# Patient Record
Sex: Female | Born: 1955 | Race: Black or African American | Hispanic: Yes | Marital: Married | Smoking: Former smoker
Health system: Southern US, Community
[De-identification: ages and names within clinical notes are randomized; demographics above are authoritative.]

## PROBLEM LIST (undated history)

## (undated) DIAGNOSIS — I639 Cerebral infarction, unspecified: Secondary | ICD-10-CM

## (undated) DIAGNOSIS — R51 Headache: Secondary | ICD-10-CM

## (undated) DIAGNOSIS — Z9889 Other specified postprocedural states: Secondary | ICD-10-CM

## (undated) DIAGNOSIS — I6529 Occlusion and stenosis of unspecified carotid artery: Secondary | ICD-10-CM

## (undated) DIAGNOSIS — R238 Other skin changes: Secondary | ICD-10-CM

## (undated) DIAGNOSIS — R42 Dizziness and giddiness: Secondary | ICD-10-CM

## (undated) DIAGNOSIS — E785 Hyperlipidemia, unspecified: Secondary | ICD-10-CM

## (undated) DIAGNOSIS — G459 Transient cerebral ischemic attack, unspecified: Secondary | ICD-10-CM

## (undated) DIAGNOSIS — R233 Spontaneous ecchymoses: Secondary | ICD-10-CM

## (undated) DIAGNOSIS — R112 Nausea with vomiting, unspecified: Secondary | ICD-10-CM

## (undated) HISTORY — PX: OTHER SURGICAL HISTORY: SHX169

## (undated) HISTORY — DX: Cerebral infarction, unspecified: I63.9

## (undated) HISTORY — DX: Occlusion and stenosis of unspecified carotid artery: I65.29

## (undated) HISTORY — PX: COLONOSCOPY: SHX174

---

## 1978-11-14 HISTORY — PX: DILATION AND CURETTAGE OF UTERUS: SHX78

## 2000-11-14 HISTORY — PX: ABDOMINAL HYSTERECTOMY: SHX81

## 2007-01-03 ENCOUNTER — Ambulatory Visit (HOSPITAL_BASED_OUTPATIENT_CLINIC_OR_DEPARTMENT_OTHER): Admission: RE | Admit: 2007-01-03 | Discharge: 2007-01-03 | Payer: Self-pay | Admitting: *Deleted

## 2007-03-19 ENCOUNTER — Ambulatory Visit: Payer: Self-pay | Admitting: Pain Medicine

## 2007-03-29 ENCOUNTER — Ambulatory Visit: Payer: Self-pay | Admitting: Pain Medicine

## 2007-04-05 ENCOUNTER — Ambulatory Visit: Payer: Self-pay | Admitting: Physician Assistant

## 2007-05-07 ENCOUNTER — Ambulatory Visit: Payer: Self-pay | Admitting: Physician Assistant

## 2007-05-22 ENCOUNTER — Ambulatory Visit: Payer: Self-pay | Admitting: Pain Medicine

## 2007-06-05 ENCOUNTER — Ambulatory Visit: Payer: Self-pay | Admitting: Pain Medicine

## 2007-06-20 ENCOUNTER — Ambulatory Visit: Payer: Self-pay | Admitting: Pain Medicine

## 2008-01-15 ENCOUNTER — Ambulatory Visit: Payer: Self-pay | Admitting: Vascular Surgery

## 2008-07-29 ENCOUNTER — Ambulatory Visit: Payer: Self-pay | Admitting: Vascular Surgery

## 2009-04-07 ENCOUNTER — Ambulatory Visit: Payer: Self-pay | Admitting: Vascular Surgery

## 2009-11-26 ENCOUNTER — Ambulatory Visit: Payer: Self-pay | Admitting: Vascular Surgery

## 2010-07-02 ENCOUNTER — Ambulatory Visit: Payer: Self-pay | Admitting: Vascular Surgery

## 2010-12-29 ENCOUNTER — Other Ambulatory Visit: Payer: Self-pay

## 2011-03-29 NOTE — Procedures (Signed)
CAROTID DUPLEX EXAM   INDICATION:  Followup possible FMD.   HISTORY:  Diabetes:  no  Cardiac:  no  Hypertension:  no  Smoking:  Previous  Previous Surgery:  no  CV History:  Asymptomatic; the patient hears blood flow in ears.  Amaurosis Fugax No, Paresthesias No, Hemiparesis No                                       RIGHT             LEFT  Brachial systolic pressure:         112               110  Brachial Doppler waveforms:         wnl               wnl  Vertebral direction of flow:        Antegrade         Antegrade  DUPLEX VELOCITIES (cm/sec)  CCA peak systolic                   96                112  ECA peak systolic                   108               101  ICA peak systolic                   P = 102, M/D = 278                  P = 58, M/D = 286  ICA end diastolic                   P = 54, M/D = 103 P = 22, M/D = 108  PLAQUE MORPHOLOGY:  PLAQUE AMOUNT:                      none              none  PLAQUE LOCATION:   IMPRESSION:  1. Bilateral internal carotid artery velocities are suggestive of high      end 60% to 79% stenosis however no plaque visualized.  2. Bilateral internal carotid artery velocities obtained today were      increased compared to previous.  3. Findings suggestive of fibromuscular dysplasia as previously      documented.   ___________________________________________  Di Kindle. Edilia Bo, M.D.   AS/MEDQ  D:  11/26/2009  T:  11/26/2009  Job:  [id number]

## 2011-03-29 NOTE — Assessment & Plan Note (Signed)
OFFICE VISIT   [name] [name], [name] [name]  DOB:  07-Mar-1956                                       07/29/2008  ZOXWR#:[id number]   I saw the patient in the office today for continued followup of her  carotid disease.  I had initially seen her in consultation on 01/15/2008  with a moderate right carotid stenosis in the 40-59% range.  I had  recommended a followup duplex scan in 6 months.  She was asymptomatic.   She returns for her 6 month followup visit.  She has had no history of  stroke, TIAs, expressive or receptive aphasia or amaurosis fugax.   REVIEW OF SYSTEMS:  On review of systems she has had no recent chest  pain, chest pressure, palpitations or arrhythmias.  She has had no  bronchitis, asthma or wheezing.   PHYSICAL EXAMINATION:  General:  On physical examination this is a  pleasant 55 year old woman who appears her stated age.  Vital signs:  Her blood pressure is 127/84, heart rate is 80.  Neck:  The neck is  supple.  There is no cervical lymphadenopathy.  I do not detect any  carotid bruits.  Lungs:  Are clear bilaterally to auscultation.  Cardiac:  She has a regular rate and rhythm.  Abdomen:  Soft, nontender.  Neurological:  Exam is nonfocal.   Carotid duplex scan shows that she has bilateral 40-59% carotid  stenoses.  The velocities on the left have increased compared to the  study 6 months ago.   I have explained we generally would not consider carotid endarterectomy  unless the stenoses progressed to greater than 80%.  I have recommended  followup duplex scan in 6 months and I will see her back at that time.  She knows to call sooner if she has problems.  In the meantime she knows  to continue taking her aspirin.   Di Kindle. Edilia Bo, M.D.  Electronically Signed   CSD/MEDQ  D:  07/29/2008  T:  07/31/2008  Job:  [id number]
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
## 2011-03-29 NOTE — Procedures (Signed)
CAROTID DUPLEX EXAM   INDICATION:  Follow up known carotid artery disease (FMD).   HISTORY:  Diabetes:  No.  Cardiac:  No.  Hypertension:  No.  Smoking:  Quit.  Previous Surgery:  CV History:  Amaurosis Fugax No, Paresthesias No, Hemiparesis No.                                       RIGHT             LEFT  Brachial systolic pressure:         116               114  Brachial Doppler waveforms:         Biphasic          Biphasic  Vertebral direction of flow:        Antegrade         Antegrade  DUPLEX VELOCITIES (cm/sec)  CCA peak systolic                   133               140  ECA peak systolic                   133               117  ICA peak systolic                   229 (mid)         176  ICA end diastolic                   88                71  PLAQUE MORPHOLOGY:                  None              None  PLAQUE AMOUNT:                      None              None  PLAQUE LOCATION:                    None              None   IMPRESSION:  1. 60-79% stenosis noted in the right internal carotid artery.  2. 40-59% stenosis noted in the left internal carotid artery.  3. Findings consistent with FMD since no plaque was seen.  4. Antegrade bilateral vertebral arteries.   ___________________________________________  Di Kindle. Edilia Bo, M.D.   MG/MEDQ  D:  04/07/2009  T:  04/07/2009  Job:  [id number]

## 2011-03-29 NOTE — Procedures (Signed)
CAROTID DUPLEX EXAM   INDICATION:  Follow up carotid disease consistent with fibromuscular  dysplasia.   HISTORY:  Diabetes:  No.  Cardiac:  No.  Hypertension:  No.  Smoking:  Previous.  Previous Surgery:  No.  CV History:  Complaint of occasional dizziness while at work.  Amaurosis Fugax No, Paresthesias No, Hemiparesis No                                       RIGHT             LEFT  Brachial systolic pressure:         110               110  Brachial Doppler waveforms:         Normal            Normal  Vertebral direction of flow:        Antegrade         Antegrade  DUPLEX VELOCITIES (cm/sec)  CCA peak systolic                   104               103  ECA peak systolic                   80                73  ICA peak systolic                   289 (mid)         275 (mid)  ICA end diastolic                   124               124  PLAQUE MORPHOLOGY:  PLAQUE AMOUNT:                      None              None  PLAQUE LOCATION:   IMPRESSION:  1. Doppler velocities suggest an 80-99% stenosis of the bilateral mid      internal carotid arteries.  This increase in velocity is most      likely due to possible fibromuscular dysplasia since no significant      plaque formation could be adequately visualized.  2. Increase in the percentage of stenosis category noted in the      bilateral internal carotid arteries when compared to the previous      exam on 11/26/2009.   ___________________________________________  Di Kindle. Edilia Bo, M.D.   CH/MEDQ  D:  07/05/2010  T:  07/05/2010  Job:  [id number]

## 2011-03-29 NOTE — Procedures (Signed)
CAROTID DUPLEX EXAM   INDICATION:  Carotid disease.   HISTORY:  Diabetes:  No.  Cardiac:  No.  Hypertension:  No.  Smoking:  Previous.  Previous Surgery:  No.  CV History:  No.  Amaurosis Fugax No, Paresthesias No, Hemiparesis No.                                       RIGHT             LEFT  Brachial systolic pressure:         128               120  Brachial Doppler waveforms:         Normal            Normal  Vertebral direction of flow:        Antegrade         Antegrade  DUPLEX VELOCITIES (cm/sec)  CCA peak systolic                   116               99  ECA peak systolic                   68                71  ICA peak systolic                   177               182  ICA end diastolic                   75                78  PLAQUE MORPHOLOGY:                  None              None  PLAQUE AMOUNT:                      None              None  PLAQUE LOCATION:                    None              None   IMPRESSION:  Doppler velocities suggest a 40-59% stenosis of the  bilateral mid internal carotid arteries; however, no focal plaque  formations were adequately visualized.  Color Doppler flow  characteristics suggest possible fibromuscular dysplasia.      ___________________________________________  Di Kindle. Edilia Bo, M.D.   CH/MEDQ  D:  07/29/2008  T:  07/29/2008  Job:  [id number]

## 2011-04-01 NOTE — Op Note (Signed)
NAMEZALMA, CHANNING             ACCOUNT NO.:  [id number]   MEDICAL RECORD NO.:  [id number]          PATIENT TYPE:  AMB   LOCATION:  DSC                          FACILITY:  [facility]   PHYSICIAN:  Tennis Must Meyerdierks, M.D.DATE OF BIRTH:  August 24, 1956   DATE OF PROCEDURE:  01/03/2007  DATE OF DISCHARGE:                               OPERATIVE REPORT   PREOPERATIVE DIAGNOSIS:  Extension contracture, left small finger.   POSTOPERATIVE DIAGNOSIS:  Extension contracture, left small finger.   PROCEDURE:  Extensor tenolysis with MP capsulotomy, left small finger.   SURGEON:  Lowell Bouton, M.D.   ANESTHESIA:  Axillary block.   OPERATIVE FINDINGS:  The patient had significant adherence of her  extensor mechanism to the plate on her fifth metacarpal.  The MP joint  was significantly scarred however, the surface appeared smooth.   PROCEDURE:  Under axillary block anesthesia, with a tourniquet on the  left arm, the left hand was prepped and draped in usual fashion and  after exsanguinating the limb, the tourniquet was inflated to 250 mmHg.  The previous incision was made longitudinally over the ulnar dorsal  aspect of the fifth metacarpal and then extended in a radial direction  at the MP joint across the MP joint dorsally.  Bleeding points were  coagulated and dissection was carried through the subcutaneous tissues.  There was significant scarring overlying the plate and this was released  sharply with a knife.  The extensor mechanism was freed up along the  entire length of the plate and then out to the proximal phalanx.  A  Freer elevator was used to completely free the extensor mechanism up and  performed the tenolysis.  This allowed improvement in the PIP motion;  however, the MP was totally stiff in extension.  A capsulotomy was then  performed sharply with a knife at the MP joint.  With manual flexion,  the adhesions were released at the MP joint and a Freer elevator  was  placed in the MP joint, making sure to release the volar recess.  This  allowed for full flexion of the MP joint and the PIP joint.  The wound  was then irrigated copiously with saline.  The subcutaneous tissue was  closed with 4-0 Vicryl and the skin with a 3-0 subcuticular Prolene.  Steri-Strips were applied followed by sterile dressings and a dorsal  splint with the MPs flexed.  The tourniquet was released with good  circulation of the hand.  The patient went to recovery room awake and  stable in good condition.      Lowell Bouton, M.D.  Electronically Signed     EMM/MEDQ  D:  01/03/2007  T:  01/03/2007  Job:  [id number]   cc:   Dr. Evelena Asa

## 2011-12-26 DIAGNOSIS — R072 Precordial pain: Secondary | ICD-10-CM

## 2012-08-06 ENCOUNTER — Other Ambulatory Visit: Payer: Self-pay | Admitting: *Deleted

## 2012-08-06 DIAGNOSIS — I6529 Occlusion and stenosis of unspecified carotid artery: Secondary | ICD-10-CM

## 2012-08-09 ENCOUNTER — Encounter: Payer: Self-pay | Admitting: Neurosurgery

## 2012-08-10 ENCOUNTER — Encounter: Payer: Self-pay | Admitting: Neurosurgery

## 2012-08-10 ENCOUNTER — Ambulatory Visit (INDEPENDENT_AMBULATORY_CARE_PROVIDER_SITE_OTHER): Payer: Managed Care, Other (non HMO) | Admitting: Neurosurgery

## 2012-08-10 ENCOUNTER — Other Ambulatory Visit (INDEPENDENT_AMBULATORY_CARE_PROVIDER_SITE_OTHER): Payer: Managed Care, Other (non HMO) | Admitting: *Deleted

## 2012-08-10 VITALS — BP 103/72 | HR 86 | Resp 16 | Ht 62.0 in | Wt 148.2 lb

## 2012-08-10 DIAGNOSIS — I7789 Other specified disorders of arteries and arterioles: Secondary | ICD-10-CM

## 2012-08-10 DIAGNOSIS — I773 Arterial fibromuscular dysplasia: Secondary | ICD-10-CM

## 2012-08-10 DIAGNOSIS — I6529 Occlusion and stenosis of unspecified carotid artery: Secondary | ICD-10-CM | POA: Insufficient documentation

## 2012-08-10 NOTE — Progress Notes (Signed)
[facility] Carotid Office Note  CC: Six-month carotid surveillance Referring Physician: Edilia [name]  History of Present Illness: 56 year old female patient of Dr. Adele [name] with known fibromuscular dysplasia bilaterally. The patient denies any signs or symptoms of CVA, TIA, amaurosis fugax or any neural deficit.  Past Medical History  Diagnosis Date  . Carotid artery occlusion     ROS: [x]  Positive   [ ]  Denies    General: [ ]  Weight loss, [ ]  Fever, [ ]  chills Neurologic: [ ]  Dizziness, [ ]  Blackouts, [ ]  Seizure [ ]  Stroke, [ ]  "Mini stroke", [ ]  Slurred speech, [ ]  Temporary blindness; [ ]  weakness in arms or legs, [ ]  Hoarseness Cardiac: [ ]  Chest pain/pressure, [ ]  Shortness of breath at rest [ ]  Shortness of breath with exertion, [ ]  Atrial fibrillation or irregular heartbeat Vascular: [ ]  Pain in legs with walking, [ ]  Pain in legs at rest, [ ]  Pain in legs at night,  [ ]  Non-healing ulcer, [ ]  Blood clot in vein/DVT,   Pulmonary: [ ]  Home oxygen, [ ]  Productive cough, [ ]  Coughing up blood, [ ]  Asthma,  [ ]  Wheezing Musculoskeletal:  [ ]  Arthritis, [ ]  Low back pain, [ ]  Joint pain Hematologic: [ ]  Easy Bruising, [ ]  Anemia; [ ]  Hepatitis Gastrointestinal: [ ]  Blood in stool, [ ]  Gastroesophageal Reflux/heartburn, [ ]  Trouble swallowing Urinary: [ ]  chronic Kidney disease, [ ]  on HD - [ ]  MWF or [ ]  TTHS, [ ]  Burning with urination, [ ]  Difficulty urinating Skin: [ ]  Rashes, [ ]  Wounds Psychological: [ ]  Anxiety, [ ]  Depression   Social History History  Substance Use Topics  . Smoking status: Never Smoker   . Smokeless tobacco: Not on file  . Alcohol Use: No    Family History Family History  Problem Relation Age of Onset  . Cancer Father     Prostate    Not on File  Current Outpatient Prescriptions  Medication Sig Dispense Refill  . aspirin 81 MG tablet Take 81 mg by mouth daily.      Marland Kitchen atorvastatin (LIPITOR) 40 MG tablet 40 mg  daily.       . Calcium Carbonate-Vitamin D (CALTRATE 600+D PO) Take by mouth daily.        Physical Examination  Filed Vitals:   08/10/12 1152  BP: 103/72  Pulse: 86  Resp:     Body mass index is 27.11 kg/(m^2).  General:  WDWN in NAD Gait: Normal HEENT: WNL Eyes: Pupils equal Pulmonary: normal non-labored breathing , without Rales, rhonchi,  wheezing Cardiac: RRR, without  Murmurs, rubs or gallops; Abdomen: soft, NT, no masses Skin: no rashes, ulcers noted  Vascular Exam Pulses: 3+ radial pulses bilaterally Carotid bruits: Carotid pulses to auscultation no bruits are heard Extremities without ischemic changes, no Gangrene , no cellulitis; no open wounds;  Musculoskeletal: no muscle wasting or atrophy   Neurologic: A&O X 3; Appropriate Affect ; SENSATION: normal; MOTOR FUNCTION:  moving all extremities equally. Speech is fluent/normal  Non-Invasive Vascular Imaging CAROTID DUPLEX 08/10/2012  Right ICA 60 - 79 % stenosis Left ICA 60 - 79 % stenosis Noted to be fibromuscular dysplasia, slight decrease bilaterally from previous exam  ASSESSMENT/PLAN: Asymptomatic patient with bilateral fibromuscular dysplasia. The patient will followup in 6 months with repeat carotid duplex. The patient's questions were encouraged and answered, she is in agreement with this plan.  Lauree Chandler ANP   Clinic MD:  Imogene Burn

## 2012-08-10 NOTE — Addendum Note (Signed)
Addended by: Sharee Pimple on: 08/10/2012 12:39 PM   Modules accepted: Orders

## 2013-02-06 ENCOUNTER — Ambulatory Visit: Payer: Managed Care, Other (non HMO) | Admitting: Neurosurgery

## 2013-02-06 ENCOUNTER — Other Ambulatory Visit: Payer: Managed Care, Other (non HMO)

## 2013-02-08 ENCOUNTER — Ambulatory Visit: Payer: Managed Care, Other (non HMO) | Admitting: Neurosurgery

## 2013-02-08 ENCOUNTER — Other Ambulatory Visit (INDEPENDENT_AMBULATORY_CARE_PROVIDER_SITE_OTHER): Payer: Managed Care, Other (non HMO) | Admitting: *Deleted

## 2013-02-08 DIAGNOSIS — I773 Arterial fibromuscular dysplasia: Secondary | ICD-10-CM

## 2013-02-08 DIAGNOSIS — I7789 Other specified disorders of arteries and arterioles: Secondary | ICD-10-CM

## 2013-02-11 ENCOUNTER — Other Ambulatory Visit: Payer: Self-pay | Admitting: *Deleted

## 2013-02-11 DIAGNOSIS — I7789 Other specified disorders of arteries and arterioles: Secondary | ICD-10-CM

## 2013-02-12 ENCOUNTER — Encounter: Payer: Self-pay | Admitting: Vascular Surgery

## 2013-08-14 ENCOUNTER — Other Ambulatory Visit (HOSPITAL_COMMUNITY): Payer: Self-pay | Admitting: Neurosurgery

## 2013-08-14 ENCOUNTER — Ambulatory Visit: Payer: Managed Care, Other (non HMO) | Admitting: Vascular Surgery

## 2013-08-14 ENCOUNTER — Other Ambulatory Visit: Payer: Managed Care, Other (non HMO)

## 2013-08-14 ENCOUNTER — Other Ambulatory Visit: Payer: Self-pay | Admitting: Neurosurgery

## 2013-08-14 DIAGNOSIS — D496 Neoplasm of unspecified behavior of brain: Secondary | ICD-10-CM

## 2013-08-27 ENCOUNTER — Encounter (HOSPITAL_COMMUNITY): Payer: Self-pay

## 2013-08-28 NOTE — Pre-Procedure Instructions (Signed)
Homer Pfeifer  08/28/2013   Your procedure is scheduled on: Tues, Oct 28 @ 7:30 AM  Report to [facility] Short Stay Entrance A at 5:30 AM.  Call this number if you have problems the morning of surgery: [phone]   Remember:   Do not eat food or drink liquids after midnight.   Take these medicines the morning of surgery with A SIP OF WATER: Keppra(Leviracetam)               Stop taking your Aspirin.No Goody's,BC's,Aleve,Ibuprofen,Fish Oil,or any Herbal Medications   Do not wear jewelry, make-up or nail polish.  Do not wear lotions, powders, or perfumes. You may wear deodorant.  Do not shave 48 hours prior to surgery.   Do not bring valuables to the hospital.  [facility] is not responsible                  for any belongings or valuables.               Contacts, dentures or bridgework may not be worn into surgery.  Leave suitcase in the car. After surgery it may be brought to your room.  For patients admitted to the hospital, discharge time is determined by your                treatment team.                   Special Instructions: Shower using CHG 2 nights before surgery and the night before surgery.  If you shower the day of surgery use CHG.  Use special wash - you have one bottle of CHG for all showers.  You should use approximately 1/3 of the bottle for each shower.   Please read over the following fact sheets that you were given: Pain Booklet, Coughing and Deep Breathing, Blood Transfusion Information, MRSA Information and Surgical Site Infection Prevention

## 2013-08-29 ENCOUNTER — Encounter (HOSPITAL_COMMUNITY): Payer: Self-pay

## 2013-08-29 ENCOUNTER — Encounter (HOSPITAL_COMMUNITY)
Admission: RE | Admit: 2013-08-29 | Discharge: 2013-08-29 | Disposition: A | Discharge status: Home / Self Care | Payer: Managed Care, Other (non HMO) | Source: Ambulatory Visit | Attending: Neurosurgery | Admitting: Neurosurgery

## 2013-08-29 ENCOUNTER — Encounter (HOSPITAL_COMMUNITY)
Admission: RE | Admit: 2013-08-29 | Discharge: 2013-08-29 | Disposition: A | Discharge status: Home / Self Care | Payer: Managed Care, Other (non HMO) | Source: Ambulatory Visit | Attending: Anesthesiology | Admitting: Anesthesiology

## 2013-08-29 DIAGNOSIS — Z01812 Encounter for preprocedural laboratory examination: Secondary | ICD-10-CM | POA: Insufficient documentation

## 2013-08-29 DIAGNOSIS — Z01818 Encounter for other preprocedural examination: Secondary | ICD-10-CM | POA: Insufficient documentation

## 2013-08-29 HISTORY — DX: Other specified postprocedural states: Z98.890

## 2013-08-29 HISTORY — DX: Headache: R51

## 2013-08-29 HISTORY — DX: Nausea with vomiting, unspecified: R11.2

## 2013-08-29 HISTORY — DX: Dizziness and giddiness: R42

## 2013-08-29 HISTORY — DX: Spontaneous ecchymoses: R23.3

## 2013-08-29 HISTORY — DX: Transient cerebral ischemic attack, unspecified: G45.9

## 2013-08-29 HISTORY — DX: Other skin changes: R23.8

## 2013-08-29 HISTORY — DX: Hyperlipidemia, unspecified: E78.5

## 2013-08-29 LAB — BASIC METABOLIC PANEL
CO2: 28 mEq/L (ref 19–32)
Calcium: 9.6 mg/dL (ref 8.4–10.5)
Chloride: 104 mEq/L (ref 96–112)
Creatinine, Ser: 0.75 mg/dL (ref 0.50–1.10)
GFR calc Af Amer: >90 mL/min (ref >90)
GFR calc non Af Amer: >90 mL/min (ref >90)
Sodium: 141 mEq/L (ref 135–145)

## 2013-08-29 LAB — CBC
HCT: 39.9 % (ref 36.0–46.0)
Hemoglobin: 13 g/dL (ref 12.0–15.0)
Platelets: 239 10*3/uL (ref 150–400)
RBC: 4.39 MIL/uL (ref 3.87–5.11)
RDW: 12.6 % (ref 11.5–15.5)
WBC: 4.7 10*3/uL (ref 4.0–10.5)

## 2013-08-29 LAB — PREPARE RBC (CROSSMATCH)

## 2013-08-29 LAB — ABO/RH: ABO/RH(D): A POS

## 2013-08-29 NOTE — Progress Notes (Signed)
Pt doesn't have a cardiologist  Stress test done 70yrs ago-to request from [facility]  Echo report in chart from [facility]  Denies ever having a heart cath  Medical Md is Dr.Burdine with [facility]  Denies EKG or CXR in past yr

## 2013-09-05 ENCOUNTER — Other Ambulatory Visit: Payer: Self-pay | Admitting: *Deleted

## 2013-09-06 ENCOUNTER — Ambulatory Visit
Admission: RE | Admit: 2013-09-06 | Discharge: 2013-09-06 | Disposition: A | Discharge status: Home / Self Care | Payer: Managed Care, Other (non HMO) | Source: Ambulatory Visit | Attending: Neurosurgery | Admitting: Neurosurgery

## 2013-09-06 DIAGNOSIS — D496 Neoplasm of unspecified behavior of brain: Secondary | ICD-10-CM

## 2013-09-06 MED ORDER — IOHEXOL 300 MG/ML  SOLN
75.0000 mL | Freq: Once | INTRAMUSCULAR | Status: AC | PRN
  Administered 2013-09-06: 75 mL via INTRAVENOUS

## 2013-09-09 MED ORDER — CEFAZOLIN SODIUM-DEXTROSE 2-3 GM-% IV SOLR
2.0000 g | INTRAVENOUS | Status: AC
  Administered 2013-09-10 (×2): 2 g via INTRAVENOUS
  Filled 2013-09-09: qty 50

## 2013-09-09 NOTE — Anesthesia Preprocedure Evaluation (Addendum)
Anesthesia Evaluation  Patient identified by MRN, date of birth, ID band Patient awake    History of Anesthesia Complications (+) PONV  Airway Mallampati: I TM Distance: >3 FB Neck ROM: Full    Dental   Pulmonary  breath sounds clear to auscultation        Cardiovascular Rhythm:Regular Rate:Normal  Carotid art disease   Neuro/Psych  Headaches, TIA   GI/Hepatic   Endo/Other    Renal/GU      Musculoskeletal   Abdominal   Peds  Hematology   Anesthesia Other Findings   Reproductive/Obstetrics                           Anesthesia Physical Anesthesia Plan  ASA: III  Anesthesia Plan: General   Post-op Pain Management:    Induction: Intravenous  Airway Management Planned: Oral ETT  Additional Equipment: Arterial line  Intra-op Plan:   Post-operative Plan: Extubation in OR  Informed Consent: I have reviewed the patients History and Physical, chart, labs and discussed the procedure including the risks, benefits and alternatives for the proposed anesthesia with the patient or authorized representative who has indicated his/her understanding and acceptance.     Plan Discussed with: CRNA and Surgeon  Anesthesia Plan Comments:         Anesthesia Quick Evaluation

## 2013-09-10 ENCOUNTER — Inpatient Hospital Stay (HOSPITAL_COMMUNITY)
Admission: RE | Admit: 2013-09-10 | Discharge: 2013-09-13 | DRG: 027 | Disposition: A | Discharge status: Home / Self Care | Payer: Managed Care, Other (non HMO) | Source: Ambulatory Visit | Attending: Neurosurgery | Admitting: Neurosurgery

## 2013-09-10 ENCOUNTER — Encounter (HOSPITAL_COMMUNITY)
Admission: RE | Disposition: A | Discharge status: Home / Self Care | Payer: Self-pay | Source: Ambulatory Visit | Attending: Neurosurgery

## 2013-09-10 ENCOUNTER — Inpatient Hospital Stay (HOSPITAL_COMMUNITY): Payer: Managed Care, Other (non HMO) | Admitting: Anesthesiology

## 2013-09-10 ENCOUNTER — Encounter (HOSPITAL_COMMUNITY): Payer: Managed Care, Other (non HMO) | Admitting: Anesthesiology

## 2013-09-10 ENCOUNTER — Encounter: Payer: Self-pay | Admitting: Vascular Surgery

## 2013-09-10 ENCOUNTER — Encounter (HOSPITAL_COMMUNITY): Payer: Self-pay | Admitting: *Deleted

## 2013-09-10 DIAGNOSIS — E785 Hyperlipidemia, unspecified: Secondary | ICD-10-CM | POA: Diagnosis present

## 2013-09-10 DIAGNOSIS — D329 Benign neoplasm of meninges, unspecified: Secondary | ICD-10-CM

## 2013-09-10 DIAGNOSIS — Z7982 Long term (current) use of aspirin: Secondary | ICD-10-CM

## 2013-09-10 DIAGNOSIS — Z79899 Other long term (current) drug therapy: Secondary | ICD-10-CM

## 2013-09-10 DIAGNOSIS — D320 Benign neoplasm of cerebral meninges: Principal | ICD-10-CM | POA: Diagnosis present

## 2013-09-10 HISTORY — PX: CRANIOTOMY: SHX93

## 2013-09-10 SURGERY — CRANIOTOMY TUMOR EXCISION
Anesthesia: General | Site: Head | Laterality: Right | Wound class: Clean

## 2013-09-10 MED ORDER — CEFAZOLIN SODIUM-DEXTROSE 2-3 GM-% IV SOLR
INTRAVENOUS | Status: AC
  Filled 2013-09-10: qty 50

## 2013-09-10 MED ORDER — THROMBIN 5000 UNITS EX SOLR
CUTANEOUS | Status: DC | PRN
  Administered 2013-09-10 (×2): 5000 [IU] via TOPICAL

## 2013-09-10 MED ORDER — FENTANYL CITRATE 0.05 MG/ML IJ SOLN: 50.0000 ug | Freq: Once | INTRAMUSCULAR | Status: DC

## 2013-09-10 MED ORDER — DEXAMETHASONE SODIUM PHOSPHATE 10 MG/ML IJ SOLN
INTRAMUSCULAR | Status: AC
  Administered 2013-09-10: 10 mg via INTRAVENOUS
  Filled 2013-09-10: qty 1

## 2013-09-10 MED ORDER — ARTIFICIAL TEARS OP OINT
TOPICAL_OINTMENT | OPHTHALMIC | Status: DC | PRN
  Administered 2013-09-10: 1 via OPHTHALMIC

## 2013-09-10 MED ORDER — ALBUMIN HUMAN 5 % IV SOLN
INTRAVENOUS | Status: DC | PRN
  Administered 2013-09-10: 11:00:00 via INTRAVENOUS

## 2013-09-10 MED ORDER — HYDROMORPHONE HCL PF 1 MG/ML IJ SOLN
INTRAMUSCULAR | Status: AC
  Administered 2013-09-10: 0.25 mg via INTRAVENOUS
  Filled 2013-09-10: qty 1

## 2013-09-10 MED ORDER — ONDANSETRON HCL 4 MG/2ML IJ SOLN
4.0000 mg | INTRAMUSCULAR | Status: DC | PRN
  Administered 2013-09-10: 4 mg via INTRAVENOUS
  Filled 2013-09-10: qty 2

## 2013-09-10 MED ORDER — SODIUM CHLORIDE 0.9 % IV SOLN
INTRAVENOUS | Status: DC
  Administered 2013-09-11: 1000 mL via INTRAVENOUS
  Administered 2013-09-11: 21:00:00 via INTRAVENOUS

## 2013-09-10 MED ORDER — MORPHINE SULFATE 2 MG/ML IJ SOLN
1.0000 mg | INTRAMUSCULAR | Status: DC | PRN
  Administered 2013-09-10 – 2013-09-11 (×3): 1 mg via INTRAVENOUS
  Filled 2013-09-10 (×2): qty 1

## 2013-09-10 MED ORDER — PROPOFOL 10 MG/ML IV BOLUS
INTRAVENOUS | Status: DC | PRN
  Administered 2013-09-10: 180 mg via INTRAVENOUS
  Administered 2013-09-10: 20 mg via INTRAVENOUS
  Administered 2013-09-10: 50 mg via INTRAVENOUS

## 2013-09-10 MED ORDER — VITAMIN D3 25 MCG (1000 UNIT) PO TABS
1000.0000 [IU] | ORAL_TABLET | Freq: Every day | ORAL | Status: DC
  Administered 2013-09-10 – 2013-09-13 (×4): 1000 [IU] via ORAL
  Filled 2013-09-10 (×4): qty 1

## 2013-09-10 MED ORDER — LABETALOL HCL 5 MG/ML IV SOLN
INTRAVENOUS | Status: DC | PRN
  Administered 2013-09-10 (×2): 2.5 mg via INTRAVENOUS

## 2013-09-10 MED ORDER — PANTOPRAZOLE SODIUM 40 MG IV SOLR
40.0000 mg | Freq: Every day | INTRAVENOUS | Status: DC
  Administered 2013-09-10: 40 mg via INTRAVENOUS
  Filled 2013-09-10 (×2): qty 40

## 2013-09-10 MED ORDER — ONDANSETRON HCL 4 MG/2ML IJ SOLN
INTRAMUSCULAR | Status: DC | PRN
  Administered 2013-09-10: 4 mg via INTRAVENOUS

## 2013-09-10 MED ORDER — HEPARIN SODIUM (PORCINE) 5000 UNIT/ML IJ SOLN
5000.0000 [IU] | Freq: Three times a day (TID) | INTRAMUSCULAR | Status: DC
  Administered 2013-09-11 – 2013-09-13 (×7): 5000 [IU] via SUBCUTANEOUS
  Filled 2013-09-10 (×10): qty 1

## 2013-09-10 MED ORDER — LIDOCAINE-EPINEPHRINE 1 %-1:100000 IJ SOLN
INTRAMUSCULAR | Status: DC | PRN
  Administered 2013-09-10: 7 mL

## 2013-09-10 MED ORDER — MIDAZOLAM HCL 2 MG/2ML IJ SOLN: 1.0000 mg | INTRAMUSCULAR | Status: DC | PRN

## 2013-09-10 MED ORDER — PHENYLEPHRINE HCL 10 MG/ML IJ SOLN
10.0000 mg | INTRAVENOUS | Status: DC | PRN
  Administered 2013-09-10: 20 ug/min via INTRAVENOUS

## 2013-09-10 MED ORDER — THROMBIN 5000 UNITS EX SOLR
OROMUCOSAL | Status: DC | PRN
  Administered 2013-09-10: 09:00:00 via TOPICAL

## 2013-09-10 MED ORDER — HYDROMORPHONE HCL PF 1 MG/ML IJ SOLN
0.2500 mg | INTRAMUSCULAR | Status: DC | PRN
  Administered 2013-09-10: 0.25 mg via INTRAVENOUS
  Administered 2013-09-10: 0.5 mg via INTRAVENOUS

## 2013-09-10 MED ORDER — ACETAMINOPHEN 325 MG PO TABS
650.0000 mg | ORAL_TABLET | ORAL | Status: DC | PRN
Start: 2013-09-10 — End: 2013-09-10

## 2013-09-10 MED ORDER — LACTATED RINGERS IV SOLN
INTRAVENOUS | Status: DC | PRN
  Administered 2013-09-10 (×2): via INTRAVENOUS

## 2013-09-10 MED ORDER — PHENYTOIN SODIUM 50 MG/ML IJ SOLN
100.0000 mg | Freq: Three times a day (TID) | INTRAMUSCULAR | Status: DC
  Administered 2013-09-10 – 2013-09-11 (×3): 100 mg via INTRAVENOUS
  Filled 2013-09-10 (×6): qty 2

## 2013-09-10 MED ORDER — ACETAMINOPHEN 500 MG PO TABS
1000.0000 mg | ORAL_TABLET | Freq: Four times a day (QID) | ORAL | Status: DC | PRN
  Administered 2013-09-10 – 2013-09-13 (×9): 1000 mg via ORAL
  Filled 2013-09-10 (×9): qty 2

## 2013-09-10 MED ORDER — LABETALOL HCL 5 MG/ML IV SOLN: 10.0000 mg | INTRAVENOUS | Status: DC | PRN

## 2013-09-10 MED ORDER — SODIUM CHLORIDE 0.9 % IV SOLN
1000.0000 mg | Freq: Once | INTRAVENOUS | Status: AC
  Administered 2013-09-10: 1 g via INTRAVENOUS
  Filled 2013-09-10: qty 20

## 2013-09-10 MED ORDER — LIDOCAINE HCL (CARDIAC) 20 MG/ML IV SOLN
INTRAVENOUS | Status: DC | PRN
  Administered 2013-09-10: 100 mg via INTRAVENOUS

## 2013-09-10 MED ORDER — OXYCODONE HCL 5 MG PO TABS: 5.0000 mg | ORAL_TABLET | Freq: Once | ORAL | Status: DC | PRN

## 2013-09-10 MED ORDER — GLYCOPYRROLATE 0.2 MG/ML IJ SOLN
INTRAMUSCULAR | Status: DC | PRN
  Administered 2013-09-10: .5 mg via INTRAVENOUS

## 2013-09-10 MED ORDER — PHENYLEPHRINE HCL 10 MG/ML IJ SOLN: 10.0000 mg | INTRAVENOUS | Status: DC | PRN

## 2013-09-10 MED ORDER — DEXAMETHASONE SODIUM PHOSPHATE 10 MG/ML IJ SOLN
6.0000 mg | Freq: Four times a day (QID) | INTRAMUSCULAR | Status: AC
  Administered 2013-09-10 – 2013-09-11 (×4): 6 mg via INTRAVENOUS
  Filled 2013-09-10 (×4): qty 1

## 2013-09-10 MED ORDER — SODIUM CHLORIDE 0.9 % IV SOLN
INTRAVENOUS | Status: DC | PRN
  Administered 2013-09-10: 07:00:00 via INTRAVENOUS

## 2013-09-10 MED ORDER — HEMOSTATIC AGENTS (NO CHARGE) OPTIME
TOPICAL | Status: DC | PRN
  Administered 2013-09-10: 1 via TOPICAL

## 2013-09-10 MED ORDER — FENTANYL CITRATE 0.05 MG/ML IJ SOLN
INTRAMUSCULAR | Status: DC | PRN
  Administered 2013-09-10: 100 ug via INTRAVENOUS
  Administered 2013-09-10: 50 ug via INTRAVENOUS
  Administered 2013-09-10: 100 ug via INTRAVENOUS
  Administered 2013-09-10 (×2): 50 ug via INTRAVENOUS

## 2013-09-10 MED ORDER — PHENYLEPHRINE HCL 10 MG/ML IJ SOLN
INTRAMUSCULAR | Status: DC | PRN
  Administered 2013-09-10 (×2): 40 ug via INTRAVENOUS
  Administered 2013-09-10 (×3): 80 ug via INTRAVENOUS

## 2013-09-10 MED ORDER — DEXAMETHASONE SODIUM PHOSPHATE 4 MG/ML IJ SOLN
4.0000 mg | Freq: Four times a day (QID) | INTRAMUSCULAR | Status: AC
  Administered 2013-09-11 – 2013-09-12 (×4): 4 mg via INTRAVENOUS
  Filled 2013-09-10 (×4): qty 1

## 2013-09-10 MED ORDER — INFLUENZA VAC SPLIT QUAD 0.5 ML IM SUSP
0.5000 mL | INTRAMUSCULAR | Status: DC
  Filled 2013-09-10: qty 0.5

## 2013-09-10 MED ORDER — DOCUSATE SODIUM 100 MG PO CAPS
100.0000 mg | ORAL_CAPSULE | Freq: Two times a day (BID) | ORAL | Status: DC
  Administered 2013-09-10 – 2013-09-13 (×5): 100 mg via ORAL
  Filled 2013-09-10 (×7): qty 1

## 2013-09-10 MED ORDER — SENNA 8.6 MG PO TABS
1.0000 | ORAL_TABLET | Freq: Two times a day (BID) | ORAL | Status: DC
  Administered 2013-09-10 – 2013-09-13 (×5): 8.6 mg via ORAL
  Filled 2013-09-10 (×8): qty 1

## 2013-09-10 MED ORDER — MORPHINE SULFATE 2 MG/ML IJ SOLN
INTRAMUSCULAR | Status: AC
  Filled 2013-09-10: qty 1

## 2013-09-10 MED ORDER — OXYCODONE HCL 5 MG/5ML PO SOLN: 5.0000 mg | Freq: Once | ORAL | Status: DC | PRN

## 2013-09-10 MED ORDER — THROMBIN 20000 UNITS EX SOLR
CUTANEOUS | Status: DC | PRN
  Administered 2013-09-10: 09:00:00 via TOPICAL

## 2013-09-10 MED ORDER — ATORVASTATIN CALCIUM 40 MG PO TABS
40.0000 mg | ORAL_TABLET | Freq: Every day | ORAL | Status: DC
  Administered 2013-09-10 – 2013-09-12 (×3): 40 mg via ORAL
  Filled 2013-09-10 (×4): qty 1

## 2013-09-10 MED ORDER — ACETAMINOPHEN 650 MG RE SUPP: 650.0000 mg | RECTAL | Status: DC | PRN

## 2013-09-10 MED ORDER — NEOSTIGMINE METHYLSULFATE 1 MG/ML IJ SOLN
INTRAMUSCULAR | Status: DC | PRN
  Administered 2013-09-10: 3.5 mg via INTRAVENOUS

## 2013-09-10 MED ORDER — PROMETHAZINE HCL 25 MG/ML IJ SOLN: 6.2500 mg | INTRAMUSCULAR | Status: DC | PRN

## 2013-09-10 MED ORDER — MANNITOL 20 % IV SOLN
INTRAVENOUS | Status: DC | PRN
  Administered 2013-09-10: 08:00:00 via INTRAVENOUS

## 2013-09-10 MED ORDER — ROCURONIUM BROMIDE 100 MG/10ML IV SOLN
INTRAVENOUS | Status: DC | PRN
  Administered 2013-09-10: 10 mg via INTRAVENOUS
  Administered 2013-09-10: 50 mg via INTRAVENOUS
  Administered 2013-09-10 (×2): 10 mg via INTRAVENOUS
  Administered 2013-09-10: 20 mg via INTRAVENOUS

## 2013-09-10 MED ORDER — 0.9 % SODIUM CHLORIDE (POUR BTL) OPTIME
TOPICAL | Status: DC | PRN
  Administered 2013-09-10 (×3): 1000 mL

## 2013-09-10 MED ORDER — DEXAMETHASONE SODIUM PHOSPHATE 4 MG/ML IJ SOLN
4.0000 mg | Freq: Three times a day (TID) | INTRAMUSCULAR | Status: DC
  Administered 2013-09-12 – 2013-09-13 (×3): 4 mg via INTRAVENOUS
  Filled 2013-09-10 (×6): qty 1

## 2013-09-10 MED ORDER — BACITRACIN ZINC 500 UNIT/GM EX OINT
TOPICAL_OINTMENT | CUTANEOUS | Status: DC | PRN
  Administered 2013-09-10 (×2): 1 via TOPICAL

## 2013-09-10 MED ORDER — CEFAZOLIN SODIUM 1-5 GM-% IV SOLN
1.0000 g | Freq: Three times a day (TID) | INTRAVENOUS | Status: AC
  Administered 2013-09-10 (×2): 1 g via INTRAVENOUS
  Filled 2013-09-10 (×2): qty 50

## 2013-09-10 MED ORDER — ONDANSETRON HCL 4 MG PO TABS: 4.0000 mg | ORAL_TABLET | ORAL | Status: DC | PRN

## 2013-09-10 SURGICAL SUPPLY — 105 items
0.5% SENSORCAINE (MPF) IMPLANT
APL SKNCLS STERI-STRIP NONHPOA (GAUZE/BANDAGES/DRESSINGS) ×1
BANDAGE GAUZE ELAST BULKY 4 IN (GAUZE/BANDAGES/DRESSINGS) IMPLANT
BENZOIN TINCTURE PRP APPL 2/3 (GAUZE/BANDAGES/DRESSINGS) ×1 IMPLANT
BLADE SAW GIGLI 16 STRL (MISCELLANEOUS) IMPLANT
BLADE SURG 15 STRL LF DISP TIS (BLADE) IMPLANT
BLADE SURG 15 STRL SS (BLADE)
BLADE SURG ROTATE 9660 (MISCELLANEOUS) ×2 IMPLANT
BLADE ULTRA TIP 2M (BLADE) ×2 IMPLANT
BRUSH SCRUB EZ 1% IODOPHOR (MISCELLANEOUS) ×1 IMPLANT
BUR ACORN 6.0 PRECISION (BURR) ×2 IMPLANT
BUR ADDG 1.1 (BURR) IMPLANT
BUR MATCHSTICK NEURO 3.0 LAGG (BURR) IMPLANT
BUR ROUTER D-58 CRANI (BURR) ×1 IMPLANT
CANISTER SUCT 3000ML (MISCELLANEOUS) ×2 IMPLANT
CATH VENTRIC 35X38 W/TROCAR LG (CATHETERS) IMPLANT
CLIP TI MEDIUM 6 (CLIP) ×1 IMPLANT
CONT SPEC 4OZ CLIKSEAL STRL BL (MISCELLANEOUS) ×5 IMPLANT
CORDS BIPOLAR (ELECTRODE) IMPLANT
COVER MAYO STAND STRL (DRAPES) ×1 IMPLANT
DECANTER SPIKE VIAL GLASS SM (MISCELLANEOUS) ×2 IMPLANT
DRAIN SNY WOU 7FLT (WOUND CARE) IMPLANT
DRAIN SUBARACHNOID (WOUND CARE) IMPLANT
DRAPE MICROSCOPE LEICA (MISCELLANEOUS) IMPLANT
DRAPE NEUROLOGICAL W/INCISE (DRAPES) ×2 IMPLANT
DRAPE ORTHO SPLIT 77X108 STRL (DRAPES)
DRAPE PROXIMA HALF (DRAPES) ×3 IMPLANT
DRAPE STERI IOBAN 125X83 (DRAPES) ×1 IMPLANT
DRAPE SURG 17X23 STRL (DRAPES) IMPLANT
DRAPE SURG IRRIG POUCH 19X23 (DRAPES) ×1 IMPLANT
DRAPE SURG ORHT 6 SPLT 77X108 (DRAPES) IMPLANT
DRAPE WARM FLUID 44X44 (DRAPE) ×2 IMPLANT
DRESSING TELFA 8X3 (GAUZE/BANDAGES/DRESSINGS) ×2 IMPLANT
DURAFORM SPONGE 2X2 SINGLE (Neuro Prosthesis/Implant) ×2 IMPLANT
DURAPREP 6ML APPLICATOR 50/CS (WOUND CARE) ×2 IMPLANT
ELECT CAUTERY BLADE 6.4 (BLADE) ×2 IMPLANT
ELECT REM PT RETURN 9FT ADLT (ELECTROSURGICAL) ×2
ELECTRODE REM PT RTRN 9FT ADLT (ELECTROSURGICAL) ×1 IMPLANT
EVACUATOR 1/8 PVC DRAIN (DRAIN) IMPLANT
EVACUATOR SILICONE 100CC (DRAIN) IMPLANT
FORCEPS BIPOLAR SPETZLER 8 1.0 (NEUROSURGERY SUPPLIES) ×1 IMPLANT
GAUZE SPONGE 4X4 16PLY XRAY LF (GAUZE/BANDAGES/DRESSINGS) IMPLANT
GLOVE BIO SURGEON STRL SZ8 (GLOVE) ×2 IMPLANT
GLOVE BIOGEL PI IND STRL 7.5 (GLOVE) IMPLANT
GLOVE BIOGEL PI IND STRL 8.5 (GLOVE) IMPLANT
GLOVE BIOGEL PI INDICATOR 7.5 (GLOVE) ×1
GLOVE BIOGEL PI INDICATOR 8.5 (GLOVE) ×1
GLOVE ECLIPSE 6.5 STRL STRAW (GLOVE) ×2 IMPLANT
GLOVE ECLIPSE 7.0 STRL STRAW (GLOVE) ×1 IMPLANT
GLOVE ECLIPSE 7.5 STRL STRAW (GLOVE) ×1 IMPLANT
GLOVE ECLIPSE 8.0 STRL XLNG CF (GLOVE) ×3 IMPLANT
GLOVE EXAM NITRILE LRG STRL (GLOVE) IMPLANT
GLOVE EXAM NITRILE MD LF STRL (GLOVE) IMPLANT
GLOVE EXAM NITRILE XL STR (GLOVE) IMPLANT
GLOVE EXAM NITRILE XS STR PU (GLOVE) IMPLANT
GLOVE INDICATOR 8.5 STRL (GLOVE) ×1 IMPLANT
GOWN BRE IMP SLV AUR LG STRL (GOWN DISPOSABLE) ×4 IMPLANT
GOWN BRE IMP SLV AUR XL STRL (GOWN DISPOSABLE) IMPLANT
GOWN STRL REIN 2XL LVL4 (GOWN DISPOSABLE) ×1 IMPLANT
HEMOSTAT POWDER SURGIFOAM 1G (HEMOSTASIS) ×1 IMPLANT
HEMOSTAT SURGICEL 2X14 (HEMOSTASIS) IMPLANT
KIT BASIN OR (CUSTOM PROCEDURE TRAY) ×2 IMPLANT
KIT DRAIN CSF ACCUDRAIN (MISCELLANEOUS) IMPLANT
KIT ROOM TURNOVER OR (KITS) ×2 IMPLANT
MARKER SKIN DUAL TIP RULER LAB (MISCELLANEOUS) ×1 IMPLANT
MARKER SPHERE PSV REFLC NDI (MISCELLANEOUS) ×2 IMPLANT
NDL SPNL 18GX3.5 QUINCKE PK (NEEDLE) IMPLANT
NEEDLE HYPO 25X1 1.5 SAFETY (NEEDLE) ×2 IMPLANT
NEEDLE SPNL 18GX3.5 QUINCKE PK (NEEDLE) IMPLANT
NS IRRIG 1000ML POUR BTL (IV SOLUTION) ×2 IMPLANT
PACK CRANIOTOMY (CUSTOM PROCEDURE TRAY) ×2 IMPLANT
PAD EYE OVAL STERILE LF (GAUZE/BANDAGES/DRESSINGS) IMPLANT
PATTIES SURGICAL .25X.25 (GAUZE/BANDAGES/DRESSINGS) IMPLANT
PATTIES SURGICAL .5 X.5 (GAUZE/BANDAGES/DRESSINGS) ×2 IMPLANT
PATTIES SURGICAL .5 X3 (DISPOSABLE) ×2 IMPLANT
PATTIES SURGICAL 1/4 X 3 (GAUZE/BANDAGES/DRESSINGS) IMPLANT
PATTIES SURGICAL 1X1 (DISPOSABLE) IMPLANT
PERFORATOR LRG  14-11MM (BIT) ×1
PERFORATOR LRG 14-11MM (BIT) IMPLANT
PLATE 1.5/0.5 18.5MM BURR HOLE (Plate) ×6 IMPLANT
RUBBERBAND STERILE (MISCELLANEOUS) IMPLANT
SCREW SELF DRILL HT 1.5/4MM (Screw) ×10 IMPLANT
SLEEVE SURGEON STRL (DRAPES) ×2 IMPLANT
SPECIMEN JAR SMALL (MISCELLANEOUS) IMPLANT
SPONGE GAUZE 4X4 12PLY (GAUZE/BANDAGES/DRESSINGS) ×2 IMPLANT
SPONGE NEURO XRAY DETECT 1X3 (DISPOSABLE) ×2 IMPLANT
SPONGE SURGIFOAM ABS GEL 100 (HEMOSTASIS) ×2 IMPLANT
SPONGE SURGIFOAM ABS GEL SZ50 (HEMOSTASIS) ×1 IMPLANT
STAPLER VISISTAT 35W (STAPLE) ×2 IMPLANT
SUT ETHILON 3 0 FSL (SUTURE) IMPLANT
SUT ETHILON 3 0 PS 1 (SUTURE) IMPLANT
SUT NURALON 4 0 TR CR/8 (SUTURE) ×5 IMPLANT
SUT SILK 0 TIES 10X30 (SUTURE) IMPLANT
SUT VIC AB 2-0 CT2 18 VCP726D (SUTURE) ×4 IMPLANT
SUT VIC AB 3-0 SH 8-18 (SUTURE) ×2 IMPLANT
SYR 20ML ECCENTRIC (SYRINGE) ×2 IMPLANT
SYR CONTROL 10ML LL (SYRINGE) ×1 IMPLANT
TAPE CLOTH SURG 4X10 WHT LF (GAUZE/BANDAGES/DRESSINGS) ×1 IMPLANT
TIP SONASTAR STD MISONIX 1.9 (TRAY / TRAY PROCEDURE) IMPLANT
TOWEL OR 17X24 6PK STRL BLUE (TOWEL DISPOSABLE) ×2 IMPLANT
TOWEL OR 17X26 10 PK STRL BLUE (TOWEL DISPOSABLE) ×2 IMPLANT
TRAY FOLEY CATH 14FRSI W/METER (CATHETERS) ×2 IMPLANT
TUBE CONNECTING 12X1/4 (SUCTIONS) ×2 IMPLANT
UNDERPAD 30X30 INCONTINENT (UNDERPADS AND DIAPERS) ×1 IMPLANT
WATER STERILE IRR 1000ML POUR (IV SOLUTION) ×2 IMPLANT

## 2013-09-10 NOTE — Anesthesia Postprocedure Evaluation (Signed)
  Anesthesia Post-op Note  Patient: [name] [name]  Procedure(s) Performed: Procedure(s) with comments: Craniotomy for tumor excision (Right) - Craniotomy for meningioma with stealth  Patient Location: PACU  Anesthesia Type:General  Level of Consciousness: awake and alert   Airway and Oxygen Therapy: Patient Spontanous Breathing  Post-op Pain: mild  Post-op Assessment: Post-op Vital signs reviewed, Patient's Cardiovascular Status Stable, Respiratory Function Stable, Patent Airway, No signs of Nausea or vomiting and Pain level controlled  Post-op Vital Signs: Reviewed and stable  Complications: No apparent anesthesia complications

## 2013-09-10 NOTE — Anesthesia Procedure Notes (Signed)
Procedure Name: Intubation Date/Time: 09/10/2013 7:50 AM Performed by: Lisbeth Renshaw Pre-anesthesia Checklist: Patient identified, Timeout performed, Emergency Drugs available, Suction available and Patient being monitored Patient Re-evaluated:Patient Re-evaluated prior to inductionOxygen Delivery Method: Circle system utilized and Simple face mask Preoxygenation: Pre-oxygenation with 100% oxygen Intubation Type: IV induction Ventilation: Mask ventilation without difficulty Laryngoscope Size: Miller and 2 Grade View: Grade I Tube type: Oral Tube size: 7.0 mm Number of attempts: 1 Airway Equipment and Method: Stylet,  LTA kit utilized and Patient positioned with wedge pillow Placement Confirmation: ETT inserted through vocal cords under direct vision,  positive ETCO2 and breath sounds checked- equal and bilateral Secured at: 22 cm Tube secured with: Tape Dental Injury: Teeth and Oropharynx as per pre-operative assessment

## 2013-09-10 NOTE — Transfer of Care (Signed)
Immediate Anesthesia Transfer of Care Note  Patient: [name] [name]  Procedure(s) Performed: Procedure(s) with comments: Craniotomy for tumor excision (Right) - Craniotomy for meningioma with stealth  Patient Location: PACU  Anesthesia Type:General  Level of Consciousness: awake and alert   Airway & Oxygen Therapy: Patient Spontanous Breathing and Patient connected to nasal cannula oxygen  Post-op Assessment: Report given to PACU RN and Post -op Vital signs reviewed and stable  Post vital signs: Reviewed and stable  Complications: No apparent anesthesia complications

## 2013-09-10 NOTE — H&P (Signed)
HISTORY OF PRESENT ILLNESS: 1.  Brain tumor   [name] [name] is a 57 year old woman who presented to the office after an episode of left-sided numbness a few weeks ago.  She states she was in her kitchen cooking some food, when she noticed sudden onset of a tingling sensation which began in her left foot and traveled up her left leg into the left side of her body and arm.  The patient works as a Designer, jewellery, and was concern for stroke.  She therefore called EMS and was taken to the hospital.  By the time she reached the hospital, the tingling sensation had resolved, with some residual numbness in her left thumb.  She states overall the tingling sensation lasted for approximately 5 minutes.  Bilateral hospital, she was worked up for possible transient ischemic attack including carotid duplex, echocardiogram, and CT of the head.  CT demonstrated a right cerebral convexity lesion and MRI was done with contrast has demonstrated the likely meningioma.  Upon questioning, the patient does not describe any previous seizure-like episodes, headaches, visual changes, or new weakness, numbness, or tingling     PAST MEDICAL/SURGICAL HISTORY  (Detailed)  Disease/disorder Onset Date Management Date Comments    Hand surgery    Hyperlipidemia          PAST MEDICAL HISTORY, SURGICAL HISTORY, FAMILY HISTORY, SOCIAL HISTORY AND REVIEW OF SYSTEMS I have reviewed the patient's past medical, surgical, family and social history as well as the comprehensive review of systems as included on the [facility] history form dated 08/14/2013, which I have signed.  Family History  (Detailed)  Relationship Family Member Name Deceased Age at Death Condition Onset Age Cause of Death      Family history of COPD  N   SOCIAL HISTORY  (Detailed) Tobacco use reviewed. Preferred language is Unknown.   Smoking status: Never smoker.  SMOKING STATUS Use Status Type Smoking Status Usage Per Day Years  Used Total Pack Years  no/never  Never smoker             Medications (added, continued or stopped this visit):   Medication Dose Prescribed Else Ind Started Stopped  aspirin 325 mg tablet 325 mg Y    atorvastatin 40 mg tablet 40 mg Y    Flonase 50 mcg/actuation nasal spray,suspension 50 mcg Y    Keppra 500 mg tablet 500 mg N 08/14/2013      Allergies:  Ingredient Reaction Medication Name Comment  NO KNOWN ALLERGIES     No known allergies.  REVIEW OF SYSTEMS: System Neg/Pos Details  Constitutional Negative Chills, fatigue, fever, malaise, night sweats, weight gain and weight loss.  ENMT Negative Ear drainage, hearing loss, nasal drainage, otalgia, sinus pressure and sore throat.  ENMT Positive Nose bleeds.  Eyes Negative Eye discharge and eye pain.  Eyes Positive Vision changes.  Respiratory Negative Chronic cough, cough, dyspnea, known TB exposure and wheezing.  Cardio Positive Hyperlipidemia.  Cardio Negative Chest pain, claudication, edema and irregular heartbeat/palpitations.  GI Negative Abdominal pain, blood in stool, change in stool pattern, constipation, decreased appetite, diarrhea, heartburn, nausea and vomiting.  Endocrine Negative Cold intolerance, heat intolerance, polydipsia and polyphagia.  Neuro Positive Numbness in extremities.  Neuro Negative Dizziness, extremity weakness, gait disturbance, headache, memory impairment, numbness in extremities, seizures and tremors.  Psych Negative Anxiety, depression and insomnia.  Integumentary Negative Brittle hair, brittle nails, change in shape/size of mole(s), hair loss, hirsutism, hives, pruritus, rash and skin lesion.  MS  Negative Back pain, joint pain, joint swelling, muscle weakness and neck pain.  Hema/Lymph Negative Easy bleeding, easy bruising and lymphadenopathy.  Allergic/Immuno Negative Contact allergy, environmental allergies, food allergies and seasonal allergies.  Reproductive Negative Breast  discharge, breast lump(s), dysmenorrhea, dyspareunia, history of abnormal PAP smear and vaginal discharge.  GU Negative Dysuria, hematuria, hot flashes, irregular menses, polyuria, urinary frequency, urinary incontinence and urinary retention.    Vitals BP 143/83  Pulse 95  Temp(Src) 98.1 F (36.7 C) (Oral)  Resp 18  SpO2 100%    PHYSICAL EXAM General General Appearance: normal Mood/Affect: normal Orientation: normal Pulses/Edema: 2+ bilateral radial / DP pulses Gait/Station: non-antalgic, normal heel and toe walking, normal tandem gait Coordination: normal    Skin Right Upper Extremity: normal Left Upper Extremity: normal Right Lower Extremity: normal Left Lower Extremity: normal  Inspection/Palpation   Right Left  Lumbar Spine: normal normal Upper Extremity: normal normal Lower Extremity: normal normal  Stability Cervical Spine: normal Right Upper Extremity: normal Left Upper Extremity: normal Right Lower Extremity: normal Left Lower Extremity: normal  Range of Motion Cervical Spine: normal Right Upper Extremity: normal Left Upper Extremity: normal Right Lower Extremity: normal Left Lower Extremity: normal  Motor Strength Upper and lower extremity motor strength was tested in the clinically pertinent muscles .Any abnormal findings will be noted below..   Right Left Deltoid: normal normal Biceps: normal normal Triceps: normal normal Infraspinatus: normal normal Wrist Extensor: normal normal Grip: normal normal Hip Flexor: normal normal Knee Extensor: normal normal Tib Anterior: normal normal EHL: normal normal Medial Gastroc: normal normal  Sensory Sensation was tested at C5 to T1 and L2 to S1 .Any abnormal findings will be noted below..  Right Left C5: normal normal  C6: normal normal C7: normal normal C8: normal normal T1: normal normal Median Hand: normal normal   Ulnar  Hand: normal normal   L2: normal normal  L3: normal normal  L4: normal normal  L5: normal normal  S1: normal normal  Motor and other Tests    Right Left Hoffman's: absent absent Babinski: downgoing downgoing  Muscle Stretch Reflexes Upper and lower extremity reflexes were tested in the clinically pertinent muscles .Any abnormal findings will be noted below..  Right Left Bicep: normal normal Brachioradialis: normal normal Patellar: normal normal Achilles: normal normal       DIAGNOSTIC RESULTS MRI of the brain with and without contrast was reviewed.  This demonstrates an approximately 4 cm right posterior frontal parasagittal convexity meningioma.  This does not appear to invade the superior sagittal sinus, and the sinus appears patent.  The meningioma appears to be overlying the central sulcus, and the pre -and post-central gyri.    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  Patient is on an anti-coagulant, anti-inflammatory or supplement that may increase bleeding time. Patient advised to stop medicine prior to surgery.    Assessment/Plan # Detail Type Description   1. Assessment Meningioma (225.2).        - Right frontal craniotomy for resection of tumor  Treatment options for this meningioma were discussed, including expected observation, and surgical resection.  I talked to the patient and her children about observation, with the risk that seizures could continue or worsen, and the meningioma could grow and make future surgical resection more difficult.  I also talked to them about craniotomy for resection of the tumor.  I told him the risks of this procedure including bleeding and infection possibly  requiring blood transfusion, as well as the risk of left-sided hemiparesis or hemiplegia.  I also talked to him about the risk of postoperative seizure.  In addition, I explained to them that there  is the possibility that the tumor cannot be completely removed safely and the possibility for tumor recurrence exists.  The patient and her son and daughter understood our discussion and all their questions were answered.  Knowing the risks of the procedure, they are willing to proceed with surgical resection.

## 2013-09-10 NOTE — Preoperative (Signed)
Beta Blockers   Reason not to administer Beta Blockers:Not Applicable 

## 2013-09-10 NOTE — Op Note (Addendum)
PREOP DIAGNOSIS: Right frontal meningioma  POSTOP DIAGNOSIS: Same  PROCEDURE: 1. Stereotactic Right frontal craniotomy for resection of meningioma 2. Use of operating microscope for microdissection  SURGEON: Dr. Lisbeth Renshaw, MD  ASSISTANT: Dr. Maeola Harman, MD  ANESTHESIA: General Endotracheal  EBL: 250cc  SPECIMENS: Right frontal meningioma for frozen and permanent section   DRAINS: None  COMPLICATIONS: None immediate  CONDITION: Hemodynamically stable to PACU  HISTORY: [name] [name] is a 57 y.o. female who presented with a sensory seizure. Workup included CT and MRI of the brain which demonstrated a homogeneously enhancing parasagittal convexity tumor likely meningioma. Risks and benefits of treatment options were discussed and the patient elected to proceed with surgical resection.  PROCEDURE IN DETAIL: After informed consent was obtained and witnessed, the patient was brought to the operating room. After induction of general anesthesia, the 3-point Mayfield head holder was affixed to the patient she was transferred to the operative table and positioned in the supine position. All pressure points were meticulously padded. Using the preoperative stereotactic CT scan, skin markers were registered with the skin until satisfactory accuracy was achieved. Using the stereotactic system, the margins of the tumor were identified as was the superior sagittal sinus. Skin incision was then marked out to access the entirety of the tumor. The region was then clipped prepped and draped in the usual sterile fashion.  After time-out was conducted, skin incision was made sharply and Bovie electrocautery was used to dissect the subcutaneous tissue and galea. Raney clips were then used to secure hemostasis on the skin edges. The sagittal suture was identified, and the stereotactic system was used to mark out the lateral, anterior, and posterior edges of the tumor. Using the 14 mm perforator bit,  2 bur holes were created on the midline over the superior sagittal sinus. A third bur hole was created lateral to the lateral tumor margin. Bur holes were then connected with the craniotome and craniotomy flap elevated. Bleeding from the dura was controlled with a combination of bipolar electrocautery and FloSeal.  The dura was then opened lateral to the tumor margin and we immediately encountered tumor underneath. Dural opening was then continued in a horseshoe fashion towards the midline both anteriorly and posteriorly. Dural stay stitches were then placed and the tumor was retracted medially. At this point the microscope was draped sterilely and brought into the field, and the remainder of the case was done under the microscope using microdissection.  Using a combination of bipolar electrocautery, microdissectors, and gentle retraction, the borders of the tumor were identified in relation to the surrounding brain parenchyma. A portion of the tumor was sent for frozen pathology which returned as likely meningioma without atypical or high grade features. The tumor was then slowly retracted away from the surrounding brain beginning at the lateral margin working towards the anterior margin and then towards the posterior margin. There was not a good arachnoid plan between tumor and the brain, and dissection of the tumor away from the surrounding parenchyma did cause some violation of the pia and brain itself. As the inferior portion of the tumor was identified I began to work medially and the falx was identified. At this point the tumor was then rolled in an anterior to posterior direction and the majority of the tumor was removed and sent for permanent pathology. A large cortical draining vein identified at the posterior margin of the tumor was preserved and seen to be draining into the superior sagittal sinus. A small portion of  tumor adherent to this vein was slowly dissected free and removed. The dural  attachments of the tumor were then identified and coagulated using the aquamantys system.  At this point the wound is irrigated with copious amounts of normal saline irrigation. Good hemostasis was confirmed on the brain surface. The dura was then laid over the defect, and hemostasis was achieved over the superior sagittal sinus using a combination of FloSeal and large strips of Gelfoam. Dural onlay graft was then used to cover the dural defect. The galea was closed using interrupted 3-0 Vicryl sutures. The skin was closed using standard surgical skin staples. Sterile dressing was then applied and the Mayfield head holder was removed. The patient was then transferred to the stretcher and taken to the PACU in stable hemodynamic condition.  At the end of the case all sponge, needle, and instrument counts were correct.

## 2013-09-11 ENCOUNTER — Encounter (HOSPITAL_COMMUNITY): Payer: Self-pay | Admitting: Neurosurgery

## 2013-09-11 LAB — CBC
HCT: 30.6 % — ABNORMAL LOW (ref 36.0–46.0)
Hemoglobin: 10.6 g/dL — ABNORMAL LOW (ref 12.0–15.0)
MCH: 30.9 pg (ref 26.0–34.0)
Platelets: 188 10*3/uL (ref 150–400)
RBC: 3.43 MIL/uL — ABNORMAL LOW (ref 3.87–5.11)
RDW: 12.9 % (ref 11.5–15.5)
WBC: 12.6 10*3/uL — ABNORMAL HIGH (ref 4.0–10.5)

## 2013-09-11 LAB — BASIC METABOLIC PANEL
CO2: 24 mEq/L (ref 19–32)
Calcium: 8.9 mg/dL (ref 8.4–10.5)
GFR calc non Af Amer: >90 mL/min (ref >90)
Glucose, Bld: 176 mg/dL — ABNORMAL HIGH (ref 70–99)
Potassium: 3.3 mEq/L — ABNORMAL LOW (ref 3.5–5.1)
Sodium: 139 mEq/L (ref 135–145)

## 2013-09-11 LAB — PHENYTOIN LEVEL, TOTAL: Phenytoin Lvl: 14 ug/mL (ref 10.0–20.0)

## 2013-09-11 MED ORDER — PANTOPRAZOLE SODIUM 40 MG PO TBEC
40.0000 mg | DELAYED_RELEASE_TABLET | Freq: Every day | ORAL | Status: DC
  Administered 2013-09-11 – 2013-09-13 (×3): 40 mg via ORAL
  Filled 2013-09-11 (×3): qty 1

## 2013-09-11 MED ORDER — PHENYTOIN SODIUM EXTENDED 100 MG PO CAPS
100.0000 mg | ORAL_CAPSULE | Freq: Three times a day (TID) | ORAL | Status: DC
  Administered 2013-09-11 – 2013-09-13 (×7): 100 mg via ORAL
  Filled 2013-09-11 (×9): qty 1

## 2013-09-11 NOTE — Progress Notes (Signed)
Pt seen and examined. No issues overnight. C/o mild HA relieved by current po meds. No visual changes, reports improved RLE strength.  EXAM: Temp:  [97.7 F (36.5 C)-99.5 F (37.5 C)] 99.4 F (37.4 C) (10/29 0800) Pulse Rate:  [70-99] 93 (10/29 0800) Resp:  [9-21] 17 (10/29 0800) BP: (101-125)/(49-74) 101/58 mmHg (10/29 0800) SpO2:  [93 %-100 %] 96 % (10/29 0800) Arterial Line BP: (65-159)/(55-71) 72/66 mmHg (10/29 0800) Weight:  [71.1 kg (156 lb 12 oz)] 71.1 kg (156 lb 12 oz) (10/28 1415) Intake/Output     10/28 0701 - 10/29 0700 10/29 0701 - 10/30 0700   I.V. (mL/kg) 2350 (33.1) 100 (1.4)   IV Piggyback 550    Total Intake(mL/kg) 2900 (40.8) 100 (1.4)   Urine (mL/kg/hr) 3125 (1.8) 225 (0.9)   Emesis/NG output 300 (0.2)    Blood 250 (0.1)    Total Output 3675 225   Net -775 -125         Awake, alert, oriented Speech fluent CN intact 5/5 BUE, BLE x 4/5 L DF Wound c/d/i  LABS: Lab Results  Component Value Date   CREATININE 0.65 09/11/2013   BUN 9 09/11/2013   NA 139 09/11/2013   K 3.3* 09/11/2013   CL 106 09/11/2013   CO2 24 09/11/2013   Lab Results  Component Value Date   WBC 12.6* 09/11/2013   HGB 10.6* 09/11/2013   HCT 30.6* 09/11/2013   MCV 89.2 09/11/2013   PLT 188 09/11/2013    IMPRESSION: - 57 y.o. female POD#1 s/p right frontal crani for meningioma, neurologically will with minimal L DF weakness  PLAN: - Transfer to floor - OOB with PT/OT - PO meds - Postop MRI w Gd today - Cont dilantin  - Decadron taper

## 2013-09-12 ENCOUNTER — Inpatient Hospital Stay (HOSPITAL_COMMUNITY): Payer: Managed Care, Other (non HMO)

## 2013-09-12 MED ORDER — GADOBENATE DIMEGLUMINE 529 MG/ML IV SOLN
15.0000 mL | Freq: Once | INTRAVENOUS | Status: AC
  Administered 2013-09-12: 14 mL via INTRAVENOUS

## 2013-09-12 NOTE — Evaluation (Signed)
Physical Therapy Evaluation Patient Details Name: [name] [name] MRN: [id number] DOB: 10/28/1956 Today's Date: 09/12/2013 Time: 8295-6213 PT Time Calculation (min): 25 min  PT Assessment / Plan / Recommendation History of Present Illness  pt presents with 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    Clinical Impression  Pt very motivated to increase mobility.  Pt would benefit from OT eval for UE and ADLs.  Will continue to follow.      PT Assessment  Patient needs continued PT services    Follow Up Recommendations  Outpatient PT;Supervision for mobility/OOB    Does the patient have the potential to tolerate intense rehabilitation      Barriers to Discharge        Equipment Recommendations  None recommended by PT    Recommendations for Other Services OT consult   Frequency Min 3X/week    Precautions / Restrictions Precautions Precautions: Fall Restrictions Weight Bearing Restrictions: No   Pertinent Vitals/Pain Indicates feeling pulse in head when first coming to sit.        Mobility  Bed Mobility Bed Mobility: Supine to Sit;Sitting - Scoot to Edge of Bed Supine to Sit: 5: Supervision;With rails Sitting - Scoot to Edge of Bed: 5: Supervision Details for Bed Mobility Assistance: pt moves slowly and utilizes rail to A with coming to sit.   Transfers Transfers: Sit to Stand;Stand to Sit Sit to Stand: 4: Min guard;With upper extremity assist;From bed Stand to Sit: 4: Min guard;With upper extremity assist;To chair/3-in-1 Details for Transfer Assistance: cues for UE use and to attempt use of L UE.   Ambulation/Gait Ambulation/Gait Assistance: 4: Min guard Ambulation Distance (Feet): 150 Feet Assistive device: Rolling walker Ambulation/Gait Assistance Details: cues for increased heel strike L LE and knee extension in stance.   Gait Pattern: Step-through pattern;Decreased step length - right;Decreased stance time - left;Decreased dorsiflexion - left Stairs: No Wheelchair  Mobility Wheelchair Mobility: No    Exercises     PT Diagnosis: Difficulty walking  PT Problem List: Decreased strength;Decreased activity tolerance;Decreased balance;Decreased mobility;Decreased coordination;Decreased knowledge of use of DME PT Treatment Interventions: DME instruction;Gait training;Stair training;Functional mobility training;Therapeutic activities;Therapeutic exercise;Balance training;Neuromuscular re-education;Patient/family education     PT Goals(Current goals can be found in the care plan section) Acute Rehab PT Goals Patient Stated Goal: Back to normal.   PT Goal Formulation: With patient Time For Goal Achievement: 09/26/13 Potential to Achieve Goals: Good  Visit Information  Last PT Received On: 09/12/13 Assistance Needed: +1 History of Present Illness: pt presents with 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         Prior Functioning  Home Living Family/patient expects to be discharged to:: Private residence Living Arrangements: Spouse/significant other Available Help at Discharge: Family;Available 24 hours/day Type of Home: House Home Access: Stairs to enter [employer] of Steps: couple Home Layout: One level Home Equipment: None Prior Function Level of Independence: Independent Communication Communication: No difficulties    Cognition  Cognition Arousal/Alertness: Awake/alert Behavior During Therapy: WFL for tasks assessed/performed Overall Cognitive Status: Within Functional Limits for tasks assessed    Extremity/Trunk Assessment Upper Extremity Assessment Upper Extremity Assessment: Defer to OT evaluation Lower Extremity Assessment Lower Extremity Assessment: LLE deficits/detail LLE Deficits / Details: Strength grossly 4- - 4/5.   LLE Coordination: decreased fine motor   Balance Balance Balance Assessed: Yes Static Standing Balance Static Standing - Balance Support: No upper extremity supported Static Standing - Level of  Assistance: 5: Stand by assistance  End of Session PT - End of Session  Equipment Utilized During Treatment: Gait belt Activity Tolerance: Patient tolerated treatment well Patient left: in chair;with call bell/phone within reach Nurse Communication: Mobility status  GP     Sunny Schlein, [place] [phone] 09/12/2013, 2:49 PM

## 2013-09-12 NOTE — Progress Notes (Signed)
Patient returned from MRI, removed stapled telfa dressing from head per verbal order from [place]. Staples under dressing to head left OTA with no active drainage noted. Dried drainage was noted to skin, staple line and hair surrounding surgical site.  [name] [name]

## 2013-09-13 LAB — TYPE AND SCREEN
ABO/RH(D): A POS
Unit division: 0

## 2013-09-13 MED ORDER — PHENYTOIN SODIUM EXTENDED 100 MG PO CAPS: 100.0000 mg | ORAL_CAPSULE | Freq: Three times a day (TID) | ORAL | Status: DC

## 2013-09-13 NOTE — Care Management Note (Signed)
    Page 1 of 1   09/13/2013     12:10:02 PM   CARE MANAGEMENT NOTE 09/13/2013  Patient:  [name],[name]   Account Number:  [id number]  Date Initiated:  09/13/2013  Documentation initiated by:  Elmer Bales  Subjective/Objective Assessment:   Patient was admitted right frontal craniotomy with tumor resection.     Action/Plan:   Will follow for discharge needs.   Anticipated DC Date:  09/13/2013   Anticipated DC Plan:  HOME/SELF CARE      DC Planning Services  CM consult      Choice offered to / List presented to:  C-1 Patient           Status of service:  Completed, signed off Medicare Important Message given?   (If response is "NO", the following Medicare IM given date fields will be blank) Date Medicare IM given:   Date Additional Medicare IM given:    Discharge Disposition:  HOME/SELF CARE  Per UR Regulation:  Reviewed for med. necessity/level of care/duration of stay  If discussed at Long Length of Stay Meetings, dates discussed:    Comments:  09/13/13 1045 Elmer Bales RN, MSN, CM- Met with patient and husband to discuss outpatient physical and occupational therapy. Patient is requesting a rehab center that is close to her home.  Patient prefers [facility], ph# [phone].  Referral was accepted and orders were faxed to [phone]. Information was entered into the AVS for discharge.
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
## 2013-09-13 NOTE — Progress Notes (Signed)
Seen and agreed 09/13/2013 Robinette, Julia Elizabeth PTA [phone] pager [phone] office    

## 2013-09-13 NOTE — Discharge Summary (Signed)
   Physician Discharge Summary  Patient ID: [name] [name] MRN: [id number] DOB/AGE: 01-22-1956 57 y.o.  Admit date: 09/10/2013 Discharge date: 09/13/2013  Admission Diagnoses: Right frontal Meningioma  Discharge Diagnoses: Same Active Problems:   * No active hospital problems. *   Discharged Condition: Stable  Hospital Course:  Mrs. [name] [name] is a 57 y.o. female electively admitted for resection of her right frontal meningioma. Postoperatively, she was taken to the NeuroICU where she was found to be at her baseline with the exception of mild RLE and RUE weakness. She was transferred to the neuroscience floor and continued to recover. On POD #2, MRI was done demonstrating no residual meningioma. Her RLE weakness significantly improved and she was ambulating well and seen by PT and recommended to have outpatient PT. She was then deemed stable for D/C.  Treatments: Surgery - Right frontal craniotomy for resection of tumor.  Discharge Exam: Blood pressure 112/65, pulse 95, temperature 98.5 F (36.9 C), temperature source Oral, resp. rate 16, height 5\' 3"  (1.6 m), weight 71.1 kg (156 lb 12 oz), SpO2 98.00%. Awake, alert, oriented Speech fluent, appropriate CN grossly intact 5/5 BUE/BLE x 4+/5 L delt Wound c/d/i  Follow-up: Follow-up in my office [facility] [phone]) in 2-3 weeks  Disposition: Home  Discharge Orders   Future Appointments Provider Department Dept Phone   09/03/2014 3:00 PM [facility] [address] [phone]   09/03/2014 4:00 PM Chuck Hint, MD [facility] [phone]   Future Orders Complete By Expires   Ambulatory referral to Occupational Therapy  As directed    Ambulatory referral to Physical Therapy  As directed    Questions:     Iontophoresis - 4 mg/ml of dexamethasone:  No   T.E.N.S. Unit Evaluation and Dispense as Indicated:  No       Medication List     STOP taking these medications       aspirin 325 MG tablet     levETIRAcetam 500 MG tablet  Commonly known as:  KEPPRA      TAKE these medications       atorvastatin 40 MG tablet  Commonly known as:  LIPITOR  Take 40 mg by mouth daily.     phenytoin 100 MG ER capsule  Commonly known as:  DILANTIN  Take 1 capsule (100 mg total) by mouth 3 (three) times daily.     Vitamin D3 2000 UNITS Tabs  Take 1 tablet by mouth daily.         SignedLisbeth Renshaw, C 09/13/2013, 7:46 AM

## 2013-09-13 NOTE — Progress Notes (Signed)
Patient discharged home with spouse. Script and discharge instructions given to patient and spouse for home precaution and safety. RN notified patient the importance of follow up appointment with MD. Patient and family stated understanding of instruction given.

## 2013-09-13 NOTE — Progress Notes (Signed)
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     PT Comments   Recommend RW for home.  Pt progressing well with ambulation.  Pt able to complete stair training this session and pt and husband educated on safe technique.    Follow Up Recommendations  Outpatient PT;Supervision for mobility/OOB     Does the patient have the potential to tolerate intense rehabilitation     Barriers to Discharge        Equipment Recommendations  None recommended by PT    Recommendations for Other Services OT consult  Frequency Min 3X/week   Progress towards PT Goals Progress towards PT goals: Progressing toward goals  Plan Current plan remains appropriate    Precautions / Restrictions Precautions Precautions: Fall Restrictions Weight Bearing Restrictions: No   Pertinent Vitals/Pain No complaints of pain today    Mobility  Bed Mobility Bed Mobility: Not assessed Details for Bed Mobility Assistance: Pt received in chair Transfers Transfers: Sit to Stand;Stand to Sit Sit to Stand: 4: Min guard Stand to Sit: 5: Supervision Details for Transfer Assistance: cues for hand placement for safety Ambulation/Gait Ambulation/Gait Assistance: 5: Supervision Ambulation Distance (Feet): 300 Feet Assistive device: Rolling walker Ambulation/Gait Assistance Details: Cues to stay inside of walker and erect posture and to increase right step length Gait Pattern: Step-through pattern;Decreased step length - right;Decreased stance time - left Gait velocity: decreased Stairs: Yes Stairs Assistance: 4: Min assist Stairs Assistance Details (indicate cue type and reason): Hand held assistance for balance and safety Stair Management Technique: No rails Number of Stairs:  5 Wheelchair Mobility Wheelchair Mobility: No    Exercises     PT Diagnosis:    PT Problem List:   PT Treatment Interventions:     PT Goals (current goals can now be found in the care plan section)    Visit Information  Last PT Received On: 09/13/13 Assistance Needed: +1 History of Present Illness: pt presents with 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      Subjective Data      Cognition  Cognition Arousal/Alertness: Awake/alert Behavior During Therapy: WFL for tasks assessed/performed Overall Cognitive Status: Within Functional Limits for tasks assessed    Balance     End of Session PT - End of Session Equipment Utilized During Treatment: Gait belt Activity Tolerance: Patient tolerated treatment well Patient left: in chair;with family/visitor present;with call bell/phone within reach Nurse Communication: Mobility status   GP     [name] [name], [name] [name] 09/13/2013, 9:15 AM

## 2013-09-14 LAB — PHENYTOIN LEVEL, FREE AND TOTAL
Phenytoin Bound: 13.5 mg/L
Phenytoin, Free: 1.3 mg/L (ref 1.0–2.0)
Phenytoin, Total: 14.8 mg/L (ref 10.0–20.0)

## 2014-02-12 DIAGNOSIS — M79643 Pain in unspecified hand: Secondary | ICD-10-CM | POA: Insufficient documentation

## 2014-03-20 DIAGNOSIS — M546 Pain in thoracic spine: Secondary | ICD-10-CM | POA: Insufficient documentation

## 2014-06-10 DIAGNOSIS — R000 Tachycardia, unspecified: Secondary | ICD-10-CM | POA: Insufficient documentation

## 2014-09-03 ENCOUNTER — Ambulatory Visit: Payer: Managed Care, Other (non HMO) | Admitting: Vascular Surgery

## 2014-09-03 ENCOUNTER — Other Ambulatory Visit (HOSPITAL_COMMUNITY): Payer: Managed Care, Other (non HMO)

## 2014-09-17 ENCOUNTER — Ambulatory Visit: Payer: Managed Care, Other (non HMO) | Admitting: Vascular Surgery

## 2014-09-17 ENCOUNTER — Other Ambulatory Visit (HOSPITAL_COMMUNITY): Payer: Managed Care, Other (non HMO)

## 2014-10-14 ENCOUNTER — Encounter: Payer: Self-pay | Admitting: Vascular Surgery

## 2014-10-15 ENCOUNTER — Ambulatory Visit: Payer: Managed Care, Other (non HMO) | Admitting: Vascular Surgery

## 2014-10-15 ENCOUNTER — Other Ambulatory Visit (HOSPITAL_COMMUNITY): Payer: Managed Care, Other (non HMO)

## 2015-04-06 ENCOUNTER — Encounter: Payer: Self-pay | Admitting: Vascular Surgery

## 2015-04-07 ENCOUNTER — Other Ambulatory Visit: Payer: Self-pay | Admitting: *Deleted

## 2015-04-07 ENCOUNTER — Encounter: Payer: Self-pay | Admitting: Vascular Surgery

## 2015-04-07 DIAGNOSIS — I773 Arterial fibromuscular dysplasia: Secondary | ICD-10-CM

## 2015-04-08 ENCOUNTER — Ambulatory Visit (HOSPITAL_COMMUNITY)
Admission: RE | Admit: 2015-04-08 | Discharge: 2015-04-08 | Disposition: A | Discharge status: Home / Self Care | Payer: Managed Care, Other (non HMO) | Source: Ambulatory Visit | Attending: Vascular Surgery | Admitting: Vascular Surgery

## 2015-04-08 ENCOUNTER — Ambulatory Visit (INDEPENDENT_AMBULATORY_CARE_PROVIDER_SITE_OTHER): Payer: Managed Care, Other (non HMO) | Admitting: Vascular Surgery

## 2015-04-08 ENCOUNTER — Encounter: Payer: Self-pay | Admitting: Vascular Surgery

## 2015-04-08 VITALS — BP 136/71 | HR 84 | Ht 63.0 in | Wt 162.7 lb

## 2015-04-08 DIAGNOSIS — I773 Arterial fibromuscular dysplasia: Secondary | ICD-10-CM | POA: Diagnosis not present

## 2015-04-08 DIAGNOSIS — I6523 Occlusion and stenosis of bilateral carotid arteries: Secondary | ICD-10-CM
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
## 2015-04-08 NOTE — Addendum Note (Signed)
Addended by: Dorthula Rue L on: 04/08/2015 02:54 PM   Modules accepted: Orders

## 2015-09-23 ENCOUNTER — Emergency Department (HOSPITAL_COMMUNITY)
Admission: EM | Admit: 2015-09-23 | Discharge: 2015-09-23 | Disposition: A | Discharge status: Home / Self Care | Payer: Managed Care, Other (non HMO) | Attending: Emergency Medicine | Admitting: Emergency Medicine

## 2015-09-23 ENCOUNTER — Encounter (HOSPITAL_COMMUNITY): Payer: Self-pay

## 2015-09-23 ENCOUNTER — Emergency Department (HOSPITAL_COMMUNITY): Payer: Managed Care, Other (non HMO)

## 2015-09-23 DIAGNOSIS — R000 Tachycardia, unspecified: Secondary | ICD-10-CM | POA: Insufficient documentation

## 2015-09-23 DIAGNOSIS — Z79899 Other long term (current) drug therapy: Secondary | ICD-10-CM | POA: Insufficient documentation

## 2015-09-23 DIAGNOSIS — Z7982 Long term (current) use of aspirin: Secondary | ICD-10-CM | POA: Insufficient documentation

## 2015-09-23 DIAGNOSIS — Z8679 Personal history of other diseases of the circulatory system: Secondary | ICD-10-CM | POA: Diagnosis not present

## 2015-09-23 DIAGNOSIS — Z87891 Personal history of nicotine dependence: Secondary | ICD-10-CM | POA: Diagnosis not present

## 2015-09-23 DIAGNOSIS — R1013 Epigastric pain: Secondary | ICD-10-CM | POA: Insufficient documentation

## 2015-09-23 DIAGNOSIS — Z8673 Personal history of transient ischemic attack (TIA), and cerebral infarction without residual deficits: Secondary | ICD-10-CM | POA: Insufficient documentation

## 2015-09-23 DIAGNOSIS — E785 Hyperlipidemia, unspecified: Secondary | ICD-10-CM | POA: Diagnosis not present

## 2015-09-23 LAB — BASIC METABOLIC PANEL
Anion gap: 9 (ref 5–15)
BUN: 13 mg/dL (ref 6–20)
CO2: 24 mmol/L (ref 22–32)
Calcium: 9.7 mg/dL (ref 8.9–10.3)
Chloride: 107 mmol/L (ref 101–111)
Creatinine, Ser: 0.79 mg/dL (ref 0.44–1.00)
GFR calc Af Amer: >60 mL/min (ref >60)
Glucose, Bld: 117 mg/dL — ABNORMAL HIGH (ref 65–99)
Potassium: 3.4 mmol/L — ABNORMAL LOW (ref 3.5–5.1)
Sodium: 140 mmol/L (ref 135–145)

## 2015-09-23 LAB — CBC WITH DIFFERENTIAL/PLATELET
BASOS ABS: 0 10*3/uL (ref 0.0–0.1)
Basophils Relative: 1 %
Eosinophils Absolute: 0.1 10*3/uL (ref 0.0–0.7)
Eosinophils Relative: 1 %
HEMATOCRIT: 41.8 % (ref 36.0–46.0)
Hemoglobin: 13.5 g/dL (ref 12.0–15.0)
Lymphocytes Relative: 48 %
Lymphs Abs: 3.1 10*3/uL (ref 0.7–4.0)
MCH: 29.7 pg (ref 26.0–34.0)
MCHC: 32.3 g/dL (ref 30.0–36.0)
MCV: 91.9 fL (ref 78.0–100.0)
MONO ABS: 0.5 10*3/uL (ref 0.1–1.0)
Monocytes Relative: 7 %
Neutro Abs: 2.7 10*3/uL (ref 1.7–7.7)
Neutrophils Relative %: 43 %
Platelets: 254 10*3/uL (ref 150–400)
RBC: 4.55 MIL/uL (ref 3.87–5.11)
RDW: 12.4 % (ref 11.5–15.5)
WBC: 6.3 10*3/uL (ref 4.0–10.5)

## 2015-09-23 LAB — TSH: TSH: 2.123 u[IU]/mL (ref 0.350–4.500)

## 2015-09-23 LAB — TROPONIN I

## 2015-09-23 NOTE — ED Provider Notes (Signed)
CSN: [id number]     Arrival date & time 09/23/15  4627 History  By signing my name below, I, Tula Nakayama, attest that this documentation has been prepared under the direction and in the presence of Nat Christen, MD.  Electronically Signed: Tula Nakayama, ED Scribe. 09/23/2015. 9:34 AM.   Chief Complaint  Patient presents with  . Tachycardia   The history is provided by the patient. No language interpreter was used.    HPI Comments: Everleigh Colclasure is a 59 y.o. female with a history of tachycardia who presents to the Emergency Department complaining of intermittent elevated heart rate, measuring 143 at its highest, that started last night. She reports fullness of her epigastrium as an associated symptom. Pt has a history of tachycardia and was last hospitalized for the same 3 months ago. She takes Potassium-20 meqs every other day and metoprolol 12.5 mg daily. She also takes Protonix, atorvastatin-40 mg, Vitamin D and Aspirin 80-mg daily. Pt has been previously evaluated for tachycardia with Holter monitor, but no cause for symptoms was identified. 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 She denies SOB and CP.   PCP [place]  Past Medical History  Diagnosis Date  . Carotid artery occlusion   . Hyperlipidemia     takes Atorvastatin daily  . PONV (postoperative nausea and vomiting)   . Headache(784.0)   . Dizziness   . TIA (transient ischemic attack)   . Bruises easily   . Stroke [facility])    Past Surgical History  Procedure Laterality Date  . Dilation and curettage of uterus  1980  . Left hand surgery  2007 and 2008    plates and screws  . Abdominal hysterectomy  2002  . Colonoscopy    . Craniotomy Right 09/10/2013    Procedure: Craniotomy for tumor excision;  Surgeon: Consuella Lose, MD;  Location: [facility];  Service: Neurosurgery;  Laterality: Right;  Craniotomy for meningioma with stealth   Family History  Problem Relation Age of Onset  . Cancer Father      Prostate  . Deep vein thrombosis Father    Social History  Substance Use Topics  . Smoking status: Former Research scientist (life sciences)  . Smokeless tobacco: None     Comment: quit 19yrs ago  . Alcohol Use: No   OB History    No data available     Review of Systems  Respiratory: Negative for shortness of breath.   Cardiovascular: Negative for chest pain.  Gastrointestinal: Positive for abdominal pain.  All other systems reviewed and are negative.  Allergies  Review of patient's allergies indicates no known allergies.  Home Medications   Prior to Admission medications   Medication Sig Start Date End Date Taking? Authorizing Provider  acetaminophen (TYLENOL) 325 MG tablet Take 650 mg by mouth every 6 (six) hours as needed for moderate pain or headache.   Yes Historical Provider, MD  aspirin 81 MG tablet Take 81 mg by mouth daily.   Yes Historical Provider, MD  atorvastatin (LIPITOR) 40 MG tablet Take 40 mg by mouth daily.  06/27/12  Yes Historical Provider, MD  Cholecalciferol (VITAMIN D3) 2000 UNITS TABS Take 1 tablet by mouth daily.   Yes Historical Provider, MD  ibuprofen (ADVIL,MOTRIN) 200 MG tablet Take 200-400 mg by mouth every 6 (six) hours as needed for headache or moderate pain.   Yes Historical Provider, MD  metoprolol succinate (TOPROL-XL) 25 MG 24 hr tablet Take 12.5 mg by mouth daily.   Yes  Historical Provider, MD  pantoprazole (PROTONIX) 40 MG tablet Take 40 mg by mouth daily.   Yes Historical Provider, MD  potassium chloride SA (K-DUR,KLOR-CON) 20 MEQ tablet Take 20 mEq by mouth every other day.   Yes Historical Provider, MD   BP 126/70 mmHg  Pulse 85  Temp(Src) 97.9 F (36.6 C) (Oral)  Resp 14  Ht 5\' 3"  (1.6 m)  Wt 167 lb (75.751 kg)  BMI 29.59 kg/m2  SpO2 100% Physical Exam  Constitutional: She appears well-developed and well-nourished. No distress.  HENT:  Head: Normocephalic and atraumatic.  Eyes: Conjunctivae and EOM are normal.  Neck: Neck supple. No tracheal deviation  present.  Cardiovascular: Regular rhythm and normal heart sounds.   Mild tachycardia  Pulmonary/Chest: Effort normal and breath sounds normal. No respiratory distress. She has no wheezes.  Abdominal: Soft. There is no tenderness.  Skin: Skin is warm and dry.  Psychiatric: She has a normal mood and affect. Her behavior is normal.  Nursing note and vitals reviewed.   ED Course  Procedures  DIAGNOSTIC STUDIES: Oxygen Saturation is 100% on RA, normal by my interpretation.    COORDINATION OF CARE: 9:35 AM Discussed treatment plan with pt which includes an EKG, lab work and a chest x-ray. Pt agreed to plan.  Labs Review Labs Reviewed  BASIC METABOLIC PANEL - Abnormal; Notable for the following:    Potassium 3.4 (*)    Glucose, Bld 117 (*)    All other components within normal limits  CBC WITH DIFFERENTIAL/PLATELET  TROPONIN I  TSH    Imaging Review Dg Chest Portable 1 View  09/23/2015  CLINICAL DATA:  Tachycardia. EXAM: PORTABLE CHEST 1 VIEW COMPARISON:  07/19/2015 FINDINGS: The heart size and mediastinal contours are within normal limits. Both lungs are clear. The visualized skeletal structures are unremarkable. IMPRESSION: Normal exam. Electronically Signed   By: Lorriane Shire M.D.   On: 09/23/2015 09:28   I have personally reviewed and evaluated these images and lab results as part of my medical decision-making.   EKG Interpretation   Date/Time:  Wednesday September 23 2015 08:48:34 EST Ventricular Rate:  109 PR Interval:  167 QRS Duration: 92 QT Interval:  326 QTC Calculation: 439 R Axis:   56 Text Interpretation:  Sinus tachycardia Ventricular premature complex  Posterior infarct, old Baseline wander in lead(s) V5 Confirmed by Mykayla Brinton   MD, Brant Peets ([id number]) on 09/23/2015 9:11:58 AM      MDM   Final diagnoses:  Tachycardia    Patient is in no acute distress. Hemoglobin, glucose, TSH all normal. EKG shows rate of 109. Patient has been tachycardic in the past.    I  personally performed the services described in this documentation, which was scribed in my presence. The recorded information has been reviewed and is accurate.        Nat Christen, MD 09/23/15 1136

## 2015-09-23 NOTE — ED Notes (Signed)
Pt reports has tachycardia.  Reports HR has been elevated since last night but worse this morning.  C/O lightheadedness and nausea.

## 2015-09-23 NOTE — Discharge Instructions (Signed)
Tests were good.   Follow-up your primary care doctor. °

## 2016-04-13 ENCOUNTER — Ambulatory Visit: Payer: Managed Care, Other (non HMO) | Admitting: Family

## 2016-04-13 ENCOUNTER — Encounter (HOSPITAL_COMMUNITY): Payer: Self-pay

## 2016-05-27 ENCOUNTER — Encounter: Payer: Self-pay | Admitting: Family

## 2016-06-02 ENCOUNTER — Encounter (HOSPITAL_COMMUNITY): Payer: Self-pay

## 2016-06-02 ENCOUNTER — Encounter: Payer: Self-pay | Admitting: Family

## 2016-06-03 ENCOUNTER — Ambulatory Visit: Payer: Managed Care, Other (non HMO) | Admitting: Family

## 2016-06-03 ENCOUNTER — Inpatient Hospital Stay (HOSPITAL_COMMUNITY)
Admission: RE | Admit: 2016-06-03 | Payer: Managed Care, Other (non HMO) | Source: Ambulatory Visit | Attending: Vascular Surgery | Admitting: Vascular Surgery

## 2016-06-08 ENCOUNTER — Ambulatory Visit: Payer: Managed Care, Other (non HMO) | Admitting: Family

## 2016-06-10 ENCOUNTER — Ambulatory Visit: Payer: Managed Care, Other (non HMO) | Admitting: Family

## 2016-06-28 ENCOUNTER — Encounter: Payer: Self-pay | Admitting: Family

## 2016-07-01 ENCOUNTER — Encounter: Payer: Self-pay | Admitting: Family

## 2016-07-01 ENCOUNTER — Ambulatory Visit (INDEPENDENT_AMBULATORY_CARE_PROVIDER_SITE_OTHER): Payer: Managed Care, Other (non HMO) | Admitting: Family

## 2016-07-01 ENCOUNTER — Other Ambulatory Visit: Payer: Self-pay | Admitting: *Deleted

## 2016-07-01 ENCOUNTER — Ambulatory Visit (HOSPITAL_COMMUNITY)
Admission: RE | Admit: 2016-07-01 | Discharge: 2016-07-01 | Disposition: A | Discharge status: Home / Self Care | Payer: Managed Care, Other (non HMO) | Source: Ambulatory Visit | Attending: Family | Admitting: Family

## 2016-07-01 VITALS — BP 131/86 | HR 66 | Temp 98.2°F | Resp 16 | Ht 63.0 in | Wt 161.0 lb

## 2016-07-01 DIAGNOSIS — I773 Arterial fibromuscular dysplasia: Secondary | ICD-10-CM

## 2016-07-01 DIAGNOSIS — I6523 Occlusion and stenosis of bilateral carotid arteries: Secondary | ICD-10-CM | POA: Diagnosis present

## 2016-07-01 DIAGNOSIS — E785 Hyperlipidemia, unspecified: Secondary | ICD-10-CM | POA: Insufficient documentation
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
## 2016-07-01 NOTE — Patient Instructions (Signed)
Stroke Prevention Some medical conditions and behaviors are associated with an increased chance of having a stroke. You may prevent a stroke by making healthy choices and managing medical conditions. HOW CAN I REDUCE MY RISK OF HAVING A STROKE?   Stay physically active. Get at least 30 minutes of activity on most or all days.  Do not smoke. It may also be helpful to avoid exposure to secondhand smoke.  Limit alcohol use. Moderate alcohol use is considered to be:  No more than 2 drinks per day for men.  No more than 1 drink per day for nonpregnant women.  Eat healthy foods. This involves:  Eating 5 or more servings of fruits and vegetables a day.  Making dietary changes that address high blood pressure (hypertension), high cholesterol, diabetes, or obesity.  Manage your cholesterol levels.  Making food choices that are high in fiber and low in saturated fat, trans fat, and cholesterol may control cholesterol levels.  Take any prescribed medicines to control cholesterol as directed by your health care provider.  Manage your diabetes.  Controlling your carbohydrate and sugar intake is recommended to manage diabetes.  Take any prescribed medicines to control diabetes as directed by your health care provider.  Control your hypertension.  Making food choices that are low in salt (sodium), saturated fat, trans fat, and cholesterol is recommended to manage hypertension.  Ask your health care provider if you need treatment to lower your blood pressure. Take any prescribed medicines to control hypertension as directed by your health care provider.  If you are 18-39 years of age, have your blood pressure checked every 3-5 years. If you are 40 years of age or older, have your blood pressure checked every year.  Maintain a healthy weight.  Reducing calorie intake and making food choices that are low in sodium, saturated fat, trans fat, and cholesterol are recommended to manage  weight.  Stop drug abuse.  Avoid taking birth control pills.  Talk to your health care provider about the risks of taking birth control pills if you are over 35 years old, smoke, get migraines, or have ever had a blood clot.  Get evaluated for sleep disorders (sleep apnea).  Talk to your health care provider about getting a sleep evaluation if you snore a lot or have excessive sleepiness.  Take medicines only as directed by your health care provider.  For some people, aspirin or blood thinners (anticoagulants) are helpful in reducing the risk of forming abnormal blood clots that can lead to stroke. If you have the irregular heart rhythm of atrial fibrillation, you should be on a blood thinner unless there is a good reason you cannot take them.  Understand all your medicine instructions.  Make sure that other conditions (such as anemia or atherosclerosis) are addressed. SEEK IMMEDIATE MEDICAL CARE IF:   You have sudden weakness or numbness of the face, arm, or leg, especially on one side of the body.  Your face or eyelid droops to one side.  You have sudden confusion.  You have trouble speaking (aphasia) or understanding.  You have sudden trouble seeing in one or both eyes.  You have sudden trouble walking.  You have dizziness.  You have a loss of balance or coordination.  You have a sudden, severe headache with no known cause.  You have new chest pain or an irregular heartbeat. Any of these symptoms may represent a serious problem that is an emergency. Do not wait to see if the symptoms will   go away. Get medical help at once. Call your local emergency services (911 in [place] Do not drive yourself to the hospital.   This information is not intended to replace advice given to you by your health care provider. Make sure you discuss any questions you have with your health care provider.   Document Released: 12/08/2004 Document Revised: 11/21/2014 Document Reviewed:  05/03/2013 Elsevier Interactive Patient Education 2016 Elsevier Inc.  

## 2016-12-27 ENCOUNTER — Encounter: Payer: Self-pay | Admitting: Family

## 2017-01-04 ENCOUNTER — Ambulatory Visit: Payer: Managed Care, Other (non HMO) | Admitting: Family

## 2017-01-04 ENCOUNTER — Encounter (HOSPITAL_COMMUNITY): Payer: Managed Care, Other (non HMO)

## 2017-04-17 ENCOUNTER — Encounter: Payer: Self-pay | Admitting: Family

## 2017-04-19 ENCOUNTER — Ambulatory Visit: Payer: Managed Care, Other (non HMO) | Admitting: Family

## 2017-04-19 ENCOUNTER — Encounter (HOSPITAL_COMMUNITY): Payer: Managed Care, Other (non HMO)

## 2017-05-15 ENCOUNTER — Encounter: Payer: Self-pay | Admitting: Family

## 2017-05-26 ENCOUNTER — Encounter (HOSPITAL_COMMUNITY): Payer: Managed Care, Other (non HMO)

## 2017-05-26 ENCOUNTER — Ambulatory Visit: Payer: Self-pay | Admitting: Family

## 2018-01-25 ENCOUNTER — Other Ambulatory Visit: Payer: Self-pay

## 2018-01-25 DIAGNOSIS — I6529 Occlusion and stenosis of unspecified carotid artery: Secondary | ICD-10-CM

## 2018-02-20 ENCOUNTER — Ambulatory Visit (HOSPITAL_COMMUNITY)
Admission: RE | Admit: 2018-02-20 | Discharge: 2018-02-20 | Disposition: A | Discharge status: Home / Self Care | Payer: PRIVATE HEALTH INSURANCE | Source: Ambulatory Visit | Attending: Vascular Surgery | Admitting: Vascular Surgery

## 2018-02-20 DIAGNOSIS — Z87891 Personal history of nicotine dependence: Secondary | ICD-10-CM | POA: Diagnosis not present

## 2018-02-20 DIAGNOSIS — I6529 Occlusion and stenosis of unspecified carotid artery: Secondary | ICD-10-CM | POA: Diagnosis present

## 2018-02-20 DIAGNOSIS — E785 Hyperlipidemia, unspecified: Secondary | ICD-10-CM | POA: Insufficient documentation

## 2018-02-22 ENCOUNTER — Other Ambulatory Visit: Payer: Self-pay

## 2018-02-22 ENCOUNTER — Encounter: Payer: Self-pay | Admitting: Family

## 2018-02-22 ENCOUNTER — Ambulatory Visit (INDEPENDENT_AMBULATORY_CARE_PROVIDER_SITE_OTHER): Payer: No Typology Code available for payment source | Admitting: Family

## 2018-02-22 VITALS — BP 125/79 | HR 85 | Temp 98.2°F | Resp 16 | Ht 63.0 in | Wt 170.0 lb

## 2018-02-22 DIAGNOSIS — I773 Arterial fibromuscular dysplasia: Secondary | ICD-10-CM | POA: Diagnosis not present
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
## 2018-02-22 NOTE — Patient Instructions (Signed)

## 2018-07-30 DIAGNOSIS — F411 Generalized anxiety disorder: Secondary | ICD-10-CM | POA: Diagnosis not present

## 2018-07-30 DIAGNOSIS — E559 Vitamin D deficiency, unspecified: Secondary | ICD-10-CM | POA: Diagnosis not present

## 2018-07-30 DIAGNOSIS — R7301 Impaired fasting glucose: Secondary | ICD-10-CM | POA: Diagnosis not present

## 2018-07-30 DIAGNOSIS — E876 Hypokalemia: Secondary | ICD-10-CM | POA: Diagnosis not present

## 2018-07-30 DIAGNOSIS — E782 Mixed hyperlipidemia: Secondary | ICD-10-CM | POA: Diagnosis not present

## 2018-08-09 DIAGNOSIS — I471 Supraventricular tachycardia: Secondary | ICD-10-CM | POA: Diagnosis not present

## 2018-08-09 DIAGNOSIS — R7301 Impaired fasting glucose: Secondary | ICD-10-CM | POA: Diagnosis not present

## 2018-08-09 DIAGNOSIS — Z23 Encounter for immunization: Secondary | ICD-10-CM | POA: Diagnosis not present

## 2018-08-09 DIAGNOSIS — E782 Mixed hyperlipidemia: Secondary | ICD-10-CM | POA: Diagnosis not present

## 2018-08-09 DIAGNOSIS — I6523 Occlusion and stenosis of bilateral carotid arteries: Secondary | ICD-10-CM | POA: Diagnosis not present

## 2018-09-08 DIAGNOSIS — J069 Acute upper respiratory infection, unspecified: Secondary | ICD-10-CM | POA: Diagnosis not present

## 2018-09-08 DIAGNOSIS — J0100 Acute maxillary sinusitis, unspecified: Secondary | ICD-10-CM | POA: Diagnosis not present

## 2018-09-08 DIAGNOSIS — R05 Cough: Secondary | ICD-10-CM | POA: Diagnosis not present

## 2018-09-08 DIAGNOSIS — Z79899 Other long term (current) drug therapy: Secondary | ICD-10-CM | POA: Diagnosis not present

## 2018-09-08 DIAGNOSIS — E7800 Pure hypercholesterolemia, unspecified: Secondary | ICD-10-CM | POA: Diagnosis not present

## 2018-09-08 DIAGNOSIS — Z7982 Long term (current) use of aspirin: Secondary | ICD-10-CM | POA: Diagnosis not present

## 2018-09-19 DIAGNOSIS — Z6829 Body mass index (BMI) 29.0-29.9, adult: Secondary | ICD-10-CM | POA: Diagnosis not present

## 2018-09-19 DIAGNOSIS — J45909 Unspecified asthma, uncomplicated: Secondary | ICD-10-CM | POA: Diagnosis not present

## 2019-02-07 DIAGNOSIS — Z0001 Encounter for general adult medical examination with abnormal findings: Secondary | ICD-10-CM | POA: Diagnosis not present

## 2019-02-13 DIAGNOSIS — Z6830 Body mass index (BMI) 30.0-30.9, adult: Secondary | ICD-10-CM | POA: Diagnosis not present

## 2019-02-13 DIAGNOSIS — Z0001 Encounter for general adult medical examination with abnormal findings: Secondary | ICD-10-CM | POA: Diagnosis not present

## 2019-05-02 DIAGNOSIS — Z20828 Contact with and (suspected) exposure to other viral communicable diseases: Secondary | ICD-10-CM | POA: Diagnosis not present

## 2019-08-03 DIAGNOSIS — Z23 Encounter for immunization: Secondary | ICD-10-CM | POA: Diagnosis not present

## 2019-12-06 DIAGNOSIS — F411 Generalized anxiety disorder: Secondary | ICD-10-CM | POA: Diagnosis not present

## 2019-12-06 DIAGNOSIS — E782 Mixed hyperlipidemia: Secondary | ICD-10-CM | POA: Diagnosis not present

## 2019-12-06 DIAGNOSIS — Z1331 Encounter for screening for depression: Secondary | ICD-10-CM | POA: Diagnosis not present

## 2019-12-06 DIAGNOSIS — R7301 Impaired fasting glucose: Secondary | ICD-10-CM | POA: Diagnosis not present

## 2019-12-06 DIAGNOSIS — Z1389 Encounter for screening for other disorder: Secondary | ICD-10-CM | POA: Diagnosis not present

## 2019-12-06 DIAGNOSIS — E559 Vitamin D deficiency, unspecified: Secondary | ICD-10-CM | POA: Diagnosis not present

## 2019-12-06 DIAGNOSIS — Z6830 Body mass index (BMI) 30.0-30.9, adult: Secondary | ICD-10-CM | POA: Diagnosis not present

## 2019-12-06 DIAGNOSIS — I471 Supraventricular tachycardia: Secondary | ICD-10-CM | POA: Diagnosis not present

## 2020-04-12 ENCOUNTER — Inpatient Hospital Stay (HOSPITAL_COMMUNITY)
Admission: AD | Admit: 2020-04-12 | Discharge: 2020-04-21 | DRG: 025 | Disposition: A | Discharge status: Rehabilitation Facility | Payer: BC Managed Care – PPO | Source: Other Acute Inpatient Hospital | Attending: Internal Medicine | Admitting: Internal Medicine

## 2020-04-12 ENCOUNTER — Encounter: Payer: Self-pay | Admitting: Physician Assistant

## 2020-04-12 DIAGNOSIS — R569 Unspecified convulsions: Secondary | ICD-10-CM

## 2020-04-12 DIAGNOSIS — Z86018 Personal history of other benign neoplasm: Secondary | ICD-10-CM

## 2020-04-12 DIAGNOSIS — R29810 Facial weakness: Secondary | ICD-10-CM | POA: Diagnosis present

## 2020-04-12 DIAGNOSIS — G935 Compression of brain: Secondary | ICD-10-CM | POA: Diagnosis not present

## 2020-04-12 DIAGNOSIS — Z8673 Personal history of transient ischemic attack (TIA), and cerebral infarction without residual deficits: Secondary | ICD-10-CM | POA: Diagnosis not present

## 2020-04-12 DIAGNOSIS — D72829 Elevated white blood cell count, unspecified: Secondary | ICD-10-CM

## 2020-04-12 DIAGNOSIS — E785 Hyperlipidemia, unspecified: Secondary | ICD-10-CM

## 2020-04-12 DIAGNOSIS — Z20822 Contact with and (suspected) exposure to covid-19: Secondary | ICD-10-CM | POA: Diagnosis present

## 2020-04-12 DIAGNOSIS — I10 Essential (primary) hypertension: Secondary | ICD-10-CM

## 2020-04-12 DIAGNOSIS — D496 Neoplasm of unspecified behavior of brain: Secondary | ICD-10-CM | POA: Diagnosis present

## 2020-04-12 DIAGNOSIS — G8194 Hemiplegia, unspecified affecting left nondominant side: Secondary | ICD-10-CM | POA: Diagnosis not present

## 2020-04-12 DIAGNOSIS — Z87891 Personal history of nicotine dependence: Secondary | ICD-10-CM | POA: Diagnosis not present

## 2020-04-12 DIAGNOSIS — Z86011 Personal history of benign neoplasm of the brain: Secondary | ICD-10-CM | POA: Diagnosis not present

## 2020-04-12 DIAGNOSIS — Z7982 Long term (current) use of aspirin: Secondary | ICD-10-CM | POA: Diagnosis not present

## 2020-04-12 DIAGNOSIS — D320 Benign neoplasm of cerebral meninges: Principal | ICD-10-CM | POA: Diagnosis present

## 2020-04-12 DIAGNOSIS — Z79899 Other long term (current) drug therapy: Secondary | ICD-10-CM

## 2020-04-12 DIAGNOSIS — Z452 Encounter for adjustment and management of vascular access device: Secondary | ICD-10-CM

## 2020-04-12 DIAGNOSIS — D62 Acute posthemorrhagic anemia: Secondary | ICD-10-CM

## 2020-04-12 DIAGNOSIS — T380X5A Adverse effect of glucocorticoids and synthetic analogues, initial encounter: Secondary | ICD-10-CM | POA: Diagnosis not present

## 2020-04-12 DIAGNOSIS — G936 Cerebral edema: Secondary | ICD-10-CM | POA: Diagnosis present

## 2020-04-12 LAB — CBC WITH DIFFERENTIAL/PLATELET
Abs Immature Granulocytes: 0.04 10*3/uL (ref 0.00–0.07)
Basophils Absolute: 0 10*3/uL (ref 0.0–0.1)
Basophils Relative: 0 %
Eosinophils Absolute: 0 10*3/uL (ref 0.0–0.5)
Eosinophils Relative: 0 %
HCT: 42.8 % (ref 36.0–46.0)
Hemoglobin: 13.7 g/dL (ref 12.0–15.0)
Immature Granulocytes: 0 %
Lymphocytes Relative: 12 %
Lymphs Abs: 1.2 10*3/uL (ref 0.7–4.0)
MCH: 30 pg (ref 26.0–34.0)
MCHC: 32 g/dL (ref 30.0–36.0)
MCV: 93.7 fL (ref 80.0–100.0)
Monocytes Absolute: 0.1 10*3/uL (ref 0.1–1.0)
Monocytes Relative: 1 %
Neutro Abs: 8.6 10*3/uL — ABNORMAL HIGH (ref 1.7–7.7)
Neutrophils Relative %: 87 %
Platelets: 259 10*3/uL (ref 150–400)
RBC: 4.57 MIL/uL (ref 3.87–5.11)
RDW: 12.5 % (ref 11.5–15.5)
WBC: 9.9 10*3/uL (ref 4.0–10.5)
nRBC: 0 % (ref 0.0–0.2)

## 2020-04-12 LAB — COMPREHENSIVE METABOLIC PANEL
ALT: 19 U/L (ref 0–44)
AST: 18 U/L (ref 15–41)
Albumin: 4 g/dL (ref 3.5–5.0)
Alkaline Phosphatase: 82 U/L (ref 38–126)
Anion gap: 12 (ref 5–15)
BUN: 10 mg/dL (ref 8–23)
CO2: 22 mmol/L (ref 22–32)
Calcium: 9.6 mg/dL (ref 8.9–10.3)
Chloride: 107 mmol/L (ref 98–111)
Creatinine, Ser: 0.72 mg/dL (ref 0.44–1.00)
GFR calc Af Amer: >60 mL/min (ref >60)
GFR calc non Af Amer: >60 mL/min (ref >60)
Glucose, Bld: 139 mg/dL — ABNORMAL HIGH (ref 70–99)
Potassium: 4 mmol/L (ref 3.5–5.1)
Sodium: 141 mmol/L (ref 135–145)
Total Bilirubin: 0.7 mg/dL (ref 0.3–1.2)
Total Protein: 7.3 g/dL (ref 6.5–8.1)

## 2020-04-12 LAB — PROTIME-INR
INR: 1.1 (ref 0.8–1.2)
Prothrombin Time: 13.5 seconds (ref 11.4–15.2)

## 2020-04-12 LAB — APTT: aPTT: 25 seconds (ref 24–36)

## 2020-04-12 LAB — HIV ANTIBODY (ROUTINE TESTING W REFLEX): HIV Screen 4th Generation wRfx: NONREACTIVE

## 2020-04-12 MED ORDER — ACETAMINOPHEN 325 MG PO TABS
650.0000 mg | ORAL_TABLET | Freq: Four times a day (QID) | ORAL | Status: DC | PRN
  Administered 2020-04-14: 650 mg via ORAL
  Filled 2020-04-12 (×4): qty 2

## 2020-04-12 MED ORDER — ATORVASTATIN CALCIUM 40 MG PO TABS: 40.0000 mg | ORAL_TABLET | Freq: Every day | ORAL | Status: DC

## 2020-04-12 MED ORDER — ATORVASTATIN CALCIUM 40 MG PO TABS
40.0000 mg | ORAL_TABLET | Freq: Every day | ORAL | Status: DC
  Administered 2020-04-12 – 2020-04-21 (×9): 40 mg via ORAL
  Filled 2020-04-12 (×10): qty 1

## 2020-04-12 MED ORDER — POTASSIUM CHLORIDE CRYS ER 20 MEQ PO TBCR
20.0000 meq | EXTENDED_RELEASE_TABLET | ORAL | Status: DC
  Filled 2020-04-12: qty 1

## 2020-04-12 MED ORDER — ASPIRIN EC 81 MG PO TBEC: 81.0000 mg | DELAYED_RELEASE_TABLET | Freq: Every day | ORAL | Status: DC

## 2020-04-12 MED ORDER — LEVETIRACETAM IN NACL 500 MG/100ML IV SOLN
500.0000 mg | Freq: Two times a day (BID) | INTRAVENOUS | Status: DC
  Administered 2020-04-12 – 2020-04-13 (×2): 500 mg via INTRAVENOUS
  Filled 2020-04-12 (×2): qty 100

## 2020-04-12 MED ORDER — DEXAMETHASONE SOD PHOSPHATE PF 10 MG/ML IJ SOLN
10.00 | INTRAMUSCULAR | Status: DC
Start: 2020-04-12 — End: 2020-04-12

## 2020-04-12 MED ORDER — VITAMIN D 25 MCG (1000 UNIT) PO TABS
1000.0000 [IU] | ORAL_TABLET | Freq: Every day | ORAL | Status: DC
  Administered 2020-04-13 – 2020-04-21 (×8): 1000 [IU] via ORAL
  Filled 2020-04-12 (×9): qty 1

## 2020-04-12 MED ORDER — METOPROLOL SUCCINATE ER 25 MG PO TB24
12.5000 mg | ORAL_TABLET | Freq: Every day | ORAL | Status: DC
  Administered 2020-04-13 – 2020-04-21 (×8): 12.5 mg via ORAL
  Filled 2020-04-12 (×11): qty 1

## 2020-04-12 MED ORDER — METOPROLOL SUCCINATE ER 25 MG PO TB24: 12.5000 mg | ORAL_TABLET | Freq: Every day | ORAL | Status: DC

## 2020-04-12 MED ORDER — DEXAMETHASONE SODIUM PHOSPHATE 4 MG/ML IJ SOLN
4.0000 mg | Freq: Four times a day (QID) | INTRAMUSCULAR | Status: DC
  Administered 2020-04-12 – 2020-04-18 (×22): 4 mg via INTRAVENOUS
  Filled 2020-04-12 (×22): qty 1

## 2020-04-12 NOTE — H&P (Signed)
Reason for Consult:seizure and meningioma Referring Physician:Gherghe  [name] [name] is an 64 y.o. female who had a seizure that was witnessed by her husband on the night of 04/11/2020. She reported that she had spent the day at [name] and walked 9 miles prior to her seizure. She has a history of right frontal meningioma for which Dr. Kathyrn [name] did a craniotomy in 2014 for tumor resection. She reports taking dilantin for 3 months postop and no antiepileptics since then. She further reports no seizure activity prior to the event on 04/11/20. She does admit to slight left-sided weakness that has been unchanged since her surgery in 2014. She was lost to follow up after 2016.  Her or her husband were unable to tell the duration of the seizure. She had no LOC and was alert throughout the event. She describes the event as mostly affecting her eyes and the inability to blink with some facial numbness. Her symptoms fully resolved after the event and reports she is back to her baseline.  Past Medical History:  Diagnosis Date  . Bruises easily   . Carotid artery occlusion   . Dizziness   . Headache(784.0)   . Hyperlipidemia    takes Atorvastatin daily  . PONV (postoperative nausea and vomiting)   . Stroke ([place])   . TIA (transient ischemic attack)     Past Surgical History:  Procedure Laterality Date  . ABDOMINAL HYSTERECTOMY  2002  . COLONOSCOPY    . CRANIOTOMY Right 09/10/2013   Procedure: Craniotomy for tumor excision;  Surgeon: [name] Lose, MD;  Location: [facility];  Service: Neurosurgery;  Laterality: Right;  Craniotomy for meningioma with stealth  . DILATION AND CURETTAGE OF UTERUS  1980  . left hand surgery  2007 and 2008   plates and screws    Family History  Problem Relation Age of Onset  . Cancer Father        Prostate  . Deep vein thrombosis Father     Social History:  reports that she has quit smoking. She has never used smokeless tobacco. She reports that she does  not drink alcohol or use drugs.  Allergies: No Known Allergies  Medications: I have reviewed the patient's current medications.  Results for orders placed or performed during the hospital encounter of 04/12/20 (from the past 48 hour(s))  Comprehensive metabolic panel     Status: Abnormal   Collection Time: 04/12/20  5:16 PM  Result Value Ref Range   Sodium 141 135 - 145 mmol/L   Potassium 4.0 3.5 - 5.1 mmol/L   Chloride 107 98 - 111 mmol/L   CO2 22 22 - 32 mmol/L   Glucose, Bld 139 (H) 70 - 99 mg/dL    Comment: Glucose reference range applies only to samples taken after fasting for at least 8 hours.   BUN 10 8 - 23 mg/dL   Creatinine, Ser 0.72 0.44 - 1.00 mg/dL   Calcium 9.6 8.9 - 10.3 mg/dL   Total Protein 7.3 6.5 - 8.1 g/dL   Albumin 4.0 3.5 - 5.0 g/dL   AST 18 15 - 41 U/L   ALT 19 0 - 44 U/L   Alkaline Phosphatase 82 38 - 126 U/L   Total Bilirubin 0.7 0.3 - 1.2 mg/dL   GFR calc non Af Amer >60 >60 mL/min   GFR calc Af Amer >60 >60 mL/min   Anion gap 12 5 - 15    Comment: Performed at [facility], [place] [address]  [place], [place] [address]  CBC WITH DIFFERENTIAL     Status: Abnormal   Collection Time: 04/12/20  5:16 PM  Result Value Ref Range   WBC 9.9 4.0 - 10.5 K/uL   RBC 4.57 3.87 - 5.11 MIL/uL   Hemoglobin 13.7 12.0 - 15.0 g/dL   HCT 42.8 36.0 - 46.0 %   MCV 93.7 80.0 - 100.0 fL   MCH 30.0 26.0 - 34.0 pg   MCHC 32.0 30.0 - 36.0 g/dL   RDW 12.5 11.5 - 15.5 %   Platelets 259 150 - 400 K/uL   nRBC 0.0 0.0 - 0.2 %   Neutrophils Relative % 87 %   Neutro Abs 8.6 (H) 1.7 - 7.7 K/uL   Lymphocytes Relative 12 %   Lymphs Abs 1.2 0.7 - 4.0 K/uL   Monocytes Relative 1 %   Monocytes Absolute 0.1 0.1 - 1.0 K/uL   Eosinophils Relative 0 %   Eosinophils Absolute 0.0 0.0 - 0.5 K/uL   Basophils Relative 0 %   Basophils Absolute 0.0 0.0 - 0.1 K/uL   Immature Granulocytes 0 %   Abs Immature Granulocytes 0.04 0.00 - 0.07 K/uL    Comment: Performed at [facility] [address], [place], [place] [address]  Protime-INR     Status: None   Collection Time: 04/12/20  5:16 PM  Result Value Ref Range   Prothrombin Time 13.5 11.4 - 15.2 seconds   INR 1.1 0.8 - 1.2    Comment: (NOTE) INR goal varies based on device and disease states. Performed at [facility], [place] [address] [place] [address], [place], [place] [address]   APTT     Status: None   Collection Time: 04/12/20  5:16 PM  Result Value Ref Range   aPTT 25 24 - 36 seconds    Comment: Performed at [facility] [address], [place], [place] [address]    No results found.  Review of Systems - Negative except as per HPI    Blood pressure 136/71, pulse 89, temperature 98.2 F (36.8 C), temperature source Oral, resp. rate 18, SpO2 98 %. Physical Exam  Constitutional: She is oriented to person, place, and time. She appears well-developed and well-nourished.  Eyes: Pupils are equal, round, and reactive to light. Conjunctivae and EOM are normal.  Healed right frontal craniotomy incision  Musculoskeletal:        General: Normal range of motion.     Cervical back: Normal range of motion and neck supple.  Neurological: She is alert and oriented to person, place, and time. She has normal reflexes. No cranial nerve deficit or sensory deficit. GCS eye subscore is 4. GCS verbal subscore is 5. GCS motor subscore is 6.  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  Admit, start keppra, decadron, MRI, observe in hospital, plan OR for tumor resection by Dr. Kathyrn [name] in near future.  This discussed with patient and her husband.  [name] Shoals, MD 04/12/2020, 6:27 PM

## 2020-04-12 NOTE — Progress Notes (Signed)
Patient arrived to unit, Alert/Oriented x4. Appears stable, not in distress. Bed left in lowest position, call bell within reach. Spouse at bedside. Awaiting new orders. Will continue to monitor. Gwendolyn Grant, RN

## 2020-04-12 NOTE — H&P (Signed)
History and Physical    [name] [name] [id number] DOB: 10/13/1956 DOA: 04/12/2020  I have briefly reviewed the patient's prior medical records in [place]  PCP: Pleas Koch Virgina Evener, MD  Patient coming from: home via [facility] ER  Chief Complaint: Seizure activity  HPI: [name] [name] is a 64 y.o. female with medical history significant of hyperlipidemia, hypertension, who comes to the hospital with chief complaint of a seizure episode while at home.  She has a history of meningioma for which she was operated in 2014 by Dr. Kathyrn Sheriff.  She was in her normal state of health at home up until last night, when all of a sudden husband tells me that she started having seizure-like activity, confusion, and he decided to take her to the emergency room.  Patient has some intermittent recollection during the episode as felt like she could not move the left side of her body and could not get the words out.  Currently she is feeling well, denies any chest pain, denies any shortness of breath.  She denies any weakness, numbness or tingling in her face or arms.  She has had no recent fever, chills, abdominal pain, nausea vomiting or diarrhea.  She denies any sick contacts, she works as a Marine scientist and has received a Covid vaccination.  ED Course: At [facility] ED, patient received Ativan and underwent a CT head which showed a hyperdense, multilobular extra-axial mass at the anterior aspect of the falx celebratory in the right frontal convexity, measuring up to 5.2 cm, most consistent with recurrent meningioma.  It also showed vasogenic edema in the right frontal white matter without midline shift or acute hemorrhage.  Patient received 10 mg of Decadron, Keppra 1000 mg and neurosurgery was consulted here and she was transferred for their evaluation.  Review of Systems: All systems reviewed, and apart from HPI, all negative  Past Medical History:  Diagnosis Date  . Bruises easily   . Carotid artery occlusion    . Dizziness   . Headache(784.0)   . Hyperlipidemia    takes Atorvastatin daily  . PONV (postoperative nausea and vomiting)   . Stroke ([place])   . TIA (transient ischemic attack)     Past Surgical History:  Procedure Laterality Date  . ABDOMINAL HYSTERECTOMY  2002  . COLONOSCOPY    . CRANIOTOMY Right 09/10/2013   Procedure: Craniotomy for tumor excision;  Surgeon: Consuella Lose, MD;  Location: [place] [facility];  Service: Neurosurgery;  Laterality: Right;  Craniotomy for meningioma with stealth  . DILATION AND CURETTAGE OF UTERUS  1980  . left hand surgery  2007 and 2008   plates and screws     reports that she has quit smoking. She has never used smokeless tobacco. She reports that she does not drink alcohol or use drugs.  No Known Allergies  Family History  Problem Relation Age of Onset  . Cancer Father        Prostate  . Deep vein thrombosis Father     Prior to Admission medications   Medication Sig Start Date End Date Taking? Authorizing Provider  acetaminophen (TYLENOL) 325 MG tablet Take 650 mg by mouth every 6 (six) hours as needed for moderate pain or headache.    [provider]  aspirin 81 MG tablet Take 81 mg by mouth daily.    [provider]  atorvastatin (LIPITOR) 40 MG tablet Take 40 mg by mouth daily.  06/27/12   [provider]  Cholecalciferol (VITAMIN D3) 2000  UNITS TABS Take 1 tablet by mouth daily.    [provider]  ibuprofen (ADVIL,MOTRIN) 200 MG tablet Take 200-400 mg by mouth every 6 (six) hours as needed for headache or moderate pain.    [provider]  metoprolol succinate (TOPROL-XL) 25 MG 24 hr tablet Take 12.5 mg by mouth daily.    [provider]  potassium chloride SA (K-DUR,KLOR-CON) 20 MEQ tablet Take 20 mEq by mouth every other day.    [provider]    Physical Exam: Vitals:   04/12/20 1613  BP: 136/71  Pulse: 89  Resp: 18  Temp: 98.2 F (36.8 C)  TempSrc: Oral  SpO2:  98%    Constitutional: NAD, calm, comfortable Eyes: PERRL, lids and conjunctivae normal ENMT: Mucous membranes are moist. Posterior pharynx clear of any exudate or lesions.Normal dentition.  Neck: normal, supple Respiratory: clear to auscultation bilaterally, no wheezing, no crackles. Normal respiratory effort. No accessory muscle use.  Cardiovascular: Regular rate and rhythm, no murmurs / rubs / gallops. No extremity edema.  Abdomen: no tenderness, no masses palpated. Bowel sounds positive.  Musculoskeletal: no clubbing / cyanosis. Normal muscle tone.  Skin: no rashes, lesions, ulcers. No induration Neurologic: CN 2-12 grossly intact. Strength 5/5 in all 4.  Psychiatric: Normal judgment and insight. Alert and oriented x 3. Normal mood.   Labs on Admission: I have personally reviewed following labs and imaging studies  CBC: No results for input(s): WBC, NEUTROABS, HGB, HCT, MCV, PLT in the last 168 hours. Basic Metabolic Panel: No results for input(s): NA, K, CL, CO2, GLUCOSE, BUN, CREATININE, CALCIUM, MG, PHOS in the last 168 hours. Liver Function Tests: No results for input(s): AST, ALT, ALKPHOS, BILITOT, PROT, ALBUMIN in the last 168 hours. Coagulation Profile: No results for input(s): INR, PROTIME in the last 168 hours. BNP (last 3 results) No results for input(s): PROBNP in the last 8760 hours. CBG: No results for input(s): GLUCAP in the last 168 hours. Thyroid Function Tests: No results for input(s): TSH, T4TOTAL, FREET4, T3FREE, THYROIDAB in the last 72 hours. Urine analysis: No results found for: COLORURINE, APPEARANCEUR, LABSPEC, PHURINE, GLUCOSEU, HGBUR, BILIRUBINUR, KETONESUR, PROTEINUR, UROBILINOGEN, NITRITE, LEUKOCYTESUR   Radiological Exams on Admission: No results found.   Assessment/Plan  Principal Problem Seizure activity-likely due to recurrent meningioma, patient was started on Keppra in the ED, I will continue  Active Problems Recurrent  meningioma-neurosurgery consulted, I have discussed with Dr. Vertell Limber and he will come evaluate the patient.  Continue Decadron for vasogenic edema  Hyperlipidemia-continue statin  Hypertension-continue metoprolol.  DVT prophylaxis: SCDs  Code Status: Full code  Family Communication: 3 family members at bedside  Disposition Plan: home when ready Bed Type: Telemetry with seizure precautions Consults called: neurosurgery  Obs/Inp: Observation   Marzetta Board, MD, PhD [facility]  Contact via [email or web address]  04/12/2020, 5:18 PM

## 2020-04-13 ENCOUNTER — Other Ambulatory Visit: Payer: Self-pay

## 2020-04-13 ENCOUNTER — Encounter (HOSPITAL_COMMUNITY): Payer: Self-pay | Admitting: Internal Medicine

## 2020-04-13 ENCOUNTER — Observation Stay (HOSPITAL_COMMUNITY): Payer: BC Managed Care – PPO

## 2020-04-13 DIAGNOSIS — D320 Benign neoplasm of cerebral meninges: Secondary | ICD-10-CM | POA: Diagnosis present

## 2020-04-13 DIAGNOSIS — D72828 Other elevated white blood cell count: Secondary | ICD-10-CM | POA: Diagnosis not present

## 2020-04-13 DIAGNOSIS — Z8673 Personal history of transient ischemic attack (TIA), and cerebral infarction without residual deficits: Secondary | ICD-10-CM | POA: Diagnosis not present

## 2020-04-13 DIAGNOSIS — D329 Benign neoplasm of meninges, unspecified: Secondary | ICD-10-CM | POA: Diagnosis not present

## 2020-04-13 DIAGNOSIS — Z20822 Contact with and (suspected) exposure to covid-19: Secondary | ICD-10-CM | POA: Diagnosis present

## 2020-04-13 DIAGNOSIS — D72829 Elevated white blood cell count, unspecified: Secondary | ICD-10-CM | POA: Diagnosis not present

## 2020-04-13 DIAGNOSIS — D496 Neoplasm of unspecified behavior of brain: Secondary | ICD-10-CM | POA: Diagnosis present

## 2020-04-13 DIAGNOSIS — Z86011 Personal history of benign neoplasm of the brain: Secondary | ICD-10-CM | POA: Diagnosis not present

## 2020-04-13 DIAGNOSIS — I10 Essential (primary) hypertension: Secondary | ICD-10-CM | POA: Diagnosis not present

## 2020-04-13 DIAGNOSIS — R569 Unspecified convulsions: Secondary | ICD-10-CM | POA: Diagnosis not present

## 2020-04-13 DIAGNOSIS — G936 Cerebral edema: Secondary | ICD-10-CM | POA: Diagnosis present

## 2020-04-13 DIAGNOSIS — G8194 Hemiplegia, unspecified affecting left nondominant side: Secondary | ICD-10-CM | POA: Diagnosis present

## 2020-04-13 DIAGNOSIS — Z87891 Personal history of nicotine dependence: Secondary | ICD-10-CM | POA: Diagnosis not present

## 2020-04-13 DIAGNOSIS — Z7982 Long term (current) use of aspirin: Secondary | ICD-10-CM | POA: Diagnosis not present

## 2020-04-13 DIAGNOSIS — D62 Acute posthemorrhagic anemia: Secondary | ICD-10-CM | POA: Diagnosis not present

## 2020-04-13 DIAGNOSIS — G935 Compression of brain: Secondary | ICD-10-CM | POA: Diagnosis present

## 2020-04-13 DIAGNOSIS — R29810 Facial weakness: Secondary | ICD-10-CM | POA: Diagnosis present

## 2020-04-13 DIAGNOSIS — Z79899 Other long term (current) drug therapy: Secondary | ICD-10-CM | POA: Diagnosis not present

## 2020-04-13 DIAGNOSIS — D72825 Bandemia: Secondary | ICD-10-CM | POA: Diagnosis not present

## 2020-04-13 DIAGNOSIS — Z86018 Personal history of other benign neoplasm: Secondary | ICD-10-CM | POA: Diagnosis not present

## 2020-04-13 DIAGNOSIS — E785 Hyperlipidemia, unspecified: Secondary | ICD-10-CM | POA: Diagnosis not present

## 2020-04-13 DIAGNOSIS — T380X5A Adverse effect of glucocorticoids and synthetic analogues, initial encounter: Secondary | ICD-10-CM | POA: Diagnosis not present

## 2020-04-13 LAB — CBC
HCT: 39.6 % (ref 36.0–46.0)
Hemoglobin: 12.6 g/dL (ref 12.0–15.0)
MCH: 29.9 pg (ref 26.0–34.0)
MCHC: 31.8 g/dL (ref 30.0–36.0)
MCV: 93.8 fL (ref 80.0–100.0)
Platelets: 245 10*3/uL (ref 150–400)
RBC: 4.22 MIL/uL (ref 3.87–5.11)
RDW: 12.9 % (ref 11.5–15.5)
WBC: 15.4 10*3/uL — ABNORMAL HIGH (ref 4.0–10.5)
nRBC: 0 % (ref 0.0–0.2)

## 2020-04-13 LAB — COMPREHENSIVE METABOLIC PANEL
ALT: 15 U/L (ref 0–44)
AST: 14 U/L — ABNORMAL LOW (ref 15–41)
Albumin: 3.4 g/dL — ABNORMAL LOW (ref 3.5–5.0)
Alkaline Phosphatase: 70 U/L (ref 38–126)
Anion gap: 9 (ref 5–15)
BUN: 12 mg/dL (ref 8–23)
CO2: 24 mmol/L (ref 22–32)
Calcium: 9.2 mg/dL (ref 8.9–10.3)
Chloride: 109 mmol/L (ref 98–111)
Creatinine, Ser: 0.73 mg/dL (ref 0.44–1.00)
GFR calc Af Amer: >60 mL/min (ref >60)
GFR calc non Af Amer: >60 mL/min (ref >60)
Glucose, Bld: 144 mg/dL — ABNORMAL HIGH (ref 70–99)
Potassium: 3.9 mmol/L (ref 3.5–5.1)
Sodium: 142 mmol/L (ref 135–145)
Total Bilirubin: 0.5 mg/dL (ref 0.3–1.2)
Total Protein: 6.4 g/dL — ABNORMAL LOW (ref 6.5–8.1)

## 2020-04-13 MED ORDER — GENERIC EXTERNAL MEDICATION
500.00 | Status: DC
Start: 2020-04-12 — End: 2020-04-13

## 2020-04-13 MED ORDER — METOPROLOL SUCCINATE ER 25 MG PO TB24
12.50 | ORAL_TABLET | ORAL | Status: DC
Start: 2020-04-13 — End: 2020-04-13

## 2020-04-13 MED ORDER — POTASSIUM CHLORIDE CRYS ER 20 MEQ PO TBCR
20.0000 meq | EXTENDED_RELEASE_TABLET | ORAL | Status: DC
  Administered 2020-04-14 – 2020-04-20 (×3): 20 meq via ORAL
  Filled 2020-04-13 (×4): qty 1

## 2020-04-13 MED ORDER — ASPIRIN 81 MG PO CHEW
81.00 | CHEWABLE_TABLET | ORAL | Status: DC
Start: 2020-04-13 — End: 2020-04-13

## 2020-04-13 MED ORDER — LEVETIRACETAM 500 MG PO TABS
500.0000 mg | ORAL_TABLET | Freq: Two times a day (BID) | ORAL | Status: DC
  Administered 2020-04-13 – 2020-04-16 (×7): 500 mg via ORAL
  Filled 2020-04-13 (×7): qty 1

## 2020-04-13 MED ORDER — POTASSIUM CHLORIDE CRYS ER 20 MEQ PO TBCR
20.00 | EXTENDED_RELEASE_TABLET | ORAL | Status: DC
Start: 2020-04-13 — End: 2020-04-13

## 2020-04-13 MED ORDER — GADOBUTROL 1 MMOL/ML IV SOLN
7.0000 mL | Freq: Once | INTRAVENOUS | Status: AC | PRN
  Administered 2020-04-13: 7 mL via INTRAVENOUS

## 2020-04-13 NOTE — Progress Notes (Signed)
PROGRESS NOTE    [name] [name]  [id number] DOB: [birth year] DOA: 04/12/2020 PCP: Curlene Labrum, MD    Brief Narrative:  64 year old female with history of meningioma that was surgically removed in 2014, left hemiparesis, hypertension and hyperlipidemia presented from home with episode of seizure and confusion.  Transferred from [place] with CT scan of the head showing 5.2 cm mass on the right frontal lobe.   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  Fall precautions. Started on [place] for seizure prophylaxis.  Change to p.o.  Dexamethasone for mass-effect. Seen by neurosurgery.  Plan for surgical resection on 6/2.  Hypertension: Stable.  Resume home medications.  Hyperlipidemia: Stable.  On a statin.   DVT prophylaxis: SCD Code Status: Full code Family Communication: None Disposition Plan: Status is: Observation  The patient will require care spanning > 2 midnights and should be moved to inpatient because: Ongoing diagnostic testing needed not appropriate for outpatient work up and Inpatient level of care appropriate due to severity of illness  Patient with active seizure due to space-occupying lesion on her brain that needs surgical resection, postop surgical care in intensive care unit.  Anticipate hospitalization more than 2 midnights.  Dispo: The patient is from: Home              Anticipated d/c is to: Home              Anticipated d/c date is: > 3 days              Patient currently is not medically stable to d/c.         Consultants:   Neurosurgery  Procedures:   None  Antimicrobials:   None   Subjective: Patient seen and examined.  No overnight events.  Did not have any recurrence of seizure.  Today she feels well.  She does have slight weakness of the left side of the body but no limitation of activities.  Objective: Vitals:   04/12/20 2301 04/13/20  0000 04/13/20 0309 04/13/20 0820  BP: 126/76  106/69 134/77  Pulse: 91  79 86  Resp: 20  20 16   Temp: 97.8 F (36.6 C)  98.5 F (36.9 C) 98.1 F (36.7 C)  TempSrc: Oral  Oral Oral  SpO2: 96%  97% 98%  Weight:  78.7 kg    Height:  5\' 4"  (1.626 m)      Intake/Output Summary (Last 24 hours) at 04/13/2020 1033 Last data filed at 04/13/2020 0017 Gross per 24 hour  Intake 340 ml  Output --  Net 340 ml   Filed Weights   04/13/20 0000  Weight: 78.7 kg    Examination:  General exam: Appears calm and comfortable  Respiratory system: Clear to auscultation. Respiratory effort normal. Cardiovascular system: S1 & S2 heard, RRR. No JVD, murmurs, rubs, gallops or clicks. No pedal edema. Gastrointestinal system: Abdomen is nondistended, soft and nontender. No organomegaly or masses felt. Normal bowel sounds heard. Central nervous system: Alert and oriented. No focal neurological deficits. Extremities: Motor power is normal. Skin: No rashes, lesions or ulcers Psychiatry: Judgement and insight appear normal. Mood & affect appropriate.     Data Reviewed: I have personally reviewed following labs and imaging studies  CBC: Recent Labs  Lab 04/12/20 1716 04/13/20 0437  WBC 9.9 15.4*  NEUTROABS 8.6*  --   HGB 13.7 12.6  HCT 42.8 39.6  MCV 93.7 93.8  PLT 259 [id number]   Basic Metabolic Panel: Recent Labs  Lab 04/12/20 1716 04/13/20 0437  NA 141 142  K 4.0 3.9  CL 107 109  CO2 22 24  GLUCOSE 139* 144*  BUN 10 12  CREATININE 0.72 0.73  CALCIUM 9.6 9.2   GFR: Estimated Creatinine Clearance: 75 mL/min (by C-G formula based on SCr of 0.73 mg/dL). Liver Function Tests: Recent Labs  Lab 04/12/20 1716 04/13/20 0437  AST 18 14*  ALT 19 15  ALKPHOS 82 70  BILITOT 0.7 0.5  PROT 7.3 6.4*  ALBUMIN 4.0 3.4*   No results for input(s): LIPASE, AMYLASE in the last 168 hours. No results for input(s): AMMONIA in the last 168 hours. Coagulation Profile: Recent Labs  Lab 04/12/20 1716   INR 1.1   Cardiac Enzymes: No results for input(s): CKTOTAL, CKMB, CKMBINDEX, TROPONINI in the last 168 hours. BNP (last 3 results) No results for input(s): PROBNP in the last 8760 hours. HbA1C: No results for input(s): HGBA1C in the last 72 hours. CBG: No results for input(s): GLUCAP in the last 168 hours. Lipid Profile: No results for input(s): CHOL, HDL, LDLCALC, TRIG, CHOLHDL, LDLDIRECT in the last 72 hours. Thyroid Function Tests: No results for input(s): TSH, T4TOTAL, FREET4, T3FREE, THYROIDAB in the last 72 hours. Anemia Panel: No results for input(s): VITAMINB12, FOLATE, FERRITIN, TIBC, IRON, RETICCTPCT in the last 72 hours. Sepsis Labs: No results for input(s): PROCALCITON, LATICACIDVEN in the last 168 hours.  No results found for this or any previous visit (from the past 240 hour(s)).       Radiology Studies: MR BRAIN W WO CONTRAST  Result Date: 04/13/2020 CLINICAL DATA:  Recurrent meningioma EXAM: MRI HEAD WITHOUT AND WITH CONTRAST TECHNIQUE: Multiplanar, multiecho pulse sequences of the brain and surrounding structures were obtained without and with intravenous contrast. CONTRAST:  51mL GADAVIST GADOBUTROL 1 MMOL/ML IV SOLN COMPARISON:  2016 FINDINGS: Brain: There are two areas of recurrent meningioma. The first along the right frontoparietal convexity measuring approximately 5.7 x 3.2 x 3.4 cm. Adjacent but probably discontiguous second area along the right aspect of the falx with some contralateral extension measures approximately 5.1 x 3.2 x 2.2 cm. There is compression and possible invasion of the adjacent superior sagittal sinus, which remains patent. Mild edema in the underlying frontoparietal white matter. Mild regional mass effect is present. There is no acute infarction or intracranial hemorrhage. Encephalomalacia in the posterior right frontal lobe at the vertex related to prior resection. There is no hydrocephalus or extra-axial fluid collection. Vascular: Major  vessel flow voids at the skull base are preserved. Skull and upper cervical spine: Prior right craniotomy. Normal marrow signal is preserved. Sinuses/Orbits: Paranasal sinuses are aerated. Orbits are unremarkable. Other: Sella is unremarkable.  Mastoid air cells are clear. IMPRESSION: Recurrent meningioma along the right frontoparietal convexity and falx with some contralateral extension. Compression and possible invasion of the adjacent superior sagittal sinus, which remains patent. Mild underlying parenchymal edema and mass effect. Electronically Signed   By: Macy Mis M.D.   On: 04/13/2020 07:24        Scheduled Meds: . atorvastatin  40 mg Oral Daily  . cholecalciferol  1,000 Units Oral Daily  . dexamethasone (DECADRON) injection  4 mg Intravenous Q6H  . levETIRAcetam  500 mg Oral BID  . metoprolol succinate  12.5 mg Oral Daily  . [START ON 04/14/2020] potassium chloride SA  20 mEq Oral QODAY   Continuous Infusions:    LOS: 1 day  Time spent: 25 minutes    Barb Merino, MD [facility] Pager [phone]

## 2020-04-13 NOTE — Plan of Care (Signed)
Discussed with patient plan of care for the evening, pain management and admission questions with some teach back displayed 

## 2020-04-13 NOTE — Progress Notes (Signed)
Subjective: Patient reports doing OK.  "Am I going to die?"  Objective: Vital signs in last 24 hours: Temp:  [97.8 F (36.6 C)-98.9 F (37.2 C)] 98.1 F (36.7 C) (05/31 0820) Pulse Rate:  [79-91] 86 (05/31 0820) Resp:  [16-20] 16 (05/31 0820) BP: (106-144)/(69-83) 134/77 (05/31 0820) SpO2:  [96 %-98 %] 98 % (05/31 0820) Weight:  [78.7 kg] 78.7 kg (05/31 0000)  Intake/Output from previous day: 05/30 0701 - 05/31 0700 In: 340 [P.O.:240; IV Piggyback:100] Out: -  Intake/Output this shift: Total I/O In: -  Out: 350 [Urine:350]  Physical Exam: Awake, alert, conversant.  MAEW.  No new seizures.  Lab Results: Recent Labs    04/12/20 1716 04/13/20 0437  WBC 9.9 15.4*  HGB 13.7 12.6  HCT 42.8 39.6  PLT 259 245   BMET Recent Labs    04/12/20 1716 04/13/20 0437  NA 141 142  K 4.0 3.9  CL 107 109  CO2 22 24  GLUCOSE 139* 144*  BUN 10 12  CREATININE 0.72 0.73  CALCIUM 9.6 9.2    Studies/Results: MR BRAIN W WO CONTRAST  Result Date: 04/13/2020 CLINICAL DATA:  Recurrent meningioma EXAM: MRI HEAD WITHOUT AND WITH CONTRAST TECHNIQUE: Multiplanar, multiecho pulse sequences of the brain and surrounding structures were obtained without and with intravenous contrast. CONTRAST:  86mL GADAVIST GADOBUTROL 1 MMOL/ML IV SOLN COMPARISON:  2016 FINDINGS: Brain: There are two areas of recurrent meningioma. The first along the right frontoparietal convexity measuring approximately 5.7 x 3.2 x 3.4 cm. Adjacent but probably discontiguous second area along the right aspect of the falx with some contralateral extension measures approximately 5.1 x 3.2 x 2.2 cm. There is compression and possible invasion of the adjacent superior sagittal sinus, which remains patent. Mild edema in the underlying frontoparietal white matter. Mild regional mass effect is present. There is no acute infarction or intracranial hemorrhage. Encephalomalacia in the posterior right frontal lobe at the vertex related to  prior resection. There is no hydrocephalus or extra-axial fluid collection. Vascular: Major vessel flow voids at the skull base are preserved. Skull and upper cervical spine: Prior right craniotomy. Normal marrow signal is preserved. Sinuses/Orbits: Paranasal sinuses are aerated. Orbits are unremarkable. Other: Sella is unremarkable.  Mastoid air cells are clear. IMPRESSION: Recurrent meningioma along the right frontoparietal convexity and falx with some contralateral extension. Compression and possible invasion of the adjacent superior sagittal sinus, which remains patent. Mild underlying parenchymal edema and mass effect. Electronically Signed   By: Macy Mis M.D.   On: 04/13/2020 07:24    Assessment/Plan: MRI shows two areas of tumor with one surrounding superior sagittal sinus and extending across midline.  She will need surgery with likely subtotal resection and probably postop RT, given rapid regrowth, along with multi-centric tumors,  and likelihood of subtotal resection.  I have discussed this with the patient, who wishes to proceed with surgery.    LOS: 1 day    Peggyann Shoals, MD 04/13/2020, 11:54 AM

## 2020-04-13 NOTE — Plan of Care (Signed)
Discussed with patient plan of care for the evening, pain management and discussed upcoming procedure with some teach back displayed.

## 2020-04-14 ENCOUNTER — Other Ambulatory Visit: Payer: Self-pay | Admitting: Neurosurgery

## 2020-04-14 NOTE — Progress Notes (Signed)
PROGRESS NOTE    [name] [name]  [id number] DOB: [birth year] DOA: 04/12/2020 PCP: Curlene Labrum, MD    Brief Narrative:  64 year old female with history of meningioma that was surgically removed in 2014, left hemiparesis, hypertension and hyperlipidemia presented from home with episode of seizure and confusion.  Transferred from [place] with CT scan of the head showing 5.2 cm mass on the right frontal lobe.   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  Fall precautions. Keppra for seizure prophylaxis.  Dexamethasone for mass-effect.   Seen by neurosurgery.  They are planning for surgical resection.  Hypertension: Stable.  Resumed home medications.  Hyperlipidemia: Stable.  On a statin.  Continued.   DVT prophylaxis: SCD Code Status: Full code Family Communication: None Disposition Plan: Status is: Inpatient.  The patient will require care spanning > 2 midnights and should be moved to inpatient because: Ongoing diagnostic testing needed not appropriate for outpatient work up and Inpatient level of care appropriate due to severity of illness  Patient with active seizure due to space-occupying lesion on her brain that needs surgical resection, postop surgical care in intensive care unit.  Anticipate hospitalization more than 2 midnights.  Dispo: The patient is from: Home              Anticipated d/c is to: Home              Anticipated d/c date is: > 3 days              Patient currently is not medically stable to d/c.         Consultants:   Neurosurgery  Procedures:   None  Antimicrobials:   None   Subjective: Seen and examined.  No new events.  No new seizure.  Objective: Vitals:   04/13/20 1939 04/14/20 0041 04/14/20 0416 04/14/20 0803  BP: 134/69 139/84 139/82 (!) 148/76  Pulse: 87 70 64 72  Resp: 18 18 18 18   Temp: 97.9 F (36.6 C)  97.8 F (36.6 C) 97.8 F (36.6 C) 98 F (36.7 C)  TempSrc: Oral Oral Oral Oral  SpO2: 97% 96% 98% 96%  Weight:      Height:        Intake/Output Summary (Last 24 hours) at 04/14/2020 0941 Last data filed at 04/14/2020 0400 Gross per 24 hour  Intake 1780 ml  Output --  Net 1780 ml   Filed Weights   04/13/20 0000  Weight: 78.7 kg    Examination:  General exam: Appears calm and comfortable  Respiratory system: Clear to auscultation. Respiratory effort normal. Cardiovascular system: S1 & S2 heard, RRR. No JVD, murmurs, rubs, gallops or clicks. No pedal edema. Gastrointestinal system: Abdomen is nondistended, soft and nontender. No organomegaly or masses felt. Normal bowel sounds heard. Central nervous system: Alert and oriented. No focal neurological deficits. Extremities: Motor power is normal. Skin: No rashes, lesions or ulcers Psychiatry: Judgement and insight appear normal. Mood & affect appropriate.     Data Reviewed: I have personally reviewed following labs and imaging studies  CBC: Recent Labs  Lab 04/12/20 1716 04/13/20 0437  WBC 9.9 15.4*  NEUTROABS 8.6*  --   HGB 13.7 12.6  HCT 42.8 39.6  MCV 93.7 93.8  PLT 259 99991111   Basic Metabolic Panel: Recent Labs  Lab 04/12/20 1716 04/13/20 0437  NA 141 142  K 4.0 3.9  CL 107 109  CO2 22 24  GLUCOSE 139* 144*  BUN 10 12  CREATININE 0.72 0.73  CALCIUM 9.6 9.2   GFR: Estimated Creatinine Clearance: 75 mL/min (by C-G formula based on SCr of 0.73 mg/dL). Liver Function Tests: Recent Labs  Lab 04/12/20 1716 04/13/20 0437  AST 18 14*  ALT 19 15  ALKPHOS 82 70  BILITOT 0.7 0.5  PROT 7.3 6.4*  ALBUMIN 4.0 3.4*   No results for input(s): LIPASE, AMYLASE in the last 168 hours. No results for input(s): AMMONIA in the last 168 hours. Coagulation Profile: Recent Labs  Lab 04/12/20 1716  INR 1.1   Cardiac Enzymes: No results for input(s): CKTOTAL, CKMB, CKMBINDEX, TROPONINI in the last 168 hours. BNP  (last 3 results) No results for input(s): PROBNP in the last 8760 hours. HbA1C: No results for input(s): HGBA1C in the last 72 hours. CBG: No results for input(s): GLUCAP in the last 168 hours. Lipid Profile: No results for input(s): CHOL, HDL, LDLCALC, TRIG, CHOLHDL, LDLDIRECT in the last 72 hours. Thyroid Function Tests: No results for input(s): TSH, T4TOTAL, FREET4, T3FREE, THYROIDAB in the last 72 hours. Anemia Panel: No results for input(s): VITAMINB12, FOLATE, FERRITIN, TIBC, IRON, RETICCTPCT in the last 72 hours. Sepsis Labs: No results for input(s): PROCALCITON, LATICACIDVEN in the last 168 hours.  No results found for this or any previous visit (from the past 240 hour(s)).       Radiology Studies: MR BRAIN W WO CONTRAST  Result Date: 04/13/2020 CLINICAL DATA:  Recurrent meningioma EXAM: MRI HEAD WITHOUT AND WITH CONTRAST TECHNIQUE: Multiplanar, multiecho pulse sequences of the brain and surrounding structures were obtained without and with intravenous contrast. CONTRAST:  62mL GADAVIST GADOBUTROL 1 MMOL/ML IV SOLN COMPARISON:  2016 FINDINGS: Brain: There are two areas of recurrent meningioma. The first along the right frontoparietal convexity measuring approximately 5.7 x 3.2 x 3.4 cm. Adjacent but probably discontiguous second area along the right aspect of the falx with some contralateral extension measures approximately 5.1 x 3.2 x 2.2 cm. There is compression and possible invasion of the adjacent superior sagittal sinus, which remains patent. Mild edema in the underlying frontoparietal white matter. Mild regional mass effect is present. There is no acute infarction or intracranial hemorrhage. Encephalomalacia in the posterior right frontal lobe at the vertex related to prior resection. There is no hydrocephalus or extra-axial fluid collection. Vascular: Major vessel flow voids at the skull base are preserved. Skull and upper cervical spine: Prior right craniotomy. Normal marrow  signal is preserved. Sinuses/Orbits: Paranasal sinuses are aerated. Orbits are unremarkable. Other: Sella is unremarkable.  Mastoid air cells are clear. IMPRESSION: Recurrent meningioma along the right frontoparietal convexity and falx with some contralateral extension. Compression and possible invasion of the adjacent superior sagittal sinus, which remains patent. Mild underlying parenchymal edema and mass effect. Electronically Signed   By: Macy Mis M.D.   On: 04/13/2020 07:24        Scheduled Meds: . atorvastatin  40 mg Oral Daily  . cholecalciferol  1,000 Units Oral Daily  . dexamethasone (DECADRON) injection  4 mg Intravenous Q6H  . levETIRAcetam  500 mg Oral BID  . metoprolol succinate  12.5 mg Oral Daily  . potassium chloride SA  20 mEq Oral QODAY   Continuous Infusions:    LOS: 2 days    Time spent: 25 minutes    Barb Merino, MD [facility] Pager [phone]

## 2020-04-14 NOTE — Progress Notes (Signed)
  NEUROSURGERY PROGRESS NOTE   No issues overnight. Pt without any complaints.  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 There is associated brain edema.   IMPRESSION:  64 y.o. 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 She has had a SZ and has associated edema on MRI. She will need resection.  PLAN: - OR for resection on Thursday noon. - Cont dexamethasone and Keppra  I have reviewed the imaging findings with the patient and her husband. We discussed the need for treatment, with the optimal course being surgical resection followed by XRT. We discussed the details of surgery and expected postop course including the likely need for therapy and possible CIR/SNF. We discussed risks of surgery primarily being left weakness/numbness/paralysis, coma, death, SZ, potentially life-threatening bleeding or stroke related to sinus injury. We also discussed general risks of anesthesia including heart attack, stroke, blood clots. All her questions today were answered.

## 2020-04-15 LAB — SURGICAL PCR SCREEN
MRSA, PCR: NEGATIVE
Staphylococcus aureus: NEGATIVE

## 2020-04-15 LAB — TYPE AND SCREEN
ABO/RH(D): A POS
Antibody Screen: NEGATIVE

## 2020-04-15 MED ORDER — CEFAZOLIN SODIUM-DEXTROSE 2-4 GM/100ML-% IV SOLN
2.0000 g | INTRAVENOUS | Status: AC
  Administered 2020-04-16: 2 g via INTRAVENOUS
  Filled 2020-04-15: qty 100

## 2020-04-15 MED ORDER — CHLORHEXIDINE GLUCONATE CLOTH 2 % EX PADS
6.0000 | MEDICATED_PAD | Freq: Once | CUTANEOUS | Status: AC
  Administered 2020-04-15: 6 via TOPICAL

## 2020-04-15 MED ORDER — CEFAZOLIN SODIUM-DEXTROSE 2-4 GM/100ML-% IV SOLN
2.0000 g | INTRAVENOUS | Status: DC
Start: 2020-04-16 — End: 2020-04-15

## 2020-04-15 MED ORDER — CHLORHEXIDINE GLUCONATE CLOTH 2 % EX PADS: 6.0000 | MEDICATED_PAD | Freq: Once | CUTANEOUS | Status: DC

## 2020-04-15 NOTE — Progress Notes (Signed)
PROGRESS NOTE    [name] [name]  [id number] DOB: [birth year] DOA: 04/12/2020 PCP: Curlene Labrum, MD    Brief Narrative:  64 year old female with history of meningioma that was surgically removed in 2014, left hemiparesis, hypertension and hyperlipidemia presented from home with episode of seizure and confusion.  Transferred from [facility] with CT scan of the head showing 5.2 cm mass on the right frontal lobe.   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  Fall precautions. Keppra for seizure prophylaxis.  Dexamethasone for mass-effect.   Seen by neurosurgery.  They are planning for surgical resection 6/3.  Hypertension: Stable.  Resumed home medications.  Hyperlipidemia: Stable.  On a statin.  Continued.   DVT prophylaxis: SCD Code Status: Full code Family Communication: Husband at the bedside. Disposition Plan: Status is: Inpatient.  The patient will require care spanning > 2 midnights and should be moved to inpatient because: Ongoing diagnostic testing needed not appropriate for outpatient work up and Inpatient level of care appropriate due to severity of illness  Patient with active seizure due to space-occupying lesion on her brain that needs surgical resection, postop surgical care in intensive care unit.  Anticipate hospitalization more than 2 midnights.  Dispo: The patient is from: Home              Anticipated d/c is to: Home              Anticipated d/c date is: > 3 days              Patient currently is not medically stable to d/c.         Consultants:   Neurosurgery  Procedures:   None  Antimicrobials:   None   Subjective: Seen and examined.  No new events.  No new seizure.  Anxious about surgery.  Husband at the bedside.  Objective: Vitals:   04/15/20 0011 04/15/20 0742 04/15/20 0810 04/15/20 0820  BP: 137/83 140/76 (!) 153/76 (!)  143/87  Pulse: 63 75 74 64  Resp: 19 18 18 18   Temp: 97.7 F (36.5 C) 98.1 F (36.7 C) 98.1 F (36.7 C) 98.2 F (36.8 C)  TempSrc: Oral Oral Oral Oral  SpO2: 98% 95% 97% 99%  Weight:      Height:       No intake or output data in the 24 hours ending 04/15/20 1115 Filed Weights   04/13/20 0000  Weight: 78.7 kg    Examination:  General exam: Appears calm and comfortable  Respiratory system: Clear to auscultation. Respiratory effort normal. Cardiovascular system: S1 & S2 heard, RRR. No JVD, murmurs, rubs, gallops or clicks. No pedal edema. Gastrointestinal system: Abdomen is nondistended, soft and nontender. No organomegaly or masses felt. Normal bowel sounds heard. Central nervous system: Alert and oriented. No focal neurological deficits.      Data Reviewed: I have personally reviewed following labs and imaging studies  CBC: Recent Labs  Lab 04/12/20 1716 04/13/20 0437  WBC 9.9 15.4*  NEUTROABS 8.6*  --   HGB 13.7 12.6  HCT 42.8 39.6  MCV 93.7 93.8  PLT 259 99991111   Basic Metabolic Panel: Recent Labs  Lab 04/12/20 1716 04/13/20 0437  NA 141 142  K 4.0 3.9  CL 107 109  CO2 22 24  GLUCOSE 139* 144*  BUN 10 12  CREATININE 0.72 0.73  CALCIUM 9.6 9.2   GFR:  Estimated Creatinine Clearance: 75 mL/min (by C-G formula based on SCr of 0.73 mg/dL). Liver Function Tests: Recent Labs  Lab 04/12/20 1716 04/13/20 0437  AST 18 14*  ALT 19 15  ALKPHOS 82 70  BILITOT 0.7 0.5  PROT 7.3 6.4*  ALBUMIN 4.0 3.4*   No results for input(s): LIPASE, AMYLASE in the last 168 hours. No results for input(s): AMMONIA in the last 168 hours. Coagulation Profile: Recent Labs  Lab 04/12/20 1716  INR 1.1   Cardiac Enzymes: No results for input(s): CKTOTAL, CKMB, CKMBINDEX, TROPONINI in the last 168 hours. BNP (last 3 results) No results for input(s): PROBNP in the last 8760 hours. HbA1C: No results for input(s): HGBA1C in the last 72 hours. CBG: No results for input(s):  GLUCAP in the last 168 hours. Lipid Profile: No results for input(s): CHOL, HDL, LDLCALC, TRIG, CHOLHDL, LDLDIRECT in the last 72 hours. Thyroid Function Tests: No results for input(s): TSH, T4TOTAL, FREET4, T3FREE, THYROIDAB in the last 72 hours. Anemia Panel: No results for input(s): VITAMINB12, FOLATE, FERRITIN, TIBC, IRON, RETICCTPCT in the last 72 hours. Sepsis Labs: No results for input(s): PROCALCITON, LATICACIDVEN in the last 168 hours.  Recent Results (from the past 240 hour(s))  Surgical pcr screen     Status: None   Collection Time: 04/15/20  6:26 AM   Specimen: Nasal Mucosa; Nasal Swab  Result Value Ref Range Status   MRSA, PCR NEGATIVE NEGATIVE Final   Staphylococcus aureus NEGATIVE NEGATIVE Final    Comment: (NOTE) The Xpert SA Assay (FDA approved for NASAL specimens in patients 83 years of age and older), is one component of a comprehensive surveillance program. It is not intended to diagnose infection nor to guide or monitor treatment. Performed at [facility], [place] [address], [place], [place] [address]          Radiology Studies: No results found.      Scheduled Meds:  atorvastatin  40 mg Oral Daily   cholecalciferol  1,000 Units Oral Daily   dexamethasone (DECADRON) injection  4 mg Intravenous Q6H   levETIRAcetam  500 mg Oral BID   metoprolol succinate  12.5 mg Oral Daily   potassium chloride SA  20 mEq Oral QODAY   Continuous Infusions:    LOS: 3 days    Time spent: 25 minutes    Barb Merino, MD [facility] Pager [phone]

## 2020-04-15 NOTE — Plan of Care (Signed)
  Problem: Clinical Measurements: Goal: Respiratory complications will improve Outcome: Progressing   Problem: Clinical Measurements: Goal: Diagnostic test results will improve Outcome: Progressing   Problem: Clinical Measurements: Goal: Will remain free from infection Outcome: Progressing   Problem: Clinical Measurements: Goal: Ability to maintain clinical measurements within normal limits will improve Outcome: Progressing   

## 2020-04-15 NOTE — Progress Notes (Signed)
  NEUROSURGERY PROGRESS NOTE   No issues overnight.  Anxious for surgery tomorrow, otherwise no concerns  EXAM:  BP 140/76 (BP Location: Right Arm)   Pulse 75   Temp 98.1 F (36.7 C) (Oral)   Resp 18   Ht 5\' 4"  (1.626 m)   Wt 78.7 kg   SpO2 95%   BMI 29.78 kg/m   Awake, alert, oriented  Speech fluent, appropriate  CN grossly intact  MAEW  IMPRESSION/PLAN 64 y.o. female with large recurrent  right frontoparietal meningioma with invasion of the SSS. She has had a SZ and has associated edema on MRI.  - OR tomorrow for resection. All questions answered today - npo at midnight

## 2020-04-16 ENCOUNTER — Inpatient Hospital Stay (HOSPITAL_COMMUNITY): Payer: BC Managed Care – PPO

## 2020-04-16 ENCOUNTER — Encounter (HOSPITAL_COMMUNITY): Payer: Self-pay | Admitting: Internal Medicine

## 2020-04-16 ENCOUNTER — Inpatient Hospital Stay (HOSPITAL_COMMUNITY)
Admission: AD | Disposition: A | Discharge status: Rehabilitation Facility | Payer: Self-pay | Source: Other Acute Inpatient Hospital | Attending: Internal Medicine

## 2020-04-16 HISTORY — PX: APPLICATION OF CRANIAL NAVIGATION: SHX6578

## 2020-04-16 HISTORY — PX: CRANIOTOMY: SHX93

## 2020-04-16 LAB — SARS CORONAVIRUS 2 BY RT PCR (HOSPITAL ORDER, PERFORMED IN ~~LOC~~ HOSPITAL LAB): SARS Coronavirus 2: NEGATIVE

## 2020-04-16 SURGERY — CRANIOTOMY TUMOR EXCISION
Anesthesia: General

## 2020-04-16 MED ORDER — ONDANSETRON HCL 4 MG/2ML IJ SOLN: 4.0000 mg | Freq: Four times a day (QID) | INTRAMUSCULAR | Status: DC | PRN

## 2020-04-16 MED ORDER — FENTANYL CITRATE (PF) 250 MCG/5ML IJ SOLN
INTRAMUSCULAR | Status: AC
  Filled 2020-04-16: qty 5

## 2020-04-16 MED ORDER — HYDROCODONE-ACETAMINOPHEN 5-325 MG PO TABS
1.0000 | ORAL_TABLET | ORAL | Status: DC | PRN
  Administered 2020-04-17 (×2): 1 via ORAL
  Filled 2020-04-16 (×2): qty 1

## 2020-04-16 MED ORDER — BUPIVACAINE HCL (PF) 0.5 % IJ SOLN
INTRAMUSCULAR | Status: DC | PRN
  Administered 2020-04-16: 4 mL

## 2020-04-16 MED ORDER — FENTANYL CITRATE (PF) 100 MCG/2ML IJ SOLN
INTRAMUSCULAR | Status: AC
  Filled 2020-04-16: qty 2

## 2020-04-16 MED ORDER — ACETAMINOPHEN 650 MG RE SUPP: 650.0000 mg | RECTAL | Status: DC | PRN

## 2020-04-16 MED ORDER — PANTOPRAZOLE SODIUM 40 MG IV SOLR
40.0000 mg | Freq: Every day | INTRAVENOUS | Status: DC
  Administered 2020-04-16 – 2020-04-17 (×2): 40 mg via INTRAVENOUS
  Filled 2020-04-16 (×2): qty 40

## 2020-04-16 MED ORDER — ACETAMINOPHEN 325 MG PO TABS
650.0000 mg | ORAL_TABLET | ORAL | Status: DC | PRN
  Administered 2020-04-18 – 2020-04-20 (×4): 650 mg via ORAL
  Filled 2020-04-16 (×4): qty 2

## 2020-04-16 MED ORDER — CHLORHEXIDINE GLUCONATE 0.12 % MT SOLN
15.0000 mL | Freq: Once | OROMUCOSAL | Status: AC
  Administered 2020-04-16: 15 mL via OROMUCOSAL

## 2020-04-16 MED ORDER — SODIUM CHLORIDE 0.9% FLUSH
10.0000 mL | Freq: Two times a day (BID) | INTRAVENOUS | Status: DC
  Administered 2020-04-16 – 2020-04-21 (×5): 10 mL

## 2020-04-16 MED ORDER — ONDANSETRON HCL 4 MG/2ML IJ SOLN
INTRAMUSCULAR | Status: DC | PRN
  Administered 2020-04-16: 4 mg via INTRAVENOUS

## 2020-04-16 MED ORDER — LIDOCAINE HCL (CARDIAC) PF 100 MG/5ML IV SOSY
PREFILLED_SYRINGE | INTRAVENOUS | Status: DC | PRN
  Administered 2020-04-16: 100 mg via INTRAVENOUS

## 2020-04-16 MED ORDER — BUPIVACAINE HCL (PF) 0.5 % IJ SOLN
INTRAMUSCULAR | Status: AC
  Filled 2020-04-16: qty 30

## 2020-04-16 MED ORDER — THROMBIN 20000 UNITS EX SOLR
CUTANEOUS | Status: AC
  Filled 2020-04-16: qty 20000

## 2020-04-16 MED ORDER — ONDANSETRON HCL 4 MG PO TABS: 4.0000 mg | ORAL_TABLET | ORAL | Status: DC | PRN

## 2020-04-16 MED ORDER — SODIUM CHLORIDE 0.9 % IV SOLN: INTRAVENOUS | Status: DC | PRN

## 2020-04-16 MED ORDER — FLEET ENEMA 7-19 GM/118ML RE ENEM: 1.0000 | ENEMA | Freq: Once | RECTAL | Status: DC | PRN

## 2020-04-16 MED ORDER — METOPROLOL SUCCINATE ER 25 MG PO TB24
12.5000 mg | ORAL_TABLET | Freq: Every day | ORAL | Status: DC
  Administered 2020-04-16: 12.5 mg via ORAL

## 2020-04-16 MED ORDER — THROMBIN 5000 UNITS EX SOLR
CUTANEOUS | Status: AC
  Filled 2020-04-16: qty 5000

## 2020-04-16 MED ORDER — SUCCINYLCHOLINE CHLORIDE 200 MG/10ML IV SOSY
PREFILLED_SYRINGE | INTRAVENOUS | Status: DC | PRN
Start: 2020-04-16 — End: 2020-04-16
  Administered 2020-04-16: 120 mg via INTRAVENOUS

## 2020-04-16 MED ORDER — SUGAMMADEX SODIUM 200 MG/2ML IV SOLN
INTRAVENOUS | Status: DC | PRN
  Administered 2020-04-16: 200 mg via INTRAVENOUS

## 2020-04-16 MED ORDER — LABETALOL HCL 5 MG/ML IV SOLN
10.0000 mg | INTRAVENOUS | Status: DC | PRN
  Administered 2020-04-17: 20 mg via INTRAVENOUS
  Filled 2020-04-16: qty 4

## 2020-04-16 MED ORDER — NALOXONE HCL 0.4 MG/ML IJ SOLN: 0.0800 mg | INTRAMUSCULAR | Status: DC | PRN

## 2020-04-16 MED ORDER — BACITRACIN ZINC 500 UNIT/GM EX OINT
TOPICAL_OINTMENT | CUTANEOUS | Status: DC | PRN
  Administered 2020-04-16: 1 via TOPICAL

## 2020-04-16 MED ORDER — HYDROMORPHONE HCL 1 MG/ML IJ SOLN
0.5000 mg | INTRAMUSCULAR | Status: DC | PRN
  Administered 2020-04-17: 0.5 mg via INTRAVENOUS
  Filled 2020-04-16: qty 1

## 2020-04-16 MED ORDER — ROCURONIUM BROMIDE 10 MG/ML (PF) SYRINGE
PREFILLED_SYRINGE | INTRAVENOUS | Status: DC | PRN
  Administered 2020-04-16: 100 mg via INTRAVENOUS
  Administered 2020-04-16: 25 mg via INTRAVENOUS

## 2020-04-16 MED ORDER — CHLORHEXIDINE GLUCONATE CLOTH 2 % EX PADS
6.0000 | MEDICATED_PAD | Freq: Every day | CUTANEOUS | Status: DC
  Administered 2020-04-16 – 2020-04-18 (×3): 6 via TOPICAL

## 2020-04-16 MED ORDER — SODIUM CHLORIDE 0.9 % IV SOLN: INTRAVENOUS | Status: DC

## 2020-04-16 MED ORDER — ONDANSETRON HCL 4 MG/2ML IJ SOLN
4.0000 mg | INTRAMUSCULAR | Status: DC | PRN
  Administered 2020-04-16 – 2020-04-17 (×2): 4 mg via INTRAVENOUS
  Filled 2020-04-16 (×2): qty 2

## 2020-04-16 MED ORDER — 0.9 % SODIUM CHLORIDE (POUR BTL) OPTIME
TOPICAL | Status: DC | PRN
  Administered 2020-04-16: 3000 mL

## 2020-04-16 MED ORDER — PROMETHAZINE HCL 12.5 MG PO TABS
12.5000 mg | ORAL_TABLET | ORAL | Status: DC | PRN
  Filled 2020-04-16: qty 2

## 2020-04-16 MED ORDER — PROPOFOL 10 MG/ML IV BOLUS
INTRAVENOUS | Status: DC | PRN
  Administered 2020-04-16: 50 mg via INTRAVENOUS
  Administered 2020-04-16: 100 mg via INTRAVENOUS
  Administered 2020-04-16: 50 mg via INTRAVENOUS

## 2020-04-16 MED ORDER — THROMBIN 5000 UNITS EX SOLR: OROMUCOSAL | Status: DC | PRN

## 2020-04-16 MED ORDER — FENTANYL CITRATE (PF) 250 MCG/5ML IJ SOLN
INTRAMUSCULAR | Status: DC | PRN
  Administered 2020-04-16 (×2): 50 ug via INTRAVENOUS
  Administered 2020-04-16: 100 ug via INTRAVENOUS

## 2020-04-16 MED ORDER — FENTANYL CITRATE (PF) 100 MCG/2ML IJ SOLN
25.0000 ug | INTRAMUSCULAR | Status: DC | PRN
  Administered 2020-04-16: 25 ug via INTRAVENOUS

## 2020-04-16 MED ORDER — SODIUM CHLORIDE 0.9% FLUSH: 10.0000 mL | INTRAVENOUS | Status: DC | PRN

## 2020-04-16 MED ORDER — OXYCODONE HCL 5 MG/5ML PO SOLN: 5.0000 mg | Freq: Once | ORAL | Status: DC | PRN

## 2020-04-16 MED ORDER — SENNA 8.6 MG PO TABS
1.0000 | ORAL_TABLET | Freq: Two times a day (BID) | ORAL | Status: DC
  Administered 2020-04-17 – 2020-04-20 (×6): 8.6 mg via ORAL
  Filled 2020-04-16 (×8): qty 1

## 2020-04-16 MED ORDER — ORAL CARE MOUTH RINSE: 15.0000 mL | Freq: Once | OROMUCOSAL | Status: AC

## 2020-04-16 MED ORDER — OXYCODONE HCL 5 MG PO TABS: 5.0000 mg | ORAL_TABLET | ORAL | Status: DC | PRN

## 2020-04-16 MED ORDER — BISACODYL 5 MG PO TBEC: 5.0000 mg | DELAYED_RELEASE_TABLET | Freq: Every day | ORAL | Status: DC | PRN

## 2020-04-16 MED ORDER — MIDAZOLAM HCL 2 MG/2ML IJ SOLN
INTRAMUSCULAR | Status: AC
  Filled 2020-04-16: qty 2

## 2020-04-16 MED ORDER — LIDOCAINE-EPINEPHRINE 1 %-1:100000 IJ SOLN
INTRAMUSCULAR | Status: AC
  Filled 2020-04-16: qty 1

## 2020-04-16 MED ORDER — HEMOSTATIC AGENTS (NO CHARGE) OPTIME
TOPICAL | Status: DC | PRN
  Administered 2020-04-16: 1 via TOPICAL

## 2020-04-16 MED ORDER — BACITRACIN ZINC 500 UNIT/GM EX OINT
TOPICAL_OINTMENT | CUTANEOUS | Status: AC
  Filled 2020-04-16: qty 28.35

## 2020-04-16 MED ORDER — OXYCODONE HCL 5 MG PO TABS: 5.0000 mg | ORAL_TABLET | Freq: Once | ORAL | Status: DC | PRN

## 2020-04-16 MED ORDER — MIDAZOLAM HCL 2 MG/2ML IJ SOLN
INTRAMUSCULAR | Status: DC | PRN
  Administered 2020-04-16: 2 mg via INTRAVENOUS

## 2020-04-16 MED ORDER — CEFAZOLIN SODIUM-DEXTROSE 2-4 GM/100ML-% IV SOLN
2.0000 g | Freq: Three times a day (TID) | INTRAVENOUS | Status: AC
  Administered 2020-04-16 – 2020-04-17 (×2): 2 g via INTRAVENOUS
  Filled 2020-04-16 (×2): qty 100

## 2020-04-16 MED ORDER — HYDRALAZINE HCL 20 MG/ML IJ SOLN: 5.0000 mg | INTRAMUSCULAR | Status: DC | PRN

## 2020-04-16 MED ORDER — THROMBIN 20000 UNITS EX SOLR: CUTANEOUS | Status: DC | PRN

## 2020-04-16 MED ORDER — PHENYLEPHRINE HCL-NACL 10-0.9 MG/250ML-% IV SOLN
INTRAVENOUS | Status: DC | PRN
  Administered 2020-04-16: 25 ug/min via INTRAVENOUS

## 2020-04-16 MED ORDER — LIDOCAINE-EPINEPHRINE 1 %-1:100000 IJ SOLN
INTRAMUSCULAR | Status: DC | PRN
  Administered 2020-04-16: 4 mL

## 2020-04-16 MED ORDER — POLYETHYLENE GLYCOL 3350 17 G PO PACK: 17.0000 g | PACK | Freq: Every day | ORAL | Status: DC | PRN

## 2020-04-16 SURGICAL SUPPLY — 62 items
BAND INSRT 18 STRL LF DISP RB (MISCELLANEOUS) ×2
BAND RUBBER #18 3X1/16 STRL (MISCELLANEOUS) ×2 IMPLANT
BLADE CLIPPER SURG (BLADE) ×2 IMPLANT
BUR ACORN 6.0 PRECISION (BURR) ×2 IMPLANT
BUR SPIRAL ROUTER 2.3 (BUR) ×2 IMPLANT
CANISTER SUCT 3000ML PPV (MISCELLANEOUS) ×4 IMPLANT
CARTRIDGE OIL MAESTRO DRILL (MISCELLANEOUS) ×1 IMPLANT
CLIP RANEY DISP (INSTRUMENTS) ×1 IMPLANT
CNTNR URN SCR LID CUP LEK RST (MISCELLANEOUS) ×1 IMPLANT
CONT SPEC 4OZ STRL OR WHT (MISCELLANEOUS) ×4
DECANTER SPIKE VIAL GLASS SM (MISCELLANEOUS) ×2 IMPLANT
DIFFUSER DRILL AIR PNEUMATIC (MISCELLANEOUS) ×2 IMPLANT
DRAPE HALF SHEET 40X57 (DRAPES) ×2 IMPLANT
DRAPE MICROSCOPE LEICA (MISCELLANEOUS) ×1 IMPLANT
DRAPE NEUROLOGICAL W/INCISE (DRAPES) ×2 IMPLANT
DRAPE WARM FLUID 44X44 (DRAPES) ×2 IMPLANT
DRSG ADAPTIC 3X8 NADH LF (GAUZE/BANDAGES/DRESSINGS) ×1 IMPLANT
DRSG TELFA 3X8 NADH (GAUZE/BANDAGES/DRESSINGS) ×2 IMPLANT
DURAPREP 6ML APPLICATOR 50/CS (WOUND CARE) ×2 IMPLANT
ELECT REM PT RETURN 9FT ADLT (ELECTROSURGICAL) ×2
ELECTRODE REM PT RTRN 9FT ADLT (ELECTROSURGICAL) ×1 IMPLANT
FORCEPS BIPOLAR SPETZLER 8 1.0 (NEUROSURGERY SUPPLIES) ×1 IMPLANT
GLOVE BIO SURGEON STRL SZ7.5 (GLOVE) ×2 IMPLANT
GLOVE BIOGEL PI IND STRL 7.0 (GLOVE) IMPLANT
GLOVE BIOGEL PI IND STRL 7.5 (GLOVE) ×2 IMPLANT
GLOVE BIOGEL PI INDICATOR 7.0 (GLOVE) ×3
GLOVE BIOGEL PI INDICATOR 7.5 (GLOVE) ×5
GLOVE ECLIPSE 7.0 STRL STRAW (GLOVE) ×4 IMPLANT
GOWN STRL REUS W/ TWL LRG LVL3 (GOWN DISPOSABLE) ×2 IMPLANT
GOWN STRL REUS W/ TWL XL LVL3 (GOWN DISPOSABLE) IMPLANT
GOWN STRL REUS W/TWL LRG LVL3 (GOWN DISPOSABLE) ×4
GOWN STRL REUS W/TWL XL LVL3 (GOWN DISPOSABLE) ×10
GRAFT DURAGEN MATRIX 5WX7L (Graft) ×1 IMPLANT
HEMOSTAT POWDER KIT SURGIFOAM (HEMOSTASIS) ×3 IMPLANT
HEMOSTAT SURGICEL 2X14 (HEMOSTASIS) ×2 IMPLANT
HOOK DURA 1/2IN (MISCELLANEOUS) ×2 IMPLANT
KIT BASIN OR (CUSTOM PROCEDURE TRAY) ×2 IMPLANT
KIT TURNOVER KIT B (KITS) ×2 IMPLANT
KNIFE ARACHNOID DISP AM-24-S (MISCELLANEOUS) IMPLANT
MARKER SPHERE PSV REFLC 13MM (MARKER) ×4 IMPLANT
NEEDLE HYPO 22GX1.5 SAFETY (NEEDLE) ×2 IMPLANT
NS IRRIG 1000ML POUR BTL (IV SOLUTION) ×6 IMPLANT
OIL CARTRIDGE MAESTRO DRILL (MISCELLANEOUS) ×2
PACK BATTERY CMF DISP FOR DVR (ORTHOPEDIC DISPOSABLE SUPPLIES) ×1 IMPLANT
PACK CRANIOTOMY CUSTOM (CUSTOM PROCEDURE TRAY) ×2 IMPLANT
PAD DRESSING TELFA 3X8 NADH (GAUZE/BANDAGES/DRESSINGS) IMPLANT
PIN MAYFIELD SKULL DISP (PIN) ×2 IMPLANT
PLATE UNIV CMF 16 2H (Plate) ×4 IMPLANT
SCREW UNIII AXS SD 1.5X4 (Screw) ×8 IMPLANT
SPONGE SURGIFOAM ABS GEL 100 (HEMOSTASIS) ×3 IMPLANT
STAPLER VISISTAT 35W (STAPLE) ×2 IMPLANT
STOCKINETTE 6  STRL (DRAPES) ×2
STOCKINETTE 6 STRL (DRAPES) IMPLANT
SUT NURALON 4 0 TR CR/8 (SUTURE) ×5 IMPLANT
SUT VIC AB 0 CT1 18XCR BRD8 (SUTURE) ×2 IMPLANT
SUT VIC AB 0 CT1 8-18 (SUTURE) ×4
SUT VIC AB 3-0 SH 8-18 (SUTURE) ×4 IMPLANT
TOWEL GREEN STERILE (TOWEL DISPOSABLE) ×2 IMPLANT
TOWEL GREEN STERILE FF (TOWEL DISPOSABLE) ×2 IMPLANT
TRAY FOLEY MTR SLVR 16FR STAT (SET/KITS/TRAYS/PACK) ×2 IMPLANT
TUBE CONNECTING 12X1/4 (SUCTIONS) ×2 IMPLANT
WATER STERILE IRR 1000ML POUR (IV SOLUTION) ×2 IMPLANT

## 2020-04-16 NOTE — Transfer of Care (Signed)
Immediate Anesthesia Transfer of Care Note  Patient: [name] [name]  Procedure(s) Performed: STEREOTACTIC CRANIOTOMY FOR RESECTION OF MENINGIOMA (N/A ) APPLICATION OF CRANIAL NAVIGATION (N/A )  Patient Location: PACU  Anesthesia Type:General  Level of Consciousness: alert , oriented, drowsy and patient cooperative  Airway & Oxygen Therapy: Patient Spontanous Breathing and Patient connected to nasal cannula oxygen  Post-op Assessment: Report given to RN and Post -op Vital signs reviewed and stable  Post vital signs: Reviewed and stable  Last Vitals:  Vitals Value Taken Time  BP 136/81 04/16/20 1731  Temp    Pulse 96 04/16/20 1735  Resp 17 04/16/20 1735  SpO2 98 % 04/16/20 1735  Vitals shown include unvalidated device data.  Last Pain:  Vitals:   04/16/20 1226  TempSrc: Oral  PainSc:          Complications: No apparent anesthesia complications

## 2020-04-16 NOTE — Progress Notes (Signed)
Report received from night nurse stated patient will be going for cranial recession and everything was already done for patient including CHG bath. Patient requested for another CHG bath which was done by nurse tech. Patient is NPO due to scheduled surgery, but writer was concerned about patient Hx of seizure and paged MD about patient keppra to be given by IV, MD states med can be given by mouth. Patient was given Jodi Marble prior to leaving for surgery.

## 2020-04-16 NOTE — Anesthesia Preprocedure Evaluation (Signed)
Anesthesia Evaluation  Patient identified by MRN, date of birth, ID band Patient awake    Reviewed: Allergy & Precautions, H&P , NPO status , Patient's Chart, lab work & pertinent test results  History of Anesthesia Complications (+) PONV and history of anesthetic complications  Airway Mallampati: II   Neck ROM: full    Dental   Pulmonary former smoker,    breath sounds clear to auscultation       Cardiovascular negative cardio ROS   Rhythm:regular Rate:Normal     Neuro/Psych  Headaches, Seizures -,  TIACVA    GI/Hepatic   Endo/Other    Renal/GU      Musculoskeletal   Abdominal   Peds  Hematology   Anesthesia Other Findings   Reproductive/Obstetrics                             Anesthesia Physical Anesthesia Plan  ASA: III  Anesthesia Plan: General   Post-op Pain Management:    Induction: Intravenous  PONV Risk Score and Plan: 4 or greater and Ondansetron, Dexamethasone, Midazolam and Treatment may vary due to age or medical condition  Airway Management Planned: Oral ETT  Additional Equipment: Arterial line  Intra-op Plan:   Post-operative Plan: Extubation in OR  Informed Consent:   Plan Discussed with: CRNA, Anesthesiologist and Surgeon  Anesthesia Plan Comments:         Anesthesia Quick Evaluation

## 2020-04-16 NOTE — Progress Notes (Signed)
PROGRESS NOTE    [name] [name]  [id number] DOB: 11-07-1956 DOA: 04/12/2020 PCP: Curlene Labrum, MD    Brief Narrative:  64 year old female with history of meningioma that was surgically removed in 2014, left hemiparesis, hypertension and hyperlipidemia presented from home with episode of seizure and confusion.  Transferred from [facility] with CT scan of the head showing 5.2 cm mass on the right frontal lobe.   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  Presented with recurrent seizure episode.  Transferred to [facility] for neurosurgery.  Continue all time for seizure precautions and fall precautions.  Clinically stabilizing. Keppra for seizure prophylaxis.  Dexamethasone for mass-effect. Seen by neurosurgery.  Going for surgical resection today.  We will go to ICU after surgical resection.  Operative planning as per surgery.  Hypertension: Stable.  Resumed home medications.  Hyperlipidemia: Stable.  On a statin.  Continued.   DVT prophylaxis: SCD Code Status: Full code Family Communication: Husband at the bedside. Disposition Plan: Status is: Inpatient.  The patient will require care spanning > 2 midnights and should be moved to inpatient because: Ongoing diagnostic testing needed not appropriate for outpatient work up and Inpatient level of care appropriate due to severity of illness  Patient with active seizure due to space-occupying lesion on her brain that needs surgical resection, postop surgical care in intensive care unit.  Anticipate hospitalization more than 2 midnights.  Dispo: The patient is from: Home              Anticipated d/c is to: Home              Anticipated d/c date is: > 3 days              Patient currently is not medically stable to d/c.  Going for surgery today.         Consultants:   Neurosurgery  Procedures:    None  Antimicrobials:   None   Subjective: No overnight events.  Sitting in chair.  Looking forward for surgery.  Objective: Vitals:   04/15/20 1556 04/15/20 1931 04/15/20 2312 04/16/20 0329  BP: (!) 141/78 (!) 150/76 133/77 (!) 149/97  Pulse: 84 90 80 80  Resp: 18 18 18 18   Temp: 97.9 F (36.6 C) 97.6 F (36.4 C) 98 F (36.7 C) 97.7 F (36.5 C)  TempSrc: Oral Oral Oral Oral  SpO2: 97% 96% 100% 98%  Weight:      Height:       No intake or output data in the 24 hours ending 04/16/20 1004 Filed Weights   04/13/20 0000  Weight: 78.7 kg    Examination:  Physical Exam  Constitutional: She is oriented to person, place, and time and well-developed, well-nourished, and in no distress.  HENT:  Head: Normocephalic.  Eyes: Pupils are equal, round, and reactive to light.  Cardiovascular: Regular rhythm.  Pulmonary/Chest: Breath sounds normal.  Abdominal: Bowel sounds are normal.  Neurological: She is oriented to person, place, and time.  Psychiatric: Affect and judgment normal.        Data Reviewed: I have personally reviewed following labs and imaging studies  CBC: Recent Labs  Lab 04/12/20 1716 04/13/20 0437  WBC 9.9 15.4*  NEUTROABS 8.6*  --   HGB 13.7 12.6  HCT 42.8 39.6  MCV 93.7 93.8  PLT 259 99991111   Basic Metabolic Panel: Recent Labs  Lab 04/12/20 1716 04/13/20 0437  NA 141 142  K 4.0 3.9  CL 107 109  CO2 22 24  GLUCOSE 139* 144*  BUN 10 12  CREATININE 0.72 0.73  CALCIUM 9.6 9.2   GFR: Estimated Creatinine Clearance: 75 mL/min (by C-G formula based on SCr of 0.73 mg/dL). Liver Function Tests: Recent Labs  Lab 04/12/20 1716 04/13/20 0437  AST 18 14*  ALT 19 15  ALKPHOS 82 70  BILITOT 0.7 0.5  PROT 7.3 6.4*  ALBUMIN 4.0 3.4*   No results for input(s): LIPASE, AMYLASE in the last 168 hours. No results for input(s): AMMONIA in the last 168 hours. Coagulation Profile: Recent Labs  Lab 04/12/20 1716  INR 1.1   Cardiac  Enzymes: No results for input(s): CKTOTAL, CKMB, CKMBINDEX, TROPONINI in the last 168 hours. BNP (last 3 results) No results for input(s): PROBNP in the last 8760 hours. HbA1C: No results for input(s): HGBA1C in the last 72 hours. CBG: No results for input(s): GLUCAP in the last 168 hours. Lipid Profile: No results for input(s): CHOL, HDL, LDLCALC, TRIG, CHOLHDL, LDLDIRECT in the last 72 hours. Thyroid Function Tests: No results for input(s): TSH, T4TOTAL, FREET4, T3FREE, THYROIDAB in the last 72 hours. Anemia Panel: No results for input(s): VITAMINB12, FOLATE, FERRITIN, TIBC, IRON, RETICCTPCT in the last 72 hours. Sepsis Labs: No results for input(s): PROCALCITON, LATICACIDVEN in the last 168 hours.  Recent Results (from the past 240 hour(s))  Surgical pcr screen     Status: None   Collection Time: 04/15/20  6:26 AM   Specimen: Nasal Mucosa; Nasal Swab  Result Value Ref Range Status   MRSA, PCR NEGATIVE NEGATIVE Final   Staphylococcus aureus NEGATIVE NEGATIVE Final    Comment: (NOTE) The Xpert SA Assay (FDA approved for NASAL specimens in patients 26 years of age and older), is one component of a comprehensive surveillance program. It is not intended to diagnose infection nor to guide or monitor treatment. Performed at [facility], [place] [address] [place], [place] [address]          Radiology Studies: No results found.      Scheduled Meds: . atorvastatin  40 mg Oral Daily  . Chlorhexidine Gluconate Cloth  6 each Topical Once  . cholecalciferol  1,000 Units Oral Daily  . dexamethasone (DECADRON) injection  4 mg Intravenous Q6H  . levETIRAcetam  500 mg Oral BID  . metoprolol succinate  12.5 mg Oral Daily  . potassium chloride SA  20 mEq Oral QODAY   Continuous Infusions: .  ceFAZolin (ANCEF) IV       LOS: 4 days    Time spent: 25 minutes    Barb Merino, MD [facility] Pager [phone]

## 2020-04-16 NOTE — Progress Notes (Signed)
  NEUROSURGERY PROGRESS NOTE   No issues overnight. Pt without complaints this am  EXAM:  BP (!) 149/97 (BP Location: Right Arm)   Pulse 80   Temp 97.7 F (36.5 C) (Oral)   Resp 18   Ht 5\' 4"  (1.626 m)   Wt 78.7 kg   SpO2 98%   BMI 29.78 kg/m   Awake, alert, oriented  Speech fluent, appropriate  CN grossly intact  5/5 BUE/BLE   IMPRESSION:  64 y.o. female with large recurrent right frontal meningioma with SZ and underlying edema.  PLAN: - Will proceed with operative resection today. - Plan on tx to ICU postop  I have again reviewed the surgery, risks, benefits, and alternatives with the patient. All questions this am were answered and she is ready to proceed as above.

## 2020-04-16 NOTE — Anesthesia Procedure Notes (Signed)
Arterial Line Insertion Start/End6/01/2020 1:10 PM, 04/16/2020 1:25 PM Performed by: Albertha Ghee, MD, Raenette Rover, CRNA, CRNA  Patient location: Pre-op. Preanesthetic checklist: patient identified, IV checked, site marked, risks and benefits discussed, surgical consent, monitors and equipment checked, pre-op evaluation and timeout performed Lidocaine 1% used for infiltration Left, radial was placed Catheter size: 20 G Hand hygiene performed  and maximum sterile barriers used   Attempts: 2 Procedure performed without using ultrasound guided technique. Following insertion, dressing applied and Biopatch. Post procedure assessment: normal  Patient tolerated the procedure well with no immediate complications.

## 2020-04-16 NOTE — Anesthesia Procedure Notes (Signed)
Central Venous Catheter Insertion Performed by: Duane Boston, MD, anesthesiologist Start/End6/01/2020 2:13 PM, 04/16/2020 2:23 PM Patient location: Pre-op. Preanesthetic checklist: patient identified, IV checked, site marked, risks and benefits discussed, surgical consent, monitors and equipment checked, pre-op evaluation, timeout performed and anesthesia consent Position: Trendelenburg Lidocaine 1% used for infiltration and patient sedated Hand hygiene performed , maximum sterile barriers used  and Seldinger technique used Catheter size: 8 Fr Total catheter length 16. Central line was placed.Double lumen Procedure performed using ultrasound guided technique. Ultrasound Notes:anatomy identified, needle tip was noted to be adjacent to the nerve/plexus identified, no ultrasound evidence of intravascular and/or intraneural injection and image(s) printed for medical record Attempts: 1 Following insertion, dressing applied, line sutured and Biopatch. Post procedure assessment: blood return through all ports, free fluid flow and no air  Patient tolerated the procedure well with no immediate complications.

## 2020-04-16 NOTE — Anesthesia Procedure Notes (Signed)
Procedure Name: Intubation Date/Time: 04/16/2020 1:49 PM Performed by: Raenette Rover, CRNA Pre-anesthesia Checklist: Patient identified, Emergency Drugs available, Suction available and Patient being monitored Patient Re-evaluated:Patient Re-evaluated prior to induction Oxygen Delivery Method: Circle system utilized Preoxygenation: Pre-oxygenation with 100% oxygen Induction Type: IV induction Ventilation: Mask ventilation without difficulty Laryngoscope Size: Mac and 3 Grade View: Grade I Tube type: Oral Tube size: 7.0 mm Number of attempts: 1 Airway Equipment and Method: Stylet Placement Confirmation: ETT inserted through vocal cords under direct vision,  positive ETCO2 and breath sounds checked- equal and bilateral Secured at: 21 cm Tube secured with: Tape Dental Injury: Teeth and Oropharynx as per pre-operative assessment

## 2020-04-16 NOTE — Op Note (Signed)
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   Use of intraoperative microscope for microdissection  SURGEON: Dr. Consuella Lose, MD  ASSISTANT: Ferne Reus, PA-C  ANESTHESIA: General Endotracheal  EBL: 100cc  SPECIMENS:  1. Right frontal meningioma 2. Parasaggital frontal meningioma  DRAINS: None  COMPLICATIONS: None immediate  CONDITION: Hemodynamically stable to PACU  HISTORY: [name] [name] is a 64 y.o. female with a history of right frontal meningioma who underwent resection approximately 7 years ago.  Initial postoperative scan demonstrated complete resection.  Unfortunately she was lost to follow-up until she read presented to the hospital with a focal left-sided seizure.  Follow-up imaging demonstrated large recurrent parasagittal meningioma with an apparently distinct more laterally based posterior frontal convexity meningioma.  There was underlying brain edema.  Given the large recurrent size, related seizure, and brain edema, resection was indicated.  The risks, benefits, and alternatives to surgery were reviewed in detail with the patient and her husband.  After all questions were answered informed consent was obtained and witnessed.  PROCEDURE IN DETAIL: The patient was brought to the operating room. After induction of general anesthesia, the patient was positioned on the operative table in the [place] head holder in the supine position. All pressure points were meticulously padded.  Utilizing the stereotactic preoperative MRI scan, surface markers were coregistered until a satisfactory accuracy was achieved.  The previous sigmoid skin incision was identified, and the stereotactic system was used to mark out a  laterally directed "T" limb allowing access to the more laterally based tumor.  After timeout was conducted, the incision was infiltrated with local anesthetic with epinephrine.  Incision was then made sharply and Bovie electrocautery was used to dissect through subcutaneous tissue and the galea was incised.  The periosteum was also incised, and the temporalis fascia and muscle was identified laterally and also incised.  Single piece myocutaneous flap was then elevated.  The 3 portions of the incision was then retracted using 0 Vicryl stitches.  The previous craniotomy was identified with the 3 bur hole covers securing the flap.  The previously placed screws were removed.  The bur hole covers were then removed.  Only very small bur holes were identified from the previous craniotomy.  The high-speed drill was therefore used to enlarge these bur holes and the underlying dura was identified.  The high-speed drill was then used to create a new craniotomy extending more anterior, posterior, and more laterally in order to allow access to both tumors from the same craniotomy flap.  As the flap was being elevated, the dura was noted to be densely adherent to the inner surface of bone, and I did see the meningioma also adherent to the dura.  I was able to dissect the dura away from the inner table of bone.  At this point hemostasis on the dural edges was secured with bipolar electrocautery.  The microscope was then draped sterilely and brought into the field, and the remainder of the tumor resection was done under the microscope using microdissection technique.  Attention was initially turned to the more lateral meningioma.  While there is a arachnoid plane around the superficial portion of the tumor, this arachnoid plane was completely lost around the periphery of the tumor a few centimeters below the pial surface.  Using a combination of bipolar electrocautery and  blunt dissectors, the tumor was dissected away from  the underlying white matter circumferentially.  Small tumor vessels arising from the underlying brain feeding the tumor were sequentially coagulated and divided.  Once this was done, the convexity meningioma was removed en bloc and sent for permanent pathology.  At this point hemostasis was secured on the resected cavity using a combination of bipolar electrocautery and morselized Gelfoam with thrombin.  The resection cavity was irrigated with a copious amount of normal saline irrigation and no further active bleeding was identified.  The resection cavity was again covered with a layer of morselized Gelfoam with thrombin and covered with cottonoids.  Attention was then turned to the parasagittal meningioma.  In a similar fashion, the tumor was dissected away from the medial portion of the frontal lobe using a combination of bipolar electrocautery and blunt dissection.  Similar to the more lateral tumor, there was no clear arachnoid plane around this tumor.  There was also a significant amount of fibrosis around the lateral margin of the tumor suggesting that this is the recurrent portion of tumor.  Once the lateral portion of the tumor was dissected away from the medial frontal lobe, attention was turned to the more medial portion of tumor.  Initially the bipolar electrocautery was used to coagulate and dissect the tumor away from the superficial dural surface.  As this was done, I noted the tumor to be clearly invading the superior sagittal sinus.  The sinus was clearly occluded, without any active venous bleeding seen.  At this point, a dural surface was identified which I assume to be the contralateral wall of the sinus and the superior portion of the falx.  I was then able to remove this parasagittal meningioma en bloc.  This was also sent as a separate specimen for permanent pathology.  At this point the medial resection cavity was inspected.  No further tumor was identified.  The exposed dural surfaces  including the undersurface of the dura and the walls of the sagittal sinus were coagulated with bipolar electrocautery.  Hemostasis was secured using a combination of bipolar electrocautery and morselized Gelfoam and thrombin.  The wound was then irrigated with a copious amount of normal saline irrigation.  Having resected a large portion of the dura, a large sheet of collagen onlay graft was placed.  The bone flap was then replaced and plated with titanium plates and screws.  Prior to placement of the bone flap, I used a high-speed drill to remove the inner table of bone.  The temporalis fascia was then approximated with interrupted 0 Vicryl stitches.  The galea was approximated with interrupted 0 and 3-0 Vicryl stitches and the skin was closed with staples.  Bacitracin ointment and sterile dressings were applied.  The patient was then removed from the [place] head holder, extubated, and taken to the postanesthesia care unit in stable hemodynamic condition.  At the end of the case all sponge, needle, instrument, and cottonoid counts were correct.

## 2020-04-17 ENCOUNTER — Inpatient Hospital Stay (HOSPITAL_COMMUNITY): Payer: BC Managed Care – PPO

## 2020-04-17 ENCOUNTER — Encounter: Payer: Self-pay | Admitting: *Deleted

## 2020-04-17 DIAGNOSIS — D496 Neoplasm of unspecified behavior of brain: Secondary | ICD-10-CM

## 2020-04-17 DIAGNOSIS — D72829 Elevated white blood cell count, unspecified: Secondary | ICD-10-CM

## 2020-04-17 DIAGNOSIS — R569 Unspecified convulsions: Secondary | ICD-10-CM

## 2020-04-17 DIAGNOSIS — E785 Hyperlipidemia, unspecified: Secondary | ICD-10-CM

## 2020-04-17 DIAGNOSIS — Z87891 Personal history of nicotine dependence: Secondary | ICD-10-CM

## 2020-04-17 DIAGNOSIS — I10 Essential (primary) hypertension: Secondary | ICD-10-CM

## 2020-04-17 DIAGNOSIS — D62 Acute posthemorrhagic anemia: Secondary | ICD-10-CM

## 2020-04-17 DIAGNOSIS — Z86018 Personal history of other benign neoplasm: Secondary | ICD-10-CM

## 2020-04-17 DIAGNOSIS — D72828 Other elevated white blood cell count: Secondary | ICD-10-CM

## 2020-04-17 LAB — BASIC METABOLIC PANEL
Anion gap: 9 (ref 5–15)
BUN: 19 mg/dL (ref 8–23)
CO2: 24 mmol/L (ref 22–32)
Calcium: 7.9 mg/dL — ABNORMAL LOW (ref 8.9–10.3)
Chloride: 106 mmol/L (ref 98–111)
Creatinine, Ser: 0.84 mg/dL (ref 0.44–1.00)
GFR calc Af Amer: >60 mL/min (ref >60)
GFR calc non Af Amer: >60 mL/min (ref >60)
Glucose, Bld: 183 mg/dL — ABNORMAL HIGH (ref 70–99)
Potassium: 4 mmol/L (ref 3.5–5.1)
Sodium: 139 mmol/L (ref 135–145)

## 2020-04-17 LAB — CBC
HCT: 34.9 % — ABNORMAL LOW (ref 36.0–46.0)
Hemoglobin: 11.3 g/dL — ABNORMAL LOW (ref 12.0–15.0)
MCH: 30.1 pg (ref 26.0–34.0)
MCHC: 32.4 g/dL (ref 30.0–36.0)
MCV: 92.8 fL (ref 80.0–100.0)
Platelets: 222 10*3/uL (ref 150–400)
RBC: 3.76 MIL/uL — ABNORMAL LOW (ref 3.87–5.11)
RDW: 12.5 % (ref 11.5–15.5)
WBC: 16 10*3/uL — ABNORMAL HIGH (ref 4.0–10.5)
nRBC: 0 % (ref 0.0–0.2)

## 2020-04-17 MED ORDER — THROMBIN 5000 UNITS EX SOLR: OROMUCOSAL | Status: DC | PRN

## 2020-04-17 MED ORDER — LEVETIRACETAM 500 MG PO TABS
1000.0000 mg | ORAL_TABLET | Freq: Two times a day (BID) | ORAL | Status: DC
  Administered 2020-04-17: 1000 mg via ORAL
  Filled 2020-04-17: qty 2

## 2020-04-17 MED ORDER — GADOBUTROL 1 MMOL/ML IV SOLN
8.0000 mL | Freq: Once | INTRAVENOUS | Status: AC | PRN
  Administered 2020-04-17: 8 mL via INTRAVENOUS

## 2020-04-17 MED ORDER — PHENYLEPHRINE HCL-NACL 10-0.9 MG/250ML-% IV SOLN
INTRAVENOUS | Status: AC
  Filled 2020-04-17: qty 500

## 2020-04-17 MED ORDER — LEVETIRACETAM IN NACL 1000 MG/100ML IV SOLN
1000.0000 mg | Freq: Once | INTRAVENOUS | Status: AC
  Administered 2020-04-17: 1000 mg via INTRAVENOUS
  Filled 2020-04-17: qty 100

## 2020-04-17 MED ORDER — LEVETIRACETAM 750 MG PO TABS
1500.0000 mg | ORAL_TABLET | Freq: Two times a day (BID) | ORAL | Status: DC
  Administered 2020-04-17 – 2020-04-21 (×8): 1500 mg via ORAL
  Filled 2020-04-17 (×8): qty 2

## 2020-04-17 MED ORDER — LEVETIRACETAM IN NACL 500 MG/100ML IV SOLN
500.0000 mg | Freq: Once | INTRAVENOUS | Status: AC
  Administered 2020-04-17: 500 mg via INTRAVENOUS
  Filled 2020-04-17: qty 100

## 2020-04-17 MED FILL — Thrombin For Soln 20000 Unit: CUTANEOUS | Qty: 1 | Status: AC

## 2020-04-17 NOTE — Consult Note (Signed)
NEURO HOSPITALIST CONSULT NOTE   Requesting physician: Dr. Kathyrn Sheriff  Reason for Consult: Intermittent focal motor seizures of the LUE.   History obtained from: Patient and Chart     HPI:                                                                                                                                          [name] [name] is an 64 y.o. female with a PMHx of stroke, TIA, HLD, headache, carotid artery occlusion and craniotomy by Dr. Kathyrn Sheriff in 2014 for right frontal meningioma resection, who presented to [facility] on 5/30 after having had a seizure witnessed by her husband on the night of 04/11/2020. She had spent the day at [facility] and had walked 9 miles prior to her seizure. Neither she nor her husband were able to clarify what the duration of the seizure was. Apparently, she was alert during the spell without LOC. She described the event as mostly affecting her eyes with inability to blink and some facial numbness. She has never had a seizure before - she reports taking dilantin for 3 months after her operation in 2014, with no antiepileptics since then. She has had slight left-sided weakness that has been unchanged since her surgery in 2014. Of note, she was lost to follow up after 2016.  After admission to [facility], she was started on Keppra for seizure precautions and Dexamethasone for mass effect.  MRI brain on 5/31 revealed recurrent meningioma along the right frontoparietal convexity and falx with some contralateral extension. Compression and possible invasion of the adjacent superior sagittal sinus, which remained patent, was noted. Mild underlying parenchymal edema and mass effect was present.  Dr. Vertell Limber of Neurosurgery admitted the patient, with the following assessment/plan: "New onset seizure with recurrent right frontal meningioma.  Admit, start keppra, decadron, MRI, observe in hospital, plan OR for tumor resection by Dr. Kathyrn Sheriff in near future."  On  6/3, stereotactic right frontal craniotomy for resection of meningiomas was undertaken.   Today, repeat MRI revealed postoperative changes of extensive resection of recurrent meningioma. Possible two small areas of residual tumor were noted, for which attention on follow-up was recommended   The patient has continued to exhibit recurrent twitching of her left hand, despite the meningioma resection and treatment with Keppra. Neurology was called to further evaluate.   Past Medical History:  Diagnosis Date  . Bruises easily   . Carotid artery occlusion   . Dizziness   . Headache(784.0)   . Hyperlipidemia    takes Atorvastatin daily  . PONV (postoperative nausea and vomiting)   . Stroke ([place])   . TIA (transient ischemic attack)     Past Surgical History:  Procedure Laterality Date  . ABDOMINAL HYSTERECTOMY  2002  . COLONOSCOPY    .  CRANIOTOMY Right 09/10/2013   Procedure: Craniotomy for tumor excision;  Surgeon: Consuella Lose, MD;  Location: [place] [facility];  Service: Neurosurgery;  Laterality: Right;  Craniotomy for meningioma with stealth  . DILATION AND CURETTAGE OF UTERUS  1980  . left hand surgery  2007 and 2008   plates and screws    Family History  Problem Relation Age of Onset  . Cancer Father        Prostate  . Deep vein thrombosis Father               Social History:  reports that she has quit smoking. She has never used smokeless tobacco. She reports that she does not drink alcohol or use drugs.  No Known Allergies  MEDICATIONS:                                                                                                                     Prior to Admission:  Medications Prior to Admission  Medication Sig Dispense Refill Last Dose  . acetaminophen (TYLENOL) 325 MG tablet Take 650 mg by mouth every 6 (six) hours as needed for moderate pain or headache.   Past Week at Unknown time  . aspirin 81 MG tablet Take 81 mg by mouth daily.   Past Week at Unknown time   . atorvastatin (LIPITOR) 20 MG tablet Take 20 mg by mouth daily.   Past Week at Unknown time  . Cholecalciferol (VITAMIN D3) 2000 UNITS TABS Take 1 tablet by mouth daily.   Past Week at Unknown time  . ibuprofen (ADVIL,MOTRIN) 200 MG tablet Take 200-400 mg by mouth every 6 (six) hours as needed for headache or moderate pain.   Past Week at Unknown time  . metoprolol succinate (TOPROL-XL) 25 MG 24 hr tablet Take 12.5 mg by mouth daily.   04/11/2020 at 0800  . OVER THE COUNTER MEDICATION Take 1 tablet by mouth daily. Vit B Stress Tablet   Past Week at Unknown time  . potassium chloride SA (K-DUR,KLOR-CON) 20 MEQ tablet Take 20 mEq by mouth every other day.   04/12/2020   Scheduled: . atorvastatin  40 mg Oral Daily  . Chlorhexidine Gluconate Cloth  6 each Topical Daily  . cholecalciferol  1,000 Units Oral Daily  . dexamethasone (DECADRON) injection  4 mg Intravenous Q6H  . levETIRAcetam  1,500 mg Oral BID  . metoprolol succinate  12.5 mg Oral Daily  . pantoprazole (PROTONIX) IV  40 mg Intravenous QHS  . potassium chloride SA  20 mEq Oral QODAY  . senna  1 tablet Oral BID  . sodium chloride flush  10-40 mL Intracatheter Q12H   Continuous: . sodium chloride 75 mL/hr at 04/17/20 1500  . levETIRAcetam      ROS:  As per HPI. Comprehensive ROS otherwise negative.     Blood pressure 121/64, pulse 83, temperature 98.7 F (37.1 C), temperature source Oral, resp. rate 12, height 5\' 3"  (1.6 m), weight 78 kg, SpO2 91 %.   General Examination:                                                                                                       Physical Exam  HEENT-  Dressing in place from right sided craniotomy. Lungs- Respirations unlabored Extremities- No edema  Neurological Examination Mental Status: Awake and alert. Fully oriented. Speech fluent with intact  comprehension and naming.  Cranial Nerves: II:  Visual fields intact bilaterally. No extinction to DSS. PERRL.   III,IV, VI: No ptosis. Right sided gaze preference. Able to cross midline to the left with some hesitancy. No nystagmus.  V,VII: Temp sensation equal bilaterally. Left facial droop.  VIII: Hearing intact to voice IX,X: Palate rises symmetrically XI: Unable to shrug shoulder on the left  XII: midline tongue extension Motor: RUE and RLE 5/5 LUE: 0/5 LLE 4-/5 HF and KE. 0/5 ADF/APF and toe extension/flexion Sensory: Temp and light touch intact to BUE and BLE without asymmetry. No extinction to DSS Deep Tendon Reflexes: 2+ right biceps and brachioradialis. 1+ left biceps and brachioradialis. 2+ right patellar and brisk, low amplitude 3+ left patellar. 1+ right achilles, 0 left achilles Plantars: Mute bilaterally  Cerebellar: No ataxia with FNF on the right. Unable to perform on the left.  Gait: Unable to assess.  Other: Short-duration focal motor seizure involving the digits of the left hand was noted, with intermittent, spasmodic flexor activity. No seizure activity involving the face, eyes or LLE.    Lab Results: Basic Metabolic Panel: Recent Labs  Lab 04/12/20 1716 04/13/20 0437 04/17/20 0315  NA 141 142 139  K 4.0 3.9 4.0  CL 107 109 106  CO2 22 24 24   GLUCOSE 139* 144* 183*  BUN 10 12 19   CREATININE 0.72 0.73 0.84  CALCIUM 9.6 9.2 7.9*    CBC: Recent Labs  Lab 04/12/20 1716 04/13/20 0437 04/17/20 0315  WBC 9.9 15.4* 16.0*  NEUTROABS 8.6*  --   --   HGB 13.7 12.6 11.3*  HCT 42.8 39.6 34.9*  MCV 93.7 93.8 92.8  PLT 259 245 222    Cardiac Enzymes: No results for input(s): CKTOTAL, CKMB, CKMBINDEX, TROPONINI in the last 168 hours.  Lipid Panel: No results for input(s): CHOL, TRIG, HDL, CHOLHDL, VLDL, LDLCALC in the last 168 hours.  Imaging: DG Chest 1 View  Result Date: 04/16/2020 CLINICAL DATA:  Central venous catheter placement EXAM: CHEST  1 VIEW  COMPARISON:  04/12/2020 FINDINGS: Single frontal view of the chest demonstrates a left internal jugular central venous catheter tip overlying superior vena cava. Cardiac silhouette is unremarkable. No airspace disease, effusion, or pneumothorax. No acute bony abnormalities. IMPRESSION: 1. No complications after left internal jugular catheter placement. 2. No acute intrathoracic process. Electronically Signed   By: Randa Ngo M.D.   On: 04/16/2020 19:15   MR BRAIN W WO CONTRAST  Result Date: 04/17/2020 CLINICAL DATA:  Recurrent meningioma post resection EXAM: MRI HEAD WITHOUT AND WITH CONTRAST TECHNIQUE: Multiplanar, multiecho pulse sequences of the brain and surrounding structures were obtained without and with intravenous contrast. CONTRAST:  60mL GADAVIST GADOBUTROL 1 MMOL/ML IV SOLN COMPARISON:  04/13/2020 FINDINGS: Brain: Postoperative changes of right vertex craniotomy for resection of underlying meningioma. There is a thin underlying extra-axial collection of air, fluid, and blood products. Extra-axial air is present along the anterior right frontal convexity. Reduced diffusion in the underlying right frontoparietal lobes likely reflects a combination postoperative contusion/ischemia and blood products, noting subarachnoid and also probable parenchymal susceptibility. There has been significant resection of the recurrent meningioma. There is possible small volume residual enhancing nodular soft tissue along the right aspect falx on series 18, image 46 measuring approximately 1.4 x 0.6 cm axially. Possible additional residual tumor within the superior sagittal sinus with adjacent minimal contralateral extension (series 18, image 50). There is mild edema. No significant mass effect. No hydrocephalus. Vascular: Major vessel flow voids at the skull base are preserved. Skull and upper cervical spine: Postoperative changes. Normal marrow signal is preserved. Sinuses/Orbits: Paranasal sinuses are aerated.  Orbits are unremarkable. Other: Sella is unremarkable.  Mastoid air cells are clear. IMPRESSION: Postoperative changes of extensive resection of recurrent meningioma. Possible two small areas of residual tumor detailed above for which attention on follow-up is recommended Electronically Signed   By: Macy Mis M.D.   On: 04/17/2020 08:28    Assessment: 64 year old female with focal motor seizures contralateral to decompressed hemisphere s/p meningioma resection on 6/3.   1. Exam reveals plegic LUE, paretic LLE and left facial droop, in addition to a transient spell of twitching of the digits of the patient's left hand, appearing most consistent with a focal motor seizure.  2. Prior history of stroke.   Recommendations: 1. EEG (ordered) 2. Increasing Keppra to 1500 mg IV BID. Also administering an IV supplemental bolus of 1000 mg x 1 now.    Electronically signed: Dr. Kerney Elbe 04/17/2020, 3:40 PM

## 2020-04-17 NOTE — Progress Notes (Signed)
Left wrist/fingers rhythmically twitching, increased with attempted movement.  Called Dr. Arnoldo Morale with neurosurgery, orders received for one time dose of 500mg  IV Keppra. Remainder of neuro exam is stable. Pt currently in MRI.

## 2020-04-17 NOTE — Consult Note (Signed)
Physical Medicine and Rehabilitation Consult Reason for Consult: Decreased functional mobility Referring Physician: [facility]  HPI: [name] [name] is a 64 y.o. right-handed female with history of hyperlipidemia, hypertension, history of tobacco use, craniotomy for tumor excision 09/10/2013.  History taken from chart review, husband, and patient.  Patient lives with spouse.  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  Two-level home bed and bath on main level 5 steps to entry.  Good family support.  She presented on 04/12/2020 with left hemiparesis and seizures.  Patient on no antiepileptic medication prior to admission.  MRI showed recurrent meningioma along the right frontal parietal convexity and falx with some contralateral extension.  Compression and possible invasion of the adjacent superior sagittal sinus which remained patent.  Patient underwent stereotactic right frontal craniotomy resection of meningioma 04/16/2020 per Dr. Kathyrn Sheriff.  Maintained on Keppra for seizure prophylaxis as well as Decadron protocol.  Therapy evaluations completed with recommendations of physical medicine rehab consult.  Review of Systems  Constitutional: Negative for chills and fever.  HENT: Negative for hearing loss.   Eyes: Negative for blurred vision and double vision.  Respiratory: Negative for cough and shortness of breath.   Cardiovascular: Negative for chest pain and palpitations.  Gastrointestinal: Positive for constipation. Negative for heartburn, nausea and vomiting.  Genitourinary: Negative for dysuria, flank pain and hematuria.  Musculoskeletal: Positive for myalgias.  Skin: Negative for rash.  Neurological: Positive for dizziness, focal weakness, seizures and headaches. Negative for sensory change and speech change.  All other systems reviewed and are negative.  Past Medical History:  Diagnosis Date  . Bruises easily   . Carotid artery occlusion   . Dizziness   . Headache(784.0)   .  Hyperlipidemia    takes Atorvastatin daily  . PONV (postoperative nausea and vomiting)   . Stroke ([place])   . TIA (transient ischemic attack)    Past Surgical History:  Procedure Laterality Date  . ABDOMINAL HYSTERECTOMY  2002  . COLONOSCOPY    . CRANIOTOMY Right 09/10/2013   Procedure: Craniotomy for tumor excision;  Surgeon: Consuella Lose, MD;  Location: [place] [facility];  Service: Neurosurgery;  Laterality: Right;  Craniotomy for meningioma with stealth  . DILATION AND CURETTAGE OF UTERUS  1980  . left hand surgery  2007 and 2008   plates and screws   Family History  Problem Relation Age of Onset  . Cancer Father        Prostate  . Deep vein thrombosis Father    Social History:  reports that she has quit smoking. She has never used smokeless tobacco. She reports that she does not drink alcohol or use drugs. Allergies: No Known Allergies Medications Prior to Admission  Medication Sig Dispense Refill  . acetaminophen (TYLENOL) 325 MG tablet Take 650 mg by mouth every 6 (six) hours as needed for moderate pain or headache.    Marland Kitchen aspirin 81 MG tablet Take 81 mg by mouth daily.    Marland Kitchen atorvastatin (LIPITOR) 20 MG tablet Take 20 mg by mouth daily.    . Cholecalciferol (VITAMIN D3) 2000 UNITS TABS Take 1 tablet by mouth daily.    Marland Kitchen ibuprofen (ADVIL,MOTRIN) 200 MG tablet Take 200-400 mg by mouth every 6 (six) hours as needed for headache or moderate pain.    . metoprolol succinate (TOPROL-XL) 25 MG 24 hr tablet Take 12.5 mg by mouth daily.    Marland Kitchen OVER THE COUNTER MEDICATION Take 1 tablet by mouth daily. Vit B Stress Tablet    .  potassium chloride SA (K-DUR,KLOR-CON) 20 MEQ tablet Take 20 mEq by mouth every other day.      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 Functional Status:  Mobility: Bed Mobility Overal bed mobility: Needs Assistance Bed Mobility: Supine to Sit Supine to sit: Min assist, HOB elevated(pt utilizing bed rail) Transfers Overall transfer level: Needs assistance Equipment used: 1 person hand held assist Transfers: Sit to/from Stand, Technical brewer Transfers Sit to Stand: [place] assist Stand pivot transfers: Min assist General transfer comment: PT provides Left knee block and BUE support, pt with mild L knee buckling with stand pivot, slow and deliberate steps with turning from bed to recliner Ambulation/Gait Ambulation/Gait assistance: (pt defers to next session)    ADL:    Cognition: Cognition Overall Cognitive Status: Impaired/Different from baseline Orientation Level: Oriented X4 Cognition Arousal/Alertness: Awake/alert Behavior During Therapy: WFL for tasks assessed/performed Overall Cognitive Status: Impaired/Different from baseline Area of Impairment: Problem solving Problem Solving: Slow processing  Blood pressure 121/67, pulse 97, temperature 98 F (36.7 C), temperature source Oral, resp. rate 17, height 5\' 3"  (1.6 m), weight 78 kg, SpO2 97 %. Physical Exam  Vitals reviewed. Constitutional: She appears well-developed and well-nourished.  HENT:  Craniotomy site is dressed Right frontal edema  Eyes: Right eye exhibits no discharge. Left eye exhibits no discharge.  Unable to move eyes past midline on left  Neck: No tracheal deviation present. No thyromegaly present.  Respiratory: Effort normal. No stridor. No respiratory distress.  GI: Soft. She exhibits no  distension.  Musculoskeletal:     Comments: No edema or tenderness in extremities  Neurological: She is alert.  Right gaze preference.   Provides her name age and date of birth.   Fair awareness of deficits. Motor: RUE/RLE: 5/5 proximal distal LUE: 0/5 proximal distal LLE: Hip flexion, knee extension 2+/5, dorsiflexion 0/5  Skin:  Right [place] site with dressing C/D/I  Psychiatric: She has a normal mood and affect. Her behavior is normal.    Results for orders placed or performed during the hospital encounter of 04/12/20 (from the past 24 hour(s))  SARS Coronavirus 2 by RT PCR (hospital order, performed in [facility] hospital lab) Nasopharyngeal Nasopharyngeal Swab     Status: None   Collection Time: 04/16/20  1:00 PM   Specimen: Nasopharyngeal Swab  Result Value Ref Range   SARS Coronavirus 2 NEGATIVE NEGATIVE  Basic metabolic panel     Status: Abnormal   Collection Time: 04/17/20  3:15 AM  Result Value Ref Range   Sodium 139 135 - 145 mmol/L   Potassium 4.0 3.5 - 5.1 mmol/L   Chloride 106 98 - 111 mmol/L   CO2 24 22 - 32 mmol/L   Glucose, Bld 183 (H) 70 - 99 mg/dL   BUN 19 8 - 23 mg/dL   Creatinine, Ser 0.84 0.44 - 1.00 mg/dL   Calcium 7.9 (L) 8.9 - 10.3 mg/dL   GFR calc non Af Amer >60 >60 mL/min   GFR calc Af Amer >60 >60 mL/min   Anion gap 9 5 -  15  CBC     Status: Abnormal   Collection Time: 04/17/20  3:15 AM  Result Value Ref Range   WBC 16.0 (H) 4.0 - 10.5 K/uL   RBC 3.76 (L) 3.87 - 5.11 MIL/uL   Hemoglobin 11.3 (L) 12.0 - 15.0 g/dL   HCT 34.9 (L) 36.0 - 46.0 %   MCV 92.8 80.0 - 100.0 fL   MCH 30.1 26.0 - 34.0 pg   MCHC 32.4 30.0 - 36.0 g/dL   RDW 12.5 11.5 - 15.5 %   Platelets 222 150 - 400 K/uL   nRBC 0.0 0.0 - 0.2 %   DG Chest 1 View  Result Date: 04/16/2020 CLINICAL DATA:  Central venous catheter placement EXAM: CHEST  1 VIEW COMPARISON:  04/12/2020 FINDINGS: Single frontal view of the chest demonstrates a left internal jugular central venous catheter  tip overlying superior vena cava. Cardiac silhouette is unremarkable. No airspace disease, effusion, or pneumothorax. No acute bony abnormalities. IMPRESSION: 1. No complications after left internal jugular catheter placement. 2. No acute intrathoracic process. Electronically Signed   By: Randa Ngo M.D.   On: 04/16/2020 19:15   MR BRAIN W WO CONTRAST  Result Date: 04/17/2020 CLINICAL DATA:  Recurrent meningioma post resection EXAM: MRI HEAD WITHOUT AND WITH CONTRAST TECHNIQUE: Multiplanar, multiecho pulse sequences of the brain and surrounding structures were obtained without and with intravenous contrast. CONTRAST:  57mL GADAVIST GADOBUTROL 1 MMOL/ML IV SOLN COMPARISON:  04/13/2020 FINDINGS: Brain: Postoperative changes of right vertex craniotomy for resection of underlying meningioma. There is a thin underlying extra-axial collection of air, fluid, and blood products. Extra-axial air is present along the anterior right frontal convexity. Reduced diffusion in the underlying right frontoparietal lobes likely reflects a combination postoperative contusion/ischemia and blood products, noting subarachnoid and also probable parenchymal susceptibility. There has been significant resection of the recurrent meningioma. There is possible small volume residual enhancing nodular soft tissue along the right aspect falx on series 18, image 46 measuring approximately 1.4 x 0.6 cm axially. Possible additional residual tumor within the superior sagittal sinus with adjacent minimal contralateral extension (series 18, image 50). There is mild edema. No significant mass effect. No hydrocephalus. Vascular: Major vessel flow voids at the skull base are preserved. Skull and upper cervical spine: Postoperative changes. Normal marrow signal is preserved. Sinuses/Orbits: Paranasal sinuses are aerated. Orbits are unremarkable. Other: Sella is unremarkable.  Mastoid air cells are clear. IMPRESSION: Postoperative changes of extensive  resection of recurrent meningioma. Possible two small areas of residual tumor detailed above for which attention on follow-up is recommended Electronically Signed   By: Macy Mis M.D.   On: 04/17/2020 08:28    Assessment/Plan: Diagnosis: Recurrent right frontal craniotomy status post resection Labs independently reviewed.  Records reviewed and summated above.  1. Does the need for close, 24 hr/day medical supervision in concert with the patient's rehab needs make it unreasonable for this patient to be served in a less intensive setting? Yes 2. Co-Morbidities requiring supervision/potential complications: hyperlipidemia, HTN (monitor and provide prns in accordance with increased physical exertion and pain), history of tobacco use, hx of craniotomy s/p tumor excision, seizures, steroid-induced leukocytosis, ABLA (repeat labs, consider transfusion if necessary to ensure appropriate perfusion for increased activity tolerance) 3. Due to safety, skin/wound care, medication administration, pain management and patient education, does the patient require 24 hr/day rehab nursing? Yes 4. Does the patient require coordinated care of a physician, rehab nurse, therapy disciplines of PT/OT to address physical and functional deficits  in the context of the above medical diagnosis(es)? Yes Addressing deficits in the following areas: balance, endurance, locomotion, strength, transferring, bathing, dressing, toileting and psychosocial support 5. Can the patient actively participate in an intensive therapy program of at least 3 hrs of therapy per day at least 5 days per week? Yes 6. The potential for patient to make measurable gains while on inpatient rehab is excellent 7. Anticipated functional outcomes upon discharge from inpatient rehab are supervision and min assist  with PT, supervision and min assist with OT, n/a with SLP. 8. Estimated rehab length of stay to reach the above functional goals is: 12 to 16  days. 9. Anticipated discharge destination: Home 10. Overall Rehab/Functional Prognosis: good  RECOMMENDATIONS: This patient's condition is appropriate for continued rehabilitative care in the following setting: Recommend CIR, however patient refusing stating she has 3 young children to take care of and that she was discharged after 3 days with rolling walker after last surgery with similar deficits.  Patient doing fairly well on day of evaluation and possible reach supervision level of functioning after a few days, however inform patient we would follow-up if this was not the case.  Patient has agreed to participate in recommended program. Potentially Note that insurance prior authorization may be required for reimbursement for recommended care.  Comment: Rehab Admissions Coordinator to follow up.  I have personally performed a face to face diagnostic evaluation, including, but not limited to relevant history and physical exam findings, of this patient and developed relevant assessment and plan.  Additionally, I have reviewed and concur with the physician assistant's documentation above.   Delice Lesch, MD, ABPMR Lavon Paganini Angiulli, PA-C 04/17/2020

## 2020-04-17 NOTE — Progress Notes (Signed)
PROGRESS NOTE    [name] [name]  JSE:[id number] DOB: [birth year] DOA: 04/12/2020 PCP: Curlene Labrum, MD    Brief Narrative:  64 year old female with history of meningioma that was surgically removed in 2014, left hemiparesis, hypertension and hyperlipidemia presented from home with episode of seizure and confusion.  Transferred from [place] with CT scan of the head showing 5.2 cm mass on the right frontal lobe. 6/3, meningioma resection.   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  Presented with recurrent seizure episode.  Transferred to [facility] for neurosurgery.Keppra for seizure prophylaxis.  Dexamethasone for mass-effect. Status post meningioma resection 6/3.  Remains in neuro ICU for close neuro checks. Patient has developed left hemiplegia after surgery. Continue to work with PT OT. Refer to inpatient rehab.  Hypertension: Stable.  Resumed home medications.  Hyperlipidemia: Stable.  On statin.  Continued.  Left hemiplegia: Upper extremity more than lower extremity.  Work with PT OT.   DVT prophylaxis: SCD Code Status: Full code Family Communication: Husband at the bedside. Disposition Plan: Status is: Inpatient.   Dispo: The patient is from: Home              Anticipated d/c is to: Acute inpatient rehab.              Anticipated d/c date is: > 3 days              Patient currently is not medically stable to d/c.  Immediate postop.  Will need inpatient rehab.    Consultants:   Neurosurgery  Procedures:   None  Antimicrobials:   None   Subjective: Patient seen and examined.  Remains in neuro ICU after surgery.  Frustrated due to not able to move left arm, buckling of left leg and tingling of the left arm.  Husband at the bedside.  Objective: Vitals:   04/17/20 1100 04/17/20 1200 04/17/20 1300 04/17/20 1400  BP: 122/68 121/67 119/65 133/62   Pulse: 85 97 79 94  Resp: 12 17 10 13   Temp:  98.7 F (37.1 C)    TempSrc:  Oral    SpO2: 93% 97% 94% 96%  Weight:      Height:        Intake/Output Summary (Last 24 hours) at 04/17/2020 1414 Last data filed at 04/17/2020 1400 Gross per 24 hour  Intake 3267.91 ml  Output 1675 ml  Net 1592.91 ml   Filed Weights   04/13/20 0000 04/16/20 2100  Weight: 78.7 kg 78 kg    Examination:  Physical Exam  Constitutional: She is oriented to person, place, and time and well-developed, well-nourished, and in no distress.  Postop surgical sutures intact.  Eyes: Pupils are equal, round, and reactive to light.  Cardiovascular: Regular rhythm.  Pulmonary/Chest: Breath sounds normal.  Abdominal: Bowel sounds are normal.  Neurological: She is oriented to person, place, and time.  Right gaze preference.  Left upper extremity distal muscle groups 2+/5.  Left lower extremity mostly distal group 3/5. Decreased superficial sensation left upper extremity.  Psychiatric: Affect and judgment normal.  Flat affect and anxious.      Data Reviewed: I have personally reviewed following labs and imaging studies  CBC: Recent Labs  Lab 04/12/20 1716 04/13/20 0437 04/17/20 0315  WBC 9.9 15.4* 16.0*  NEUTROABS 8.6*  --   --   HGB 13.7 12.6 11.3*  HCT 42.8 39.6  34.9*  MCV 93.7 93.8 92.8  PLT 259 245 660   Basic Metabolic Panel: Recent Labs  Lab 04/12/20 1716 04/13/20 0437 04/17/20 0315  NA 141 142 139  K 4.0 3.9 4.0  CL 107 109 106  CO2 22 24 24   GLUCOSE 139* 144* 183*  BUN 10 12 19   CREATININE 0.72 0.73 0.84  CALCIUM 9.6 9.2 7.9*   GFR: Estimated Creatinine Clearance: 69.5 mL/min (by C-G formula based on SCr of 0.84 mg/dL). Liver Function Tests: Recent Labs  Lab 04/12/20 1716 04/13/20 0437  AST 18 14*  ALT 19 15  ALKPHOS 82 70  BILITOT 0.7 0.5  PROT 7.3 6.4*  ALBUMIN 4.0 3.4*   No results for input(s): LIPASE, AMYLASE in the last 168 hours. No results for input(s): AMMONIA  in the last 168 hours. Coagulation Profile: Recent Labs  Lab 04/12/20 1716  INR 1.1   Cardiac Enzymes: No results for input(s): CKTOTAL, CKMB, CKMBINDEX, TROPONINI in the last 168 hours. BNP (last 3 results) No results for input(s): PROBNP in the last 8760 hours. HbA1C: No results for input(s): HGBA1C in the last 72 hours. CBG: No results for input(s): GLUCAP in the last 168 hours. Lipid Profile: No results for input(s): CHOL, HDL, LDLCALC, TRIG, CHOLHDL, LDLDIRECT in the last 72 hours. Thyroid Function Tests: No results for input(s): TSH, T4TOTAL, FREET4, T3FREE, THYROIDAB in the last 72 hours. Anemia Panel: No results for input(s): VITAMINB12, FOLATE, FERRITIN, TIBC, IRON, RETICCTPCT in the last 72 hours. Sepsis Labs: No results for input(s): PROCALCITON, LATICACIDVEN in the last 168 hours.  Recent Results (from the past 240 hour(s))  Surgical pcr screen     Status: None   Collection Time: 04/15/20  6:26 AM   Specimen: Nasal Mucosa; Nasal Swab  Result Value Ref Range Status   MRSA, PCR NEGATIVE NEGATIVE Final   Staphylococcus aureus NEGATIVE NEGATIVE Final    Comment: (NOTE) The Xpert SA Assay (FDA approved for NASAL specimens in patients 74 years of age and older), is one component of a comprehensive surveillance program. It is not intended to diagnose infection nor to guide or monitor treatment. Performed at [facility], [place] [address], [place], [place] [address]   SARS Coronavirus 2 by RT PCR (hospital order, performed in [facility] hospital lab) Nasopharyngeal Nasopharyngeal Swab     Status: None   Collection Time: 04/16/20  1:00 PM   Specimen: Nasopharyngeal Swab  Result Value Ref Range Status   SARS Coronavirus 2 NEGATIVE NEGATIVE Final    Comment: (NOTE) SARS-CoV-2 target nucleic acids are NOT DETECTED. The SARS-CoV-2 RNA is generally detectable in upper and lower respiratory specimens during the acute phase of infection. The lowest concentration of  SARS-CoV-2 viral copies this assay can detect is 250 copies / mL. A negative result does not preclude SARS-CoV-2 infection and should not be used as the sole basis for treatment or other patient management decisions.  A negative result may occur with improper specimen collection / handling, submission of specimen other than nasopharyngeal swab, presence of viral mutation(s) within the areas targeted by this assay, and inadequate number of viral copies (<250 copies / mL). A negative result must be combined with clinical observations, patient history, and epidemiological information. Fact Sheet for Patients:   StrictlyIdeas.no Fact Sheet for Healthcare Providers: BankingDealers.co.za This test is not yet approved or cleared  by the [place] FDA and has been authorized for detection and/or diagnosis of SARS-CoV-2 by FDA under an Emergency Use Authorization (  EUA).  This EUA will remain in effect (meaning this test can be used) for the duration of the COVID-19 declaration under Section 564(b)(1) of the Act, 21 U.S.C. section 360bbb-3(b)(1), unless the authorization is terminated or revoked sooner. Performed at [facility], [place] [address], [place], [place] [address]          Radiology Studies: DG Chest 1 View  Result Date: 04/16/2020 CLINICAL DATA:  Central venous catheter placement EXAM: CHEST  1 VIEW COMPARISON:  04/12/2020 FINDINGS: Single frontal view of the chest demonstrates a left internal jugular central venous catheter tip overlying superior vena cava. Cardiac silhouette is unremarkable. No airspace disease, effusion, or pneumothorax. No acute bony abnormalities. IMPRESSION: 1. No complications after left internal jugular catheter placement. 2. No acute intrathoracic process. Electronically Signed   By: Randa Ngo M.D.   On: 04/16/2020 19:15   MR BRAIN W WO CONTRAST  Result Date: 04/17/2020 CLINICAL DATA:  Recurrent  meningioma post resection EXAM: MRI HEAD WITHOUT AND WITH CONTRAST TECHNIQUE: Multiplanar, multiecho pulse sequences of the brain and surrounding structures were obtained without and with intravenous contrast. CONTRAST:  68mL GADAVIST GADOBUTROL 1 MMOL/ML IV SOLN COMPARISON:  04/13/2020 FINDINGS: Brain: Postoperative changes of right vertex craniotomy for resection of underlying meningioma. There is a thin underlying extra-axial collection of air, fluid, and blood products. Extra-axial air is present along the anterior right frontal convexity. Reduced diffusion in the underlying right frontoparietal lobes likely reflects a combination postoperative contusion/ischemia and blood products, noting subarachnoid and also probable parenchymal susceptibility. There has been significant resection of the recurrent meningioma. There is possible small volume residual enhancing nodular soft tissue along the right aspect falx on series 18, image 46 measuring approximately 1.4 x 0.6 cm axially. Possible additional residual tumor within the superior sagittal sinus with adjacent minimal contralateral extension (series 18, image 50). There is mild edema. No significant mass effect. No hydrocephalus. Vascular: Major vessel flow voids at the skull base are preserved. Skull and upper cervical spine: Postoperative changes. Normal marrow signal is preserved. Sinuses/Orbits: Paranasal sinuses are aerated. Orbits are unremarkable. Other: Sella is unremarkable.  Mastoid air cells are clear. IMPRESSION: Postoperative changes of extensive resection of recurrent meningioma. Possible two small areas of residual tumor detailed above for which attention on follow-up is recommended Electronically Signed   By: Macy Mis M.D.   On: 04/17/2020 08:28        Scheduled Meds: . atorvastatin  40 mg Oral Daily  . Chlorhexidine Gluconate Cloth  6 each Topical Daily  . cholecalciferol  1,000 Units Oral Daily  . dexamethasone (DECADRON)  injection  4 mg Intravenous Q6H  . levETIRAcetam  1,000 mg Oral BID  . metoprolol succinate  12.5 mg Oral Daily  . pantoprazole (PROTONIX) IV  40 mg Intravenous QHS  . potassium chloride SA  20 mEq Oral QODAY  . senna  1 tablet Oral BID  . sodium chloride flush  10-40 mL Intracatheter Q12H   Continuous Infusions: . sodium chloride 75 mL/hr at 04/17/20 1400     LOS: 5 days    Time spent: 25 minutes    Barb Merino, MD [facility] Pager [phone]

## 2020-04-17 NOTE — Progress Notes (Signed)
EEG complete - results pending 

## 2020-04-17 NOTE — Evaluation (Signed)
Occupational Therapy Evaluation Patient Details Name: [name] [name] MRN: [id number] DOB: Jan 07, 1956 Today's Date: 04/17/2020    History of Present Illness THis 64 y.o. female admitted with seizure and confusion.  PT with h/o meningioma s/p crani with resection 2014.  Head CT whowed extra-axial mass in the Rt frontal convexity with vosogenic edema.  consistent with recurrent meningioma.  She underwent craniotomy and resection of tumor 6/3.  PMH includes:  TIA, CVA, Dizziness, carotid artery occlusion.   Clinical Impression   Pt admitted with above. She demonstrates the below listed deficits and will benefit from continued OT to maximize safety and independence with BADLs.  Pt presents to OT with Lt hemiparesis, impaired balance, decreased activity tolerance, impaired cognition.  She currently requires set up assist - max A for ADLs and min A for functional transfers.  She lives with her spouse and their three young daughters.  She was fully independent PTA, including working full time as an Corporate treasurer.  Feel she would benefit from CIR level therapies at discharge to allow her to maximize safety and independence with ADLs  Will follow acutely.       Follow Up Recommendations  CIR;Supervision/Assistance - 24 hour    Equipment Recommendations  None recommended by OT    Recommendations for Other Services       Precautions / Restrictions Precautions Precautions: Fall Restrictions Weight Bearing Restrictions: No      Mobility Bed Mobility Overal bed mobility: Needs Assistance Bed Mobility: Supine to Sit     Supine to sit: Min assist;HOB elevated(pt utilizing bed rail)        Transfers Overall transfer level: Needs assistance Equipment used: 1 person hand held assist Transfers: Sit to/from [facility] Sit to Stand: Min assist Stand pivot transfers: Min assist       General transfer comment: assist to steady and verbal cues for sequencing     Balance Overall  balance assessment: Needs assistance Sitting-balance support: Single extremity supported;Feet supported Sitting balance-Leahy Scale: Fair Sitting balance - Comments: supervision - min guard assist for static sitting    Standing balance support: Single extremity supported;During functional activity Standing balance-Leahy Scale: Poor Standing balance comment: min A to maintain balance                            ADL either performed or assessed with clinical judgement   ADL Overall ADL's : Needs assistance/impaired Eating/Feeding: Set up;Supervision/ safety;Sitting   Grooming: Wash/dry hands;Wash/dry face;Oral care;Set up;Supervision/safety;Sitting   Upper Body Bathing: Moderate assistance;Sitting   Lower Body Bathing: Sit to/from stand;Moderate assistance   Upper Body Dressing : Moderate assistance;Sitting   Lower Body Dressing: Maximal assistance;Sit to/from stand   Toilet Transfer: Minimal assistance;Stand-pivot;BSC   Toileting- Clothing Manipulation and Hygiene: Moderate assistance;Sit to/from stand       Functional mobility during ADLs: Minimal assistance       Vision Baseline Vision/History: Wears glasses Wears Glasses: At all times Patient Visual Report: No change from baseline Vision Assessment?: Yes Eye Alignment: Within Functional Limits Ocular Range of Motion: Within Functional Limits Alignment/Gaze Preference: Within Defined Limits Tracking/Visual Pursuits: Other (comment) Visual Fields: No apparent deficits Additional Comments: Pt frequently looses fixation on the Lt during pursuits.  She was able to read small print without difficulty and was able to read the clock on the wall      Perception Perception Perception Tested?: Yes   Praxis Praxis Praxis tested?: Within functional limits  Pertinent Vitals/Pain Pain Assessment: Faces Faces Pain Scale: Hurts little more Pain Location: head Pain Descriptors / Indicators: Sore Pain  Intervention(s): Monitored during session     Hand Dominance Right   Extremity/Trunk Assessment Upper Extremity Assessment Upper Extremity Assessment: LUE deficits/detail LUE Deficits / Details: Lt UE in brunnstrom stage III  beginning stage IV.  She is able to begin to isolate shoulder flexion/extension.  She demonstrates gross grasp of Lt hand, with no release.  Pt frequently states "I can't" when asked to move Lt UE and instead attempts to lift it with her Rt UE  LUE Sensation: decreased light touch;decreased proprioception LUE Coordination: decreased fine motor(twitching of L fingers with attempts at gross and fine move)   Lower Extremity Assessment Lower Extremity Assessment: LLE deficits/detail LLE Deficits / Details: 3-/5 knee extension, 3/5 knee flexion, 1/5 PF, 0/5 DF, 3-/5 hip flexion   Cervical / Trunk Assessment Cervical / Trunk Assessment: Normal   Communication Communication Communication: No difficulties   Cognition Arousal/Alertness: Awake/alert Behavior During Therapy: WFL for tasks assessed/performed;Flat affect Overall Cognitive Status: Impaired/Different from baseline Area of Impairment: Problem solving;Attention;Following commands;Awareness                   Current Attention Level: Selective   Following Commands: Follows one step commands consistently;Follows multi-step commands inconsistently;Follows multi-step commands with increased time   Awareness: Emergent Problem Solving: Slow processing;Requires verbal cues     General Comments  Spouse present and very supportive    Exercises Exercises: Other exercises Other Exercises Other Exercises: Pt reports parathesias Lt UE - encouraged her to discuss with MD.  instructed her in sensory desensitization techniques    Shoulder Instructions      Home Living Family/patient expects to be discharged to:: Private residence Living Arrangements: Spouse/significant other;Children Available Help at  Discharge: Family;Available 24 hours/day Type of Home: House Home Access: Stairs to enter [employer] of Steps: 5 Entrance Stairs-Rails: Right;Left Home Layout: Two level;Able to live on main level with bedroom/bathroom     Bathroom Shower/Tub: Teacher, early years/pre: Standard     Home Equipment: None   Additional Comments: Pt has two adoptive daughters, and custody of her grand daughter ages 76,8, and 54.  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        OT Problem List: Decreased strength;Decreased range of motion;Decreased activity tolerance;Impaired balance (sitting and/or standing);Decreased coordination;Decreased cognition;Decreased safety awareness;Decreased knowledge of use of DME or AE;Impaired UE functional use      OT Treatment/Interventions: Self-care/ADL training;Neuromuscular education;DME and/or AE instruction;Manual therapy;Therapeutic activities;Splinting;Cognitive remediation/compensation;Patient/family education;Balance training    OT Goals(Current goals can be found in the care plan section) Acute Rehab OT Goals Patient Stated Goal: to go back to work and be able to care for her children  OT Goal Formulation: With patient/family Time For Goal Achievement: 05/01/20 Potential to Achieve Goals: Good ADL Goals Pt Will Perform Grooming: with min guard assist;standing Pt Will Perform Upper Body Bathing: with supervision;with set-up;sitting Pt Will Perform Lower Body Bathing: with min assist;sit to/from stand Pt Will Perform Upper Body Dressing: with set-up;with supervision;sitting Pt Will Perform Lower Body Dressing: with min assist;sit to/from stand Pt Will Transfer to Toilet: with min guard assist;ambulating;regular height toilet;bedside commode;grab bars Pt Will Perform Toileting -  Clothing Manipulation and hygiene: with min guard assist;sit  to/from stand Pt/caregiver will Perform Home Exercise Program: Increased ROM;Left upper extremity;With Supervision;With written HEP provided Additional ADL Goal #1: Pt will use Lt UE as a gross assist 50% of the time during ADLs  OT Frequency: Min 2X/week   Barriers to D/C:            Co-evaluation              AM-PAC OT "6 Clicks" Daily Activity     Outcome Measure Help from another person eating meals?: A Little Help from another person taking care of personal grooming?: A Little Help from another person toileting, which includes using toliet, bedpan, or urinal?: A Lot Help from another person bathing (including washing, rinsing, drying)?: A Lot Help from another person to put on and taking off regular upper body clothing?: A Lot Help from another person to put on and taking off regular lower body clothing?: A Lot 6 Click Score: 14   End of Session Equipment Utilized During Treatment: Gait belt Nurse Communication: Mobility status  Activity Tolerance: Patient tolerated treatment well Patient left: in chair;with call bell/phone within reach;with family/visitor present  OT Visit Diagnosis: Unsteadiness on feet (R26.81);Cognitive communication deficit (R41.841)                Time: 0600-4599 OT Time Calculation (min): 36 min Charges:  OT General Charges $OT Visit: 1 Visit OT Evaluation $OT Eval Moderate Complexity: 1 Mod OT Treatments $Therapeutic Activity: 8-22 mins  Nilsa Nutting., OTR/L Acute Rehabilitation Services Pager [phone] Office [phone]   Lucille Passy M 04/17/2020, 2:14 PM

## 2020-04-17 NOTE — Progress Notes (Signed)
Spoke with Judson Roch, RN who is aware of d/c central line order and will follow through.

## 2020-04-17 NOTE — Anesthesia Postprocedure Evaluation (Signed)
Anesthesia Post Note  Patient: [name] [name]  Procedure(s) Performed: STEREOTACTIC CRANIOTOMY FOR RESECTION OF MENINGIOMA (N/A ) APPLICATION OF CRANIAL NAVIGATION (N/A )     Patient location during evaluation: PACU Anesthesia Type: General Level of consciousness: sedated Pain management: pain level controlled Vital Signs Assessment: post-procedure vital signs reviewed and stable Respiratory status: spontaneous breathing, nonlabored ventilation, respiratory function stable and patient connected to nasal cannula oxygen Cardiovascular status: blood pressure returned to baseline and stable Postop Assessment: no apparent nausea or vomiting Anesthetic complications: no    Last Vitals:  Vitals:   04/17/20 0500 04/17/20 0600  BP: 123/71 113/66  Pulse: 97 95  Resp: 11 10  Temp:    SpO2: 94% 94%    Last Pain:  Vitals:   04/17/20 0457  TempSrc:   PainSc: Asleep                 Effie Berkshire

## 2020-04-17 NOTE — Progress Notes (Signed)
Inpatient Rehabilitation-Admissions Coordinator   CIR consult completed by PM&R MD, Dr. Delice Lesch (please see his consult note for details). As pt is post op day 1, AC will follow up with pt on Monday to see if she is more interested in CIR and if she still has inpatient rehab level needs.   Please call if questions.   Raechel Ache, OTR/L  Rehab Admissions Coordinator  [phone] 04/17/2020 4:07 PM

## 2020-04-17 NOTE — Progress Notes (Signed)
Attempted to reach Dr. Cheral Marker via Epic chat and phone call. Unsure of what he is wanting due to him placing a discontinue order but patient is not on LTM.

## 2020-04-17 NOTE — Evaluation (Addendum)
Physical Therapy Evaluation Patient Details Name: [name] [name] MRN: [id number] DOB: May 01, 1956 Today's Date: 04/17/2020   History of Present Illness  THis 64 y.o. female admitted with seizure and confusion.  PT with h/o meningioma s/p crani with resection 2014.  Head CT whowed extra-axial mass in the Rt frontal convexity with vosogenic edema.  consistent with recurrent meningioma.  She underwent craniotomy and resection of tumor 6/3.  PMH includes:  TIA, CVA, Dizziness, carotid artery occlusion.  Clinical Impression  Pt presents to PT with deficits in functional mobility, gait, balance, strength, power, endurance, sensation, coordination. Pt with significant L weakness at this time and also reporting LUE sensation impairments. Pt with L knee buckling during transfer at this time and will benefit from continued acute PT POC to improve functional mobility and to reduce falls risk. Pt also demonstrating some inattention to left arm during session, PT provides education on need for increased attention to positioning of this arm with mobility. PT currently recommends CIR at time of discharge as pt was independent and working prior to admission and demonstrates the potential to return to an independent or modI level.    Follow Up Recommendations CIR;Supervision/Assistance - 24 hour    Equipment Recommendations  Wheelchair (measurements PT);Wheelchair cushion (measurements PT)(TBD, if home today would benefit from [employer])    Recommendations for Other Services Rehab consult     Precautions / Restrictions Precautions Precautions: Fall Restrictions Weight Bearing Restrictions: No      Mobility  Bed Mobility Overal bed mobility: Needs Assistance Bed Mobility: Supine to Sit     Supine to sit: Min assist;HOB elevated(pt utilizing bed rail)        Transfers Overall transfer level: Needs assistance Equipment used: 1 person hand held assist Transfers: Sit to/from [employer] Sit to Stand: Min assist Stand pivot transfers: Min assist       General transfer comment: PT provides Left knee block and BUE support, pt with mild L knee buckling with stand pivot, slow and deliberate steps with turning from bed to recliner  Ambulation/Gait Ambulation/Gait assistance: (pt defers to next session)              Stairs            Wheelchair Mobility    Modified Rankin (Stroke Patients Only) Modified Rankin (Stroke Patients Only) Pre-Morbid Rankin Score: No significant disability Modified Rankin: Moderately severe disability     Balance Overall balance assessment: Needs assistance Sitting-balance support: Single extremity supported;Feet supported Sitting balance-Leahy Scale: Fair Sitting balance - Comments: minG at edge of bed   Standing balance support: Bilateral upper extremity supported Standing balance-Leahy Scale: Poor Standing balance comment: minA to maintain static standing balance                             Pertinent Vitals/Pain Pain Assessment: Faces Faces Pain Scale: Hurts little more Pain Location: head Pain Descriptors / Indicators: Sore Pain Intervention(s): Monitored during session    Home Living Family/patient expects to be discharged to:: Private residence Living Arrangements: Spouse/significant other;Children Available Help at Discharge: Family;Available 24 hours/day Type of Home: House Home Access: Stairs to enter Entrance Stairs-Rails: Psychiatric nurse of Steps: 5 Home Layout: Two level;Able to live on main level with bedroom/bathroom Home Equipment: None      Prior Function Level of Independence: Independent         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  Hand Dominance   Dominant Hand: Right    Extremity/Trunk Assessment   Upper Extremity Assessment Upper Extremity Assessment: LUE deficits/detail LUE Deficits / Details: LUE  grossly weak with 3-/5 shoulder flexion, abduction, elbow flexion/extension, 2-/5 grip strength LUE Sensation: decreased light touch LUE Coordination: decreased fine motor(twitching of L fingers with attempts at gross and fine move)    Lower Extremity Assessment Lower Extremity Assessment: LLE deficits/detail LLE Deficits / Details: 3-/5 knee extension, 3/5 knee flexion, 1/5 PF, 0/5 DF, 3-/5 hip flexion    Cervical / Trunk Assessment Cervical / Trunk Assessment: Normal  Communication   Communication: No difficulties  Cognition Arousal/Alertness: Awake/alert Behavior During Therapy: WFL for tasks assessed/performed Overall Cognitive Status: Impaired/Different from baseline Area of Impairment: Problem solving                             Problem Solving: Slow processing        General Comments General comments (skin integrity, edema, etc.): VSS on RA, PT provides heat packs for L trap as pt reports L shoulder and trap tightness and aching    Exercises     Assessment/Plan    PT Assessment Patient needs continued PT services  PT Problem List Decreased strength;Decreased activity tolerance;Decreased balance;Decreased mobility;Decreased coordination;Decreased cognition;Decreased knowledge of use of DME;Decreased safety awareness;Decreased knowledge of precautions;Impaired sensation       PT Treatment Interventions DME instruction;Gait training;Functional mobility training;Therapeutic activities;Therapeutic exercise;Balance training;Neuromuscular re-education;Patient/family education;Cognitive remediation    PT Goals (Current goals can be found in the Care Plan section)  Acute Rehab PT Goals Patient Stated Goal: To improve mobility and get back to independence PT Goal Formulation: With patient/family Time For Goal Achievement: 05/01/20 Potential to Achieve Goals: Good Additional Goals Additional Goal #1: Pt will maintain dynamic standing balance within 10 inches of  her base of support with unilateral UE support and supervision    Frequency Min 4X/week   Barriers to discharge        Co-evaluation               AM-PAC PT "6 Clicks" Mobility  Outcome Measure Help needed turning from your back to your side while in a flat bed without using bedrails?: A Little Help needed moving from lying on your back to sitting on the side of a flat bed without using bedrails?: A Little Help needed moving to and from a bed to a chair (including a wheelchair)?: A Little Help needed standing up from a chair using your arms (e.g., wheelchair or bedside chair)?: A Little Help needed to walk in hospital room?: A Lot Help needed climbing 3-5 steps with a railing? : Total 6 Click Score: 15    End of Session   Activity Tolerance: Patient tolerated treatment well Patient left: in chair;with call bell/phone within reach;with chair alarm set;with family/visitor present Nurse Communication: Mobility status PT Visit Diagnosis: Unsteadiness on feet (R26.81);Other abnormalities of gait and mobility (R26.89);Muscle weakness (generalized) (M62.81);Hemiplegia and hemiparesis;Other symptoms and signs involving the nervous system (R29.898) Hemiplegia - Right/Left: Left Hemiplegia - dominant/non-dominant: Non-dominant Hemiplegia - caused by: Other cerebrovascular disease    Time: 1028-1058 PT Time Calculation (min) (ACUTE ONLY): 30 min   Charges:   PT Evaluation $PT Eval Moderate Complexity: 1 Mod PT Treatments $Therapeutic Activity: 8-22 mins        Zenaida Niece, PT, DPT Acute Rehabilitation Pager: [phone]   Zenaida Niece 04/17/2020, 11:23 AM

## 2020-04-17 NOTE — Procedures (Signed)
Patient Name: [name] [name]  MRN: [id number]  Epilepsy Attending: Lora Havens  Referring Physician/Provider: Dr Kerney Elbe Date: 04/17/2020 Duration: 24.28 mins  Patient history: 64 year old female with history of meningioma that was surgically removed in 2014, left hemiparesis, hypertension and hyperlipidemia presented from home with episode of seizure and confusion. EEG to evaluate for seizure  Level of alertness: Awake  AEDs during EEG study: Keppra, versed  Technical aspects: This EEG study was done with scalp electrodes positioned according to the 10-20 International system of electrode placement. Electrical activity was acquired at a sampling rate of 500Hz  and reviewed with a high frequency filter of 70Hz  and a low frequency filter of 1Hz . EEG data were recorded continuously and digitally stored.   Description: No clear posterior dominant rhythm was seen. EEG showed continuous generalized low amplitude 2-3Hz  delta slowing admixed with 15 to 18 Hz activity with irregular morphology distributed symmetrically and diffusely.  Hyperventilation and photic stimulation were not performed.     ABNORMALITY -Excessive beta, generalized -Continuous slow, generalized  IMPRESSION: This study is suggestive of moderate diffuse encephalopathy, nonspecific etiology. The excessive beta activity seen in the background is most likely due to the effect of benzodiazepine and is a benign EEG pattern. No seizures or epileptiform discharges were seen throughout the recording.  [name]

## 2020-04-17 NOTE — Progress Notes (Signed)
Inpatient Rehab Admissions Coordinator Note:   Per PT recommendations, pt was screened for CIR candidacy by Shann Medal, PT, DPT.  At this time we are recommending an inpatient rehab consult.  I will place an order per our protocol.  Please contact me with questions.   Shann Medal, PT, DPT [phone] 04/17/20 12:12 PM

## 2020-04-18 ENCOUNTER — Encounter (HOSPITAL_COMMUNITY): Payer: Self-pay | Admitting: Internal Medicine

## 2020-04-18 MED ORDER — DEXAMETHASONE 4 MG PO TABS
4.0000 mg | ORAL_TABLET | Freq: Four times a day (QID) | ORAL | Status: DC
  Administered 2020-04-18 – 2020-04-19 (×4): 4 mg via ORAL
  Filled 2020-04-18 (×4): qty 1

## 2020-04-18 MED ORDER — PANTOPRAZOLE SODIUM 40 MG PO TBEC
40.0000 mg | DELAYED_RELEASE_TABLET | Freq: Every day | ORAL | Status: DC
  Administered 2020-04-18 – 2020-04-20 (×3): 40 mg via ORAL
  Filled 2020-04-18 (×3): qty 1

## 2020-04-18 NOTE — Progress Notes (Signed)
  NEUROSURGERY PROGRESS NOTE   Pt seen and examined. No issues overnight. Has noted "twitching" of primarily left hand. Has noted weakness of left hand and left foot.  EXAM: Temp:  [98 F (36.7 C)-98.7 F (37.1 C)] 98.3 F (36.8 C) (06/05 0400) Pulse Rate:  [70-155] 90 (06/05 0600) Resp:  [9-20] 15 (06/05 0600) BP: (98-156)/(59-77) 125/67 (06/05 0600) SpO2:  [91 %-99 %] 99 % (06/05 0600) Intake/Output      06/04 0701 - 06/05 0700   P.O. 720   I.V. (mL/kg) 1525.6 (19.6)   IV Piggyback 164.5   Total Intake(mL/kg) 2410 (30.9)   Urine (mL/kg/hr) 850 (0.5)   Total Output 850   Net +1560       Urine Occurrence 6 x   Stool Occurrence 1 x    Awake, alert, oriented Speech fluent CN intact, no obvious facial droop Good RUE/RLE strength Good strength proximal LUE/LLE, no voluntary movement left hand/wrist or ankle/toes Wound c/d/i  LABS: Lab Results  Component Value Date   CREATININE 0.84 04/17/2020   BUN 19 04/17/2020   NA 139 04/17/2020   K 4.0 04/17/2020   CL 106 04/17/2020   CO2 24 04/17/2020   Lab Results  Component Value Date   WBC 16.0 (H) 04/17/2020   HGB 11.3 (L) 04/17/2020   HCT 34.9 (L) 04/17/2020   MCV 92.8 04/17/2020   PLT 222 04/17/2020    IMAGING: MRI reviewed demonstrating near total resection of parasaggital and convexity right frontal meningiomas. Small amount of residual tumor seen within the SSS and falx.  IMPRESSION: - 65 y.o. female POD#1 s/p resection of recurrent meningioma. Has distal RUE/RLE weakness, possible focal motor sz of left arm  PLAN: - Cont keppra 1000mg  BID - Will consult neurology - Cont to monitor in ICU - Cont dexamethasone - PT/OT

## 2020-04-18 NOTE — Progress Notes (Signed)
Patient ID: [name] [name], female   DOB: [birth year], 64 y.o.   MRN: [id number] Looks good today.  She is awake and alert and pleasant and interactive and conversant.  Her incision looks good.  She is left hemiparetic especially distally in the arm.  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  She states this happened at the first resection and lasted about 2 weeks.  Continue to mobilize.  Overall I think she is doing fairly well.

## 2020-04-18 NOTE — Progress Notes (Signed)
Progress Note    [name] [name]  GGY:[id number] DOB: Feb 11, 1956  DOA: 04/12/2020 PCP: Curlene Labrum, MD      Brief Narrative:    Medical records reviewed and are as summarized below:  [name] [name] is an 64 y.o. female with a PMHx of stroke, TIA, HLD, headache, carotid artery occlusion and craniotomy by Dr. Kathyrn Sheriff in 2014 for right frontal meningioma resection, who presented to [facility] on 5/30 after having had a seizure witnessed by her husband on the night of 04/11/2020. She had spent the day at [facility] and had walked 9 miles prior to her seizure. She has never had a seizure before - she reports taking dilantin for 3 months after her operation in 2014, with no antiepileptics since then. She has had slight left-sided weakness that has been unchanged since her surgery in 2014.  She was admitted to neurosurgery service for new onset seizure with recurrent right frontal meningioma.  She was started on IV Keppra and IV dexamethasone for mass-effect, and she underwent stereotactic right frontal craniotomy for resection of meningioma.    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  Status post meningioma resection 6/3 2021.  Continue IV Keppra and IV dexamethasone.  Remains in neuro ICU for close neuro checks. Patient has developed left hemiparesis after surgery. Continue PT and OT. Refer to inpatient rehab.  Hypertension: Stable.  Continue antihypertensives  Hyperlipidemia: Stable.  On statin.  Continued.  Left hemiparesis: Upper extremity more than lower extremity.  Work with PT OT.     Body mass index is 30.46 kg/m.   Family Communication/Anticipated D/C date and plan/Code Status   DVT prophylaxis: SCDs Code Status:  Full code Family Communication: Plan discussed with patient Disposition Plan:    Status is: Inpatient  Remains inpatient appropriate because:IV treatments appropriate due to intensity of illness or inability to take PO and Inpatient level of care appropriate due to severity of illness   Dispo: The patient is from: Home              Anticipated d/c is to: CIR              Anticipated d/c date is: 3 days              Patient currently is not medically stable to d/c.           Subjective:   No new complaints.  She still has weakness in the left upper and lower extremities  Objective:    Vitals:   04/18/20 0600 04/18/20 0700 04/18/20 0800 04/18/20 0900  BP: 125/67 119/60 138/73 130/86  Pulse: 90 68 98   Resp: 15 13 15  (!) 24  Temp:   98.7 F (37.1 C)   TempSrc:   Axillary   SpO2: 99% 97% 98% 99%  Weight:      Height:       No data found.   Intake/Output Summary (Last 24 hours) at 04/18/2020 0948 Last data filed at 04/18/2020 0900 Gross per 24 hour  Intake 2184.01 ml  Output 575 ml  Net 1609.01 ml   Filed Weights   04/13/20 0000 04/16/20 2100  Weight: 78.7 kg 78 kg    Exam:  GEN:  NAD SKIN: No rash. Staples on scalp surgical site intact EYES: EOMI ENT: MMM CV: RRR PULM: CTA B ABD: soft, ND, NT, +BS CNS: AAO x 3, LUE 3/5, LLE 4/5 EXT: No edema or tenderness   Data Reviewed:   I have personally reviewed following labs and imaging studies:  Labs: Labs show the following:   Basic Metabolic Panel: Recent Labs  Lab 04/12/20 1716 04/12/20 1716 04/13/20 0437 04/17/20 0315  NA 141  --  142 139  K 4.0   < > 3.9 4.0  CL 107  --  109 106  CO2 22  --  24 24  GLUCOSE 139*  --  144* 183*  BUN 10  --  12 19  CREATININE 0.72  --  0.73 0.84  CALCIUM 9.6  --  9.2 7.9*   < > = values in this interval not displayed.   GFR Estimated Creatinine Clearance: 69.5 mL/min (by C-G formula based on SCr of 0.84 mg/dL). Liver Function Tests: Recent Labs  Lab  04/12/20 1716 04/13/20 0437  AST 18 14*  ALT 19 15  ALKPHOS 82 70  BILITOT 0.7 0.5  PROT 7.3 6.4*  ALBUMIN 4.0 3.4*   No results for input(s): LIPASE, AMYLASE in the last 168 hours. No results for input(s): AMMONIA in the last 168 hours. Coagulation profile Recent Labs  Lab 04/12/20 1716  INR 1.1    CBC: Recent Labs  Lab 04/12/20 1716 04/13/20 0437 04/17/20 0315  WBC 9.9 15.4* 16.0*  NEUTROABS 8.6*  --   --   HGB 13.7 12.6 11.3*  HCT 42.8 39.6 34.9*  MCV 93.7 93.8 92.8  PLT 259 245 222   Cardiac Enzymes: No results for input(s): CKTOTAL, CKMB, CKMBINDEX, TROPONINI in the last 168 hours. BNP (last 3 results) No results for input(s): PROBNP in the last 8760 hours. CBG: No results for input(s): GLUCAP in the last 168 hours. D-Dimer: No results for input(s): DDIMER in the last 72 hours. Hgb A1c: No results for input(s): HGBA1C in the last 72 hours. Lipid Profile: No results for input(s): CHOL, HDL, LDLCALC, TRIG, CHOLHDL, LDLDIRECT in the last 72 hours. Thyroid function studies: No results for input(s): TSH, T4TOTAL, T3FREE, THYROIDAB in the last 72 hours.  Invalid input(s): FREET3 Anemia work up: No results for input(s): VITAMINB12, FOLATE, FERRITIN, TIBC, IRON, RETICCTPCT in the last 72 hours. Sepsis Labs: Recent Labs  Lab 04/12/20 1716 04/13/20 0437 04/17/20 0315  WBC 9.9 15.4* 16.0*    Microbiology Recent Results (from the past 240 hour(s))  Surgical pcr screen     Status: None   Collection Time: 04/15/20  6:26 AM   Specimen: Nasal Mucosa; Nasal Swab  Result Value Ref Range Status   MRSA, PCR NEGATIVE NEGATIVE Final   Staphylococcus aureus NEGATIVE NEGATIVE Final    Comment: (NOTE) The Xpert SA Assay (FDA approved for NASAL specimens in patients 37 years of age and older), is one component of a comprehensive surveillance program. It is not intended to diagnose infection nor to guide or monitor treatment. Performed at [facility],  [place] [address], [place], [place] [address]   SARS Coronavirus 2 by RT PCR (hospital order, performed in [facility] hospital lab) Nasopharyngeal Nasopharyngeal Swab     Status: None   Collection Time: 04/16/20  1:00 PM   Specimen: Nasopharyngeal Swab  Result Value Ref Range Status   SARS Coronavirus 2 NEGATIVE NEGATIVE Final    Comment: (NOTE) SARS-CoV-2 target nucleic acids are NOT DETECTED.  The SARS-CoV-2 RNA is generally detectable in upper and lower respiratory specimens during the acute phase of infection. The lowest concentration of SARS-CoV-2 viral copies this assay can detect is 250 copies / mL. A negative result does not preclude SARS-CoV-2 infection and should not be used as the sole basis for treatment or other patient management decisions.  A negative result may occur with improper specimen collection / handling, submission of specimen other than nasopharyngeal swab, presence of viral mutation(s) within the areas targeted by this assay, and inadequate number of viral copies (<250 copies / mL). A negative result must be combined with clinical observations, patient history, and epidemiological information. Fact Sheet for Patients:   StrictlyIdeas.no Fact Sheet for Healthcare Providers: BankingDealers.co.za This test is not yet approved or cleared  by the [place] FDA and has been authorized for detection and/or diagnosis of SARS-CoV-2 by FDA under an Emergency Use Authorization (EUA).  This EUA will remain in effect (meaning this test can be used) for the duration of the COVID-19 declaration under Section 564(b)(1) of the Act, 21 U.S.C. section 360bbb-3(b)(1), unless the authorization is terminated or revoked sooner. Performed at [facility], [place] [address] [place], [place] [address]     Procedures and diagnostic studies:  DG Chest 1 View  Result Date: 04/16/2020 CLINICAL DATA:  Central venous catheter placement  EXAM: CHEST  1 VIEW COMPARISON:  04/12/2020 FINDINGS: Single frontal view of the chest demonstrates a left internal jugular central venous catheter tip overlying superior vena cava. Cardiac silhouette is unremarkable. No airspace disease, effusion, or pneumothorax. No acute bony abnormalities. IMPRESSION: 1. No complications after left internal jugular catheter placement. 2. No acute intrathoracic process. Electronically Signed   By: Randa Ngo M.D.   On: 04/16/2020 19:15   MR BRAIN W WO CONTRAST  Result Date: 04/17/2020 CLINICAL DATA:  Recurrent meningioma post resection EXAM: MRI HEAD WITHOUT AND WITH CONTRAST TECHNIQUE: Multiplanar, multiecho pulse sequences of the brain and surrounding structures were obtained without and with intravenous contrast. CONTRAST:  81mL GADAVIST GADOBUTROL 1 MMOL/ML IV SOLN COMPARISON:  04/13/2020 FINDINGS: Brain: Postoperative changes of right vertex craniotomy for resection of underlying meningioma. There is a thin underlying extra-axial collection of air, fluid, and blood products. Extra-axial air is present along the anterior right frontal convexity. Reduced diffusion in the underlying right frontoparietal lobes likely reflects a combination postoperative contusion/ischemia and blood products, noting subarachnoid and also probable parenchymal susceptibility. There has been significant resection of the recurrent meningioma. There is possible small volume residual enhancing nodular soft tissue along the right aspect falx on series 18, image 46 measuring approximately 1.4 x 0.6 cm axially. Possible additional residual tumor within the superior sagittal sinus with adjacent minimal contralateral extension (series 18, image 50). There is mild edema. No significant mass effect. No hydrocephalus. Vascular: Major vessel flow voids at the skull base are preserved. Skull and upper cervical spine: Postoperative changes. Normal marrow signal is preserved. Sinuses/Orbits: Paranasal  sinuses are aerated. Orbits are unremarkable. Other: Sella is unremarkable.  Mastoid air cells are clear. IMPRESSION: Postoperative changes of extensive resection of recurrent meningioma. Possible two small areas of residual tumor detailed above for which attention on follow-up is recommended Electronically Signed   By: Macy Mis M.D.   On: 04/17/2020 08:28   EEG adult  Result Date: 04/17/2020 Lora Havens, MD     04/17/2020  6:12 PM Patient Name: Roseana Rhine MRN: [id number] Epilepsy Attending: Lora Havens Referring Physician/Provider: Dr Kerney Elbe  Date: 04/17/2020 Duration: 24.28 mins Patient history: 64 year old female with history of meningioma that was surgically removed in 2014, left hemiparesis, hypertension and hyperlipidemia presented from home with episode of seizure and confusion. EEG to evaluate for seizure Level of alertness: Awake AEDs during EEG study: Keppra, versed Technical aspects: This EEG study was done with scalp electrodes positioned according to the 10-20 International system of electrode placement. Electrical activity was acquired at a sampling rate of 500Hz  and reviewed with a high frequency filter of 70Hz  and a low frequency filter of 1Hz . EEG data were recorded continuously and digitally stored. Description: No clear posterior dominant rhythm was seen. EEG showed continuous generalized low amplitude 2-3Hz  delta slowing admixed with 15 to 18 Hz activity with irregular morphology distributed symmetrically and diffusely.  Hyperventilation and photic stimulation were not performed.   ABNORMALITY -Excessive beta, generalized -Continuous slow, generalized IMPRESSION: This study is suggestive of moderate diffuse encephalopathy, nonspecific etiology. The excessive beta activity seen in the background is most likely due to the effect of benzodiazepine and is a benign EEG pattern. No seizures or epileptiform discharges were seen throughout the recording. Priyanka Barbra Sarks     Medications:    atorvastatin  40 mg Oral Daily   Chlorhexidine Gluconate Cloth  6 each Topical Daily   cholecalciferol  1,000 Units Oral Daily   dexamethasone (DECADRON) injection  4 mg Intravenous Q6H   levETIRAcetam  1,500 mg Oral BID   metoprolol succinate  12.5 mg Oral Daily   pantoprazole (PROTONIX) IV  40 mg Intravenous QHS   potassium chloride SA  20 mEq Oral QODAY   senna  1 tablet Oral BID   sodium chloride flush  10-40 mL Intracatheter Q12H   Continuous Infusions:  sodium chloride 75 mL/hr at 04/18/20 0900     LOS: 6 days   Gaddiel Cullens  [facility]     04/18/2020, 9:48 AM

## 2020-04-18 NOTE — Progress Notes (Signed)
Physical Therapy Treatment Patient Details Name: [name] [name] MRN: [id number] DOB: 1955-11-18 Today's Date: 04/18/2020    History of Present Illness THis 64 y.o. female admitted with seizure and confusion.  PT with h/o meningioma s/p crani with resection 2014.  Head CT whowed extra-axial mass in the Rt frontal convexity with vosogenic edema.  consistent with recurrent meningioma.  She underwent craniotomy and resection of tumor 6/3.  PMH includes:  TIA, CVA, Dizziness, carotid artery occlusion.    PT Comments    Pt tolerated treatment well and is enthusiastic about participating In PT POC to regain function. Pt continues to demonstrate significant L sided weakness, with poor knee control during WB on LLE in gait, and L foot drop decreasing foot clearance on that side. Pt does demonstrate improved attention to LUE during mobility, requesting a pillow to improve positioning of this extremity during sitting. Pt remains at a high falls risk at this time due to L sided weakness and will continue to benefit from PT POC to improve mobility quality and aide in a return to modI mobility. PT continues to recommend CIR at this time.   Follow Up Recommendations  CIR;Supervision/Assistance - 24 hour     Equipment Recommendations  Wheelchair (measurements PT);Wheelchair cushion (measurements PT);Other (comment)(hemiwalker if home today)    Recommendations for Other Services       Precautions / Restrictions Precautions Precautions: Fall Restrictions Weight Bearing Restrictions: No    Mobility  Bed Mobility Overal bed mobility: Needs Assistance Bed Mobility: Supine to Sit     Supine to sit: Min guard;HOB elevated     General bed mobility comments: pt utilizing RUE to assist LUE and LLE  Transfers Overall transfer level: Needs assistance Equipment used: Rolling walker (2 wheeled);1 person hand held assist Transfers: Sit to/from [facility] Sit to Stand: Min assist;Min  guard Stand pivot transfers: Mod assist;Min assist       General transfer comment: pt requires minA for initial sit to stands but progresses to minG with cues for foot and hand placement. Pt requires modA for stand pivot with use of RW, PT having to hold L hand on walker to maintain grip, minA for stand pivot with hand hold assist or use of bedrail/armrest  Ambulation/Gait Ambulation/Gait assistance: Max assist Gait Distance (Feet): 4 Feet(3' forward and back, 5' x2) Assistive device: 1 person hand held assist Gait Pattern/deviations: Step-to pattern;Decreased dorsiflexion - left;Decreased weight shift to left;Decreased weight shift to right Gait velocity: reduced Gait velocity interpretation: <1.31 ft/sec, indicative of household ambulator General Gait Details: pt requires assistance to weight shift toward R to clear L foot due to foot drop, pt also requires L knee block during stance phase to prevent buckling, intermittent hyperextension of L knee noted during stance phase   Stairs             Wheelchair Mobility    Modified Rankin (Stroke Patients Only) Modified Rankin (Stroke Patients Only) Pre-Morbid Rankin Score: No significant disability Modified Rankin: Moderately severe disability     Balance Overall balance assessment: Needs assistance Sitting-balance support: Single extremity supported;Feet unsupported Sitting balance-Leahy Scale: Good Sitting balance - Comments: supervision for static sitting   Standing balance support: Single extremity supported Standing balance-Leahy Scale: Poor Standing balance comment: minA for static standing with RUE support of RW or railing                            Cognition Arousal/Alertness: Awake/alert Behavior  During Therapy: WFL for tasks assessed/performed;Flat affect Overall Cognitive Status: Impaired/Different from baseline                             Awareness: Emergent Problem Solving: Slow  processing        Exercises General Exercises - Lower Extremity Ankle Circles/Pumps: AROM;Right;PROM;Left;10 reps Gluteal Sets: AROM;Both;5 reps Long Arc Quad: AROM;Both;5 reps Hip Flexion/Marching: AROM;Both;5 reps    General Comments General comments (skin integrity, edema, etc.): VSS on RA      Pertinent Vitals/Pain Pain Assessment: No/denies pain    Home Living                      Prior Function            PT Goals (current goals can now be found in the care plan section) Acute Rehab PT Goals Patient Stated Goal: to go back to work and be able to care for her children  Progress towards PT goals: Progressing toward goals    Frequency    Min 4X/week      PT Plan Current plan remains appropriate    Co-evaluation              AM-PAC PT "6 Clicks" Mobility   Outcome Measure  Help needed turning from your back to your side while in a flat bed without using bedrails?: A Little Help needed moving from lying on your back to sitting on the side of a flat bed without using bedrails?: A Little Help needed moving to and from a bed to a chair (including a wheelchair)?: A Little Help needed standing up from a chair using your arms (e.g., wheelchair or bedside chair)?: A Little Help needed to walk in hospital room?: Total Help needed climbing 3-5 steps with a railing? : Total 6 Click Score: 14    End of Session Equipment Utilized During Treatment: Gait belt Activity Tolerance: Patient tolerated treatment well Patient left: in chair;with call bell/phone within reach;with chair alarm set Nurse Communication: Mobility status PT Visit Diagnosis: Unsteadiness on feet (R26.81);Other abnormalities of gait and mobility (R26.89);Muscle weakness (generalized) (M62.81);Hemiplegia and hemiparesis;Other symptoms and signs involving the nervous system (R29.898) Hemiplegia - Right/Left: Left Hemiplegia - dominant/non-dominant: Non-dominant Hemiplegia - caused by:  Other cerebrovascular disease     Time: 6333-5456 PT Time Calculation (min) (ACUTE ONLY): 29 min  Charges:  $Gait Training: 8-22 mins $Therapeutic Activity: 8-22 mins                     Zenaida Niece, PT, DPT Acute Rehabilitation Pager: [phone]    Zenaida Niece 04/18/2020, 9:22 AM

## 2020-04-19 DIAGNOSIS — D72825 Bandemia: Secondary | ICD-10-CM

## 2020-04-19 MED ORDER — HEPARIN SODIUM (PORCINE) 5000 UNIT/ML IJ SOLN
5000.0000 [IU] | Freq: Three times a day (TID) | INTRAMUSCULAR | Status: DC
  Administered 2020-04-19 – 2020-04-21 (×5): 5000 [IU] via SUBCUTANEOUS
  Filled 2020-04-19 (×4): qty 1

## 2020-04-19 MED ORDER — DEXAMETHASONE 4 MG PO TABS
4.0000 mg | ORAL_TABLET | Freq: Every day | ORAL | Status: DC
  Administered 2020-04-20 – 2020-04-21 (×2): 4 mg via ORAL
  Filled 2020-04-19 (×2): qty 1

## 2020-04-19 NOTE — Progress Notes (Signed)
Neurosurgery Service Progress Note  Subjective: No acute events overnight, no new complaints   Objective: Vitals:   04/18/20 2048 04/18/20 2346 04/19/20 0407 04/19/20 0750  BP: (!) 107/58 127/69 (!) 116/59 125/72  Pulse: 84 89 80 76  Resp: 18 19 19 20   Temp: 98.1 F (36.7 C) 98 F (36.7 C) 98.4 F (36.9 C) 98.3 F (36.8 C)  TempSrc: Oral Oral Oral Oral  SpO2: 97% 97% 97% 98%  Weight:      Height:       Temp (24hrs), Avg:98.3 F (36.8 C), Min:98 F (36.7 C), Max:98.5 F (36.9 C)  CBC Latest Ref Rng & Units 04/17/2020 04/13/2020 04/12/2020  WBC 4.0 - 10.5 K/uL 16.0(H) 15.4(H) 9.9  Hemoglobin 12.0 - 15.0 g/dL 11.3(L) 12.6 13.7  Hematocrit 36.0 - 46.0 % 34.9(L) 39.6 42.8  Platelets 150 - 400 K/uL 222 245 259   BMP Latest Ref Rng & Units 04/17/2020 04/13/2020 04/12/2020  Glucose 70 - 99 mg/dL 183(H) 144(H) 139(H)  BUN 8 - 23 mg/dL 19 12 10   Creatinine 0.44 - 1.00 mg/dL 0.84 0.73 0.72  Sodium 135 - 145 mmol/L 139 142 141  Potassium 3.5 - 5.1 mmol/L 4.0 3.9 4.0  Chloride 98 - 111 mmol/L 106 109 107  CO2 22 - 32 mmol/L 24 24 22   Calcium 8.9 - 10.3 mg/dL 7.9(L) 9.2 9.6    Intake/Output Summary (Last 24 hours) at 04/19/2020 0956 Last data filed at 04/19/2020 0844 Gross per 24 hour  Intake 296.22 ml  Output 950 ml  Net -653.78 ml    Current Facility-Administered Medications:  .  acetaminophen (TYLENOL) tablet 650 mg, 650 mg, Oral, Q4H PRN, 650 mg at 04/18/20 1414 **OR** acetaminophen (TYLENOL) suppository 650 mg, 650 mg, Rectal, Q4H PRN, Costella, Vincent J, PA-C .  acetaminophen (TYLENOL) tablet 650 mg, 650 mg, Oral, Q6H PRN, Erline Levine, MD, 650 mg at 04/14/20 0941 .  atorvastatin (LIPITOR) tablet 40 mg, 40 mg, Oral, Daily, Erline Levine, MD, 40 mg at 04/18/20 0924 .  bisacodyl (DULCOLAX) EC tablet 5 mg, 5 mg, Oral, Daily PRN, Costella, Vista Mink, PA-C .  Chlorhexidine Gluconate Cloth 2 % PADS 6 each, 6 each, Topical, Daily, Barb Merino, MD, 6 each at 04/18/20 1122 .   cholecalciferol (VITAMIN D3) tablet 1,000 Units, 1,000 Units, Oral, Daily, Erline Levine, MD, 1,000 Units at 04/18/20 [phone] .  dexamethasone (DECADRON) tablet 4 mg, 4 mg, Oral, Q6H, Meyran, Ocie Cornfield, NP, 4 mg at 04/19/20 0651 .  hydrALAZINE (APRESOLINE) injection 5-20 mg, 5-20 mg, Intravenous, Q4H PRN, Costella, Vista Mink, PA-C .  HYDROcodone-acetaminophen (NORCO/VICODIN) 5-325 MG per tablet 1 tablet, 1 tablet, Oral, Q4H PRN, Costella, Vista Mink, PA-C, 1 tablet at 04/17/20 1158 .  HYDROmorphone (DILAUDID) injection 0.5-1 mg, 0.5-1 mg, Intravenous, Q2H PRN, Costella, Vincent J, PA-C, 0.5 mg at 04/17/20 0427 .  labetalol (NORMODYNE) injection 10-40 mg, 10-40 mg, Intravenous, Q10 min PRN, Costella, Vincent J, PA-C, 20 mg at 04/17/20 0728 .  levETIRAcetam (KEPPRA) tablet 1,500 mg, 1,500 mg, Oral, BID, Kerney Elbe, MD, 1,500 mg at 04/18/20 2144 .  metoprolol succinate (TOPROL-XL) 24 hr tablet 12.5 mg, 12.5 mg, Oral, Daily, Erline Levine, MD, 12.5 mg at 04/18/20 0924 .  naloxone [facility]) injection 0.08 mg, 0.08 mg, Intravenous, PRN, Costella, Vincent J, PA-C .  ondansetron (ZOFRAN) tablet 4 mg, 4 mg, Oral, Q4H PRN **OR** ondansetron (ZOFRAN) injection 4 mg, 4 mg, Intravenous, Q4H PRN, Costella, Vincent J, PA-C, 4 mg at 04/17/20 0427 .  oxyCODONE (Oxy  IR/ROXICODONE) immediate release tablet 5-10 mg, 5-10 mg, Oral, Q3H PRN, Costella, Vincent J, PA-C .  pantoprazole (PROTONIX) EC tablet 40 mg, 40 mg, Oral, Q supper, Alvira Philips, [place], 40 mg at 04/18/20 1830 .  polyethylene glycol (MIRALAX / GLYCOLAX) packet 17 g, 17 g, Oral, Daily PRN, Costella, Vincent J, PA-C .  potassium chloride SA (KLOR-CON) CR tablet 20 mEq, 20 mEq, Oral, Maxwell Caul, MD, 20 mEq at 04/18/20 0924 .  promethazine (PHENERGAN) tablet 12.5-25 mg, 12.5-25 mg, Oral, Q4H PRN, Costella, Vista Mink, PA-C .  senna (SENOKOT) tablet 8.6 mg, 1 tablet, Oral, BID, Costella, Vincent J, PA-C, 8.6 mg at 04/18/20 0924 .  sodium  chloride flush (NS) 0.9 % injection 10-40 mL, 10-40 mL, Intracatheter, Q12H, Ghimire, [place], MD, 10 mL at 04/18/20 2200 .  sodium chloride flush (NS) 0.9 % injection 10-40 mL, 10-40 mL, Intracatheter, PRN, Barb Merino, MD .  sodium phosphate (FLEET) 7-19 GM/118ML enema 1 enema, 1 enema, Rectal, Once PRN, Costella, Vista Mink, PA-C   Physical Exam: AOx3, strength 5/5 on L, RUE and RLE are 4/5 proximally, 1/5 distally with normal sensation Incision c/d/i  Assessment & Plan: 64 y.o. woman w/ seizures s/p resection of recurrent meningioma, recovering well, SMA versus primary motor weakness.  -CIR pending -wean dex to 4qd -start [place]  04/19/20 9:56 AM

## 2020-04-19 NOTE — Progress Notes (Signed)
Overnight, patient voided X 3 using bedpan. Pt denied pain. Pt reports she "can see better" this morning than before. Left arm strength is 3, and left leg strength is 3. Staples intact, with gauze dressing.

## 2020-04-19 NOTE — Progress Notes (Addendum)
NEURO HOSPITALIST PROGRESS NOTE   Subjective: Patient in bed awake, alert, enjoying church. No family at bedside. She states that she has barely noticed any twitching since the Keppra was increased.   Exam: Vitals:   04/19/20 0407 04/19/20 0750  BP: (!) 116/59 125/72  Pulse: 80 76  Resp: 19 20  Temp: 98.4 F (36.9 C) 98.3 F (36.8 C)  SpO2: 97% 98%    Physical Exam  Constitutional: Appears well-developed and well-nourished.  Psych: Affect appropriate to situation Eyes: Normal external eye and conjunctiva. HENT: Normocephalic, no lesions, without obvious abnormality.  Musculoskeletal-no joint tenderness, deformity or swelling Cardiovascular: Normal rate and regular rhythm.  Respiratory: Effort normal, non-labored breathing saturations WNL GI: Soft.  No distension. There is no tenderness.  Skin: WDI   Neuro:  Mental Status: Alert, oriented, thought content appropriate.  Speech fluent without evidence of aphasia.  Able to follow commands without difficulty. Cranial Nerves: II:  Visual fields grossly normal, PERRL III,IV, VI: no gaze preference noted today. No nystagmus. Ptosis not present. EOMI V: facial light touch sensation normal bilaterally VIII: hearing normal bilaterally IX,X: uvula rises symmetrically XI: bilateral shoulder shrug XII: midline tongue extension Motor: RUE and RLE 5/5 LUE: able to raise anti gravity, 3/5  LLE 4-/5 Sensory: Pinprick and light touch intact throughout, bilaterally Deep Tendon Reflexes: 2+ right biceps  1+ left biceps2+ right patellar  3+ left patellar.  Cerebellar: No ataxia with FNF on right. Unable to perform left side. Gait: deferred    Medications:  Scheduled: . atorvastatin  40 mg Oral Daily  . cholecalciferol  1,000 Units Oral Daily  . [START ON 04/20/2020] dexamethasone  4 mg Oral Daily  . heparin injection (subcutaneous)  5,000 Units Subcutaneous Q8H  . levETIRAcetam  1,500 mg Oral BID  . metoprolol  succinate  12.5 mg Oral Daily  . pantoprazole  40 mg Oral Q supper  . potassium chloride SA  20 mEq Oral QODAY  . senna  1 tablet Oral BID  . sodium chloride flush  10-40 mL Intracatheter Q12H   Continuous:  NTZ:[id number] **OR** acetaminophen, acetaminophen, bisacodyl, hydrALAZINE, HYDROcodone-acetaminophen, HYDROmorphone (DILAUDID) injection, labetalol, naLOXone (NARCAN)  injection, ondansetron **OR** ondansetron (ZOFRAN) IV, oxyCODONE, polyethylene glycol, promethazine, sodium chloride flush, sodium phosphate  Pertinent Labs/Diagnostics:   EEG adult  Result Date: 04/17/2020 Lora Havens, MD     04/17/2020  6:12 PM Patient Name: [name] [name] MRN: [id number] Epilepsy Attending: Lora Havens Referring Physician/Provider: Dr Kerney Elbe Date: 04/17/2020 Duration: 24.28 mins Patient history: 64 year old female with history of meningioma that was surgically removed in 2014, left hemiparesis, hypertension and hyperlipidemia presented from home with episode of seizure and confusion. EEG to evaluate for seizure Level of alertness: Awake AEDs during EEG study: Keppra, versed Technical aspects: This EEG study was done with scalp electrodes positioned according to the 10-20 International system of electrode placement. Electrical activity was acquired at a sampling rate of 500Hz  and reviewed with a high frequency filter of 70Hz  and a low frequency filter of 1Hz . EEG data were recorded continuously and digitally stored. Description: No clear posterior dominant rhythm was seen. EEG showed continuous generalized low amplitude 2-3Hz  delta slowing admixed with 15 to 18 Hz activity with irregular morphology distributed symmetrically and diffusely.  Hyperventilation and photic stimulation were not performed.   ABNORMALITY -Excessive beta, generalized -Continuous slow, generalized IMPRESSION: This study is suggestive  of moderate diffuse encephalopathy, nonspecific etiology. The excessive beta activity seen in  the background is most likely due to the effect of benzodiazepine and is a benign EEG pattern. No seizures or epileptiform discharges were seen throughout the recording. 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   1. 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 No left arm or hand twitching noted on today's exam.  2. Prior history of stroke.   Recommendations: 1. Continue Keppra at 1500 mg IV BID. 2. Neurology to sign off at this time, please call with any questions or concerns.  Laurey Morale, MSN, NP-C [facility] [phone]  Electronically signed: Dr. Kerney Elbe 04/19/2020, 11:47 AM

## 2020-04-19 NOTE — Progress Notes (Addendum)
Progress Note    [name] [name]  PIR:[id number] DOB: 04-10-1956  DOA: 04/12/2020 PCP: Curlene Labrum, MD      Brief Narrative:    Medical records reviewed and are as summarized below:  [name] [name] is an 64 y.o. female with a PMHx of stroke, TIA, HLD, headache, carotid artery occlusion and craniotomy by Dr. Kathyrn Sheriff in 2014 for right frontal meningioma resection, who presented to [facility] on 5/30 after having had a seizure witnessed by her husband on the night of 04/11/2020. She had spent the day at [facility] and had walked 9 miles prior to her seizure. She has never had a seizure before - she reports taking dilantin for 3 months after her operation in 2014, with no antiepileptics since then. She has had slight left-sided weakness that has been unchanged since her surgery in 2014.  She was admitted to neurosurgery service for new onset seizure with recurrent right frontal meningioma.  She was started on IV Keppra and IV dexamethasone for mass-effect, and she underwent stereotactic right frontal craniotomy for resection of meningioma.    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  Status post meningioma resection 6/3 2021.  Continue Keppra and dexamethasone.  Patient has developed left hemiparesis after surgery. Continue PT and OT. Refer to inpatient rehab.  Follow-up with neurosurgeon for further recommendations.  Hypertension: Stable.  Continue antihypertensives  Hyperlipidemia: Stable.  On statin.  Continued.  Left hemiparesis: Upper extremity more than lower extremity.  Work with PT and OT.  Leukocytosis: This is probably due to steroids.  Monitor CBC   Body mass index is 30.46 kg/m.   Family Communication/Anticipated  D/C date and plan/Code Status   DVT prophylaxis: SCDs Code Status: Full code Family Communication: Plan discussed with patient Disposition Plan:    Status is: Inpatient  Remains inpatient appropriate because:Unsafe d/c plan and Inpatient level of care appropriate due to severity of illness   Dispo: The patient is from: Home              Anticipated d/c is to: [facility]              Anticipated d/c date is: 3 days              Patient currently is not medically stable to d/c.           Subjective:   No new complaints.  She still has weakness in the left upper and lower extremities but she can move them better today.  Objective:    Vitals:   04/18/20 2346 04/19/20 0407 04/19/20 0750 04/19/20 1219  BP: 127/69 (!) 116/59 125/72 127/70  Pulse: 89 80 76 77  Resp: 19 19 20 16   Temp: 98 F (36.7 C) 98.4 F (36.9 C) 98.3 F (36.8 C) 98.5 F (36.9 C)  TempSrc: Oral Oral Oral Oral  SpO2: 97% 97% 98% 98%  Weight:      Height:       No data found.   Intake/Output Summary (Last 24 hours) at 04/19/2020 1350 Last data filed at 04/19/2020 0844 Gross per 24 hour  Intake 150 ml  Output 700 ml  Net -550 ml   Filed Weights   04/13/20 0000 04/16/20  2100  Weight: 78.7 kg 78 kg    Exam:  GEN: No acute distress SKIN: No rash. Staples on scalp surgical site intact EYES: No pallor or icterus ENT: MMM CV: RRR PULM: CTA B ABD: soft, ND, NT, +BS CNS: AAO x 3, LUE 3+/5, LLE 4/5 EXT: No edema or tenderness   Data Reviewed:   I have personally reviewed following labs and imaging studies:  Labs: Labs show the following:   Basic Metabolic Panel: Recent Labs  Lab 04/12/20 1716 04/12/20 1716 04/13/20 0437 04/17/20 0315  NA 141  --  142 139  K 4.0   < > 3.9 4.0  CL 107  --  109 106  CO2 22  --  24 24  GLUCOSE 139*  --  144* 183*  BUN 10  --  12 19  CREATININE 0.72  --  0.73 0.84  CALCIUM 9.6  --  9.2 7.9*   < > = values in this interval not displayed.    GFR Estimated Creatinine Clearance: 69.5 mL/min (by C-G formula based on SCr of 0.84 mg/dL). Liver Function Tests: Recent Labs  Lab 04/12/20 1716 04/13/20 0437  AST 18 14*  ALT 19 15  ALKPHOS 82 70  BILITOT 0.7 0.5  PROT 7.3 6.4*  ALBUMIN 4.0 3.4*   No results for input(s): LIPASE, AMYLASE in the last 168 hours. No results for input(s): AMMONIA in the last 168 hours. Coagulation profile Recent Labs  Lab 04/12/20 1716  INR 1.1    CBC: Recent Labs  Lab 04/12/20 1716 04/13/20 0437 04/17/20 0315  WBC 9.9 15.4* 16.0*  NEUTROABS 8.6*  --   --   HGB 13.7 12.6 11.3*  HCT 42.8 39.6 34.9*  MCV 93.7 93.8 92.8  PLT 259 245 222   Cardiac Enzymes: No results for input(s): CKTOTAL, CKMB, CKMBINDEX, TROPONINI in the last 168 hours. BNP (last 3 results) No results for input(s): PROBNP in the last 8760 hours. CBG: No results for input(s): GLUCAP in the last 168 hours. D-Dimer: No results for input(s): DDIMER in the last 72 hours. Hgb A1c: No results for input(s): HGBA1C in the last 72 hours. Lipid Profile: No results for input(s): CHOL, HDL, LDLCALC, TRIG, CHOLHDL, LDLDIRECT in the last 72 hours. Thyroid function studies: No results for input(s): TSH, T4TOTAL, T3FREE, THYROIDAB in the last 72 hours.  Invalid input(s): FREET3 Anemia work up: No results for input(s): VITAMINB12, FOLATE, FERRITIN, TIBC, IRON, RETICCTPCT in the last 72 hours. Sepsis Labs: Recent Labs  Lab 04/12/20 1716 04/13/20 0437 04/17/20 0315  WBC 9.9 15.4* 16.0*    Microbiology Recent Results (from the past 240 hour(s))  Surgical pcr screen     Status: None   Collection Time: 04/15/20  6:26 AM   Specimen: Nasal Mucosa; Nasal Swab  Result Value Ref Range Status   MRSA, PCR NEGATIVE NEGATIVE Final   Staphylococcus aureus NEGATIVE NEGATIVE Final    Comment: (NOTE) The Xpert SA Assay (FDA approved for NASAL specimens in patients 60 years of age and older), is one component of a  comprehensive surveillance program. It is not intended to diagnose infection nor to guide or monitor treatment. Performed at [facility], [place] [address] [place], [place] [address]   SARS Coronavirus 2 by RT PCR (hospital order, performed in [facility] hospital lab) Nasopharyngeal Nasopharyngeal Swab     Status: None   Collection Time: 04/16/20  1:00 PM   Specimen: Nasopharyngeal Swab  Result Value Ref Range Status  SARS Coronavirus 2 NEGATIVE NEGATIVE Final    Comment: (NOTE) SARS-CoV-2 target nucleic acids are NOT DETECTED. The SARS-CoV-2 RNA is generally detectable in upper and lower respiratory specimens during the acute phase of infection. The lowest concentration of SARS-CoV-2 viral copies this assay can detect is 250 copies / mL. A negative result does not preclude SARS-CoV-2 infection and should not be used as the sole basis for treatment or other patient management decisions.  A negative result may occur with improper specimen collection / handling, submission of specimen other than nasopharyngeal swab, presence of viral mutation(s) within the areas targeted by this assay, and inadequate number of viral copies (<250 copies / mL). A negative result must be combined with clinical observations, patient history, and epidemiological information. Fact Sheet for Patients:   StrictlyIdeas.no Fact Sheet for Healthcare Providers: BankingDealers.co.za This test is not yet approved or cleared  by the [place] FDA and has been authorized for detection and/or diagnosis of SARS-CoV-2 by FDA under an Emergency Use Authorization (EUA).  This EUA will remain in effect (meaning this test can be used) for the duration of the COVID-19 declaration under Section 564(b)(1) of the Act, 21 U.S.C. section 360bbb-3(b)(1), unless the authorization is terminated or revoked sooner. Performed at [facility], [place] [address],  [place], [place] [address]     Procedures and diagnostic studies:  EEG adult  Result Date: 04/17/2020 Lora Havens, MD     04/17/2020  6:12 PM Patient Name: [name] [name] MRN: [id number] Epilepsy Attending: Lora Havens Referring Physician/Provider: Dr Kerney Elbe Date: 04/17/2020 Duration: 24.28 mins Patient history: 64 year old female with history of meningioma that was surgically removed in 2014, left hemiparesis, hypertension and hyperlipidemia presented from home with episode of seizure and confusion. EEG to evaluate for seizure Level of alertness: Awake AEDs during EEG study: Keppra, versed Technical aspects: This EEG study was done with scalp electrodes positioned according to the 10-20 International system of electrode placement. Electrical activity was acquired at a sampling rate of 500Hz  and reviewed with a high frequency filter of 70Hz  and a low frequency filter of 1Hz . EEG data were recorded continuously and digitally stored. Description: No clear posterior dominant rhythm was seen. EEG showed continuous generalized low amplitude 2-3Hz  delta slowing admixed with 15 to 18 Hz activity with irregular morphology distributed symmetrically and diffusely.  Hyperventilation and photic stimulation were not performed.   ABNORMALITY -Excessive beta, generalized -Continuous slow, generalized IMPRESSION: This study is suggestive of moderate diffuse encephalopathy, nonspecific etiology. The excessive beta activity seen in the background is most likely due to the effect of benzodiazepine and is a benign EEG pattern. No seizures or epileptiform discharges were seen throughout the recording. Priyanka Barbra Sarks    Medications:   . atorvastatin  40 mg Oral Daily  . cholecalciferol  1,000 Units Oral Daily  . [START ON 04/20/2020] dexamethasone  4 mg Oral Daily  . heparin injection (subcutaneous)  5,000 Units Subcutaneous Q8H  . levETIRAcetam  1,500 mg Oral BID  . metoprolol succinate  12.5 mg Oral Daily  .  pantoprazole  40 mg Oral Q supper  . potassium chloride SA  20 mEq Oral QODAY  . senna  1 tablet Oral BID  . sodium chloride flush  10-40 mL Intracatheter Q12H   Continuous Infusions:    LOS: 7 days   Demarious Kapur  [facility]     04/19/2020, 1:50 PM

## 2020-04-20 LAB — CBC WITH DIFFERENTIAL/PLATELET
Abs Immature Granulocytes: 0.14 10*3/uL — ABNORMAL HIGH (ref 0.00–0.07)
Basophils Absolute: 0 10*3/uL (ref 0.0–0.1)
Basophils Relative: 0 %
Eosinophils Absolute: 0 10*3/uL (ref 0.0–0.5)
Eosinophils Relative: 0 %
HCT: 33.7 % — ABNORMAL LOW (ref 36.0–46.0)
Hemoglobin: 10.7 g/dL — ABNORMAL LOW (ref 12.0–15.0)
Immature Granulocytes: 1 %
Lymphocytes Relative: 36 %
Lymphs Abs: 4.5 10*3/uL — ABNORMAL HIGH (ref 0.7–4.0)
MCH: 29.8 pg (ref 26.0–34.0)
MCHC: 31.8 g/dL (ref 30.0–36.0)
MCV: 93.9 fL (ref 80.0–100.0)
Monocytes Absolute: 1.2 10*3/uL — ABNORMAL HIGH (ref 0.1–1.0)
Monocytes Relative: 10 %
Neutro Abs: 6.6 10*3/uL (ref 1.7–7.7)
Neutrophils Relative %: 53 %
Platelets: 227 10*3/uL (ref 150–400)
RBC: 3.59 MIL/uL — ABNORMAL LOW (ref 3.87–5.11)
RDW: 12.7 % (ref 11.5–15.5)
WBC: 12.5 10*3/uL — ABNORMAL HIGH (ref 4.0–10.5)
nRBC: 0.2 % (ref 0.0–0.2)

## 2020-04-20 LAB — SURGICAL PATHOLOGY

## 2020-04-20 NOTE — Progress Notes (Signed)
Physical Therapy Treatment Patient Details Name: [name] [name] MRN: [id number] DOB: [birth year] Today's Date: 04/20/2020    History of Present Illness THis 65 y.o. female admitted with seizure and confusion.  PT with h/o meningioma s/p crani with resection 2014.  Head CT whowed extra-axial mass in the Rt frontal convexity with vosogenic edema.  consistent with recurrent meningioma.  She underwent craniotomy and resection of tumor 6/3.  PMH includes:  TIA, CVA, Dizziness, carotid artery occlusion.    PT Comments    Pt in bed upon arrival of PT/OT, eager to participate in session with focus on progression of functional mobility and standing balance. The pt was able to demo improvements in initiation and independence with bed mobility, needing only minA to come to sitting EOB with minG for safety with balance once in sitting position. The pt was then able to complete multiple short bouts of walking in her room as well as static standing at sink to perform self-care tasks, all with assist of 2 people. The pt continues to present with limitations in functional strength, motor planning, coordination, and fluidity of movements that further impair her ability to progress her LLE while ambulating, as well as maintain static and dynamic stability in standing. The pt will continue to benefit from skilled PT to further progress functional mobility and LE strength to facilitate improved independence and safety following d/c. 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    Follow Up Recommendations  CIR     Equipment Recommendations  Wheelchair (measurements PT);Wheelchair cushion (measurements PT);Other (comment)    Recommendations for Other Services        Precautions / Restrictions Precautions Precautions: Fall Restrictions Weight Bearing Restrictions: No    Mobility  Bed Mobility Overal bed mobility: Needs Assistance Bed Mobility: Supine to Sit     Supine to sit: Min assist     General bed mobility comments: pt needing light MIN A +1 to transition EOB to pts L side; most assist for cues and sequencing  Transfers Overall transfer level: Needs assistance Equipment used: Rolling walker (2 wheeled)(pt would benefit from trial of hemiwalker, poor grip strength on LUE) Transfers: Sit to/from [facility] Sit to Stand: Min assist;+2 physical assistance;+2 safety/equipment Stand pivot transfers: Min assist;+2 safety/equipment       General transfer comment: pt required cues for hand placement and  MIN A +2 to initially power into standing from EOB; pt requires assist to stabilize LLE during mobility d/t  poor GM coordination in LLE during mobility needing assist to facilitate functional dorsiflexion and quad activation during gait progression; pt required assist to maintain LUE on RW during all mobility  Ambulation/Gait Ambulation/Gait assistance: Min assist;+2 physical assistance Gait Distance (Feet): 5 Feet(+ 8) Assistive device: Rolling walker (2 wheeled)(pt would benefit from hemiwalker) Gait Pattern/deviations: Step-to pattern;Decreased dorsiflexion - left;Decreased weight shift to left;Decreased weight shift to right Gait velocity: reduced   General Gait Details: pt contineus to require assist to facilitate full wt shfit to R to assist in clearance for LLE due to foot drop, able to perform hip flexion to clear toes. L knee block during stance to prevent buckling, but frequently demos hyperextension without support/cues to avoid knee "snapping back"   Stairs  Wheelchair Mobility    Modified Rankin (Stroke Patients Only) Modified Rankin (Stroke Patients Only) Pre-Morbid Rankin Score: No  significant disability Modified Rankin: Moderately severe disability     Balance Overall balance assessment: Needs assistance Sitting-balance support: Single extremity supported;Feet unsupported Sitting balance-Leahy Scale: Good Sitting balance - Comments: supervision for static sitting   Standing balance support: Single extremity supported Standing balance-Leahy Scale: Poor Standing balance comment: min A for dynamic tasks at sink                            Cognition Arousal/Alertness: Awake/alert Behavior During Therapy: WFL for tasks assessed/performed Overall Cognitive Status: Impaired/Different from baseline Area of Impairment: Problem solving;Awareness                       Following Commands: Follows one step commands consistently;Follows multi-step commands with increased time   Awareness: Intellectual Problem Solving: Slow processing General Comments: slightly impaired awareness noted in relation to functional deficits,      Exercises      General Comments General comments (skin integrity, edema, etc.): VSS on RA      Pertinent Vitals/Pain Pain Assessment: No/denies pain Pain Intervention(s): Limited activity within patient's tolerance;Monitored during session;Repositioned    Home Living                      Prior Function            PT Goals (current goals can now be found in the care plan section) Acute Rehab PT Goals Patient Stated Goal: to go back to work and be able to care for her children  PT Goal Formulation: With patient/family Time For Goal Achievement: 05/01/20 Potential to Achieve Goals: Good Additional Goals Additional Goal #1: Pt will maintain dynamic standing balance within 10 inches of her base of support with unilateral UE support and supervision Progress towards PT goals: Progressing toward goals    Frequency    Min 4X/week      PT Plan Current plan remains appropriate    Co-evaluation  PT/OT/SLP Co-Evaluation/Treatment: Yes Reason for Co-Treatment: For patient/therapist safety;To address functional/ADL transfers PT goals addressed during session: Mobility/safety with mobility;Balance;Proper use of DME OT goals addressed during session: ADL's and self-care      AM-PAC PT "6 Clicks" Mobility   Outcome Measure  Help needed turning from your back to your side while in a flat bed without using bedrails?: A Little Help needed moving from lying on your back to sitting on the side of a flat bed without using bedrails?: A Little Help needed moving to and from a bed to a chair (including a wheelchair)?: A Little Help needed standing up from a chair using your arms (e.g., wheelchair or bedside chair)?: A Little Help needed to walk in hospital room?: A Lot Help needed climbing 3-5 steps with a railing? : Total 6 Click Score: 15    End of Session Equipment Utilized During Treatment: Gait belt Activity Tolerance: Patient tolerated treatment well Patient left: in chair;with call bell/phone within reach;with chair alarm set Nurse Communication: Mobility status PT Visit Diagnosis: Unsteadiness on feet (R26.81);Other abnormalities of gait and mobility (R26.89);Muscle weakness (generalized) (M62.81);Hemiplegia and hemiparesis;Other symptoms and signs involving the nervous system (R29.898) Hemiplegia - Right/Left: Left Hemiplegia - dominant/non-dominant: Non-dominant Hemiplegia - caused by: Other cerebrovascular disease     Time: 0929-1005 PT Time Calculation (min) (ACUTE ONLY): 36 min  Charges:  $Gait  Training: 8-22 mins                     Karma Ganja, PT, DPT   Acute Rehabilitation Department Pager #: [phone]   Otho Bellows 04/20/2020, 2:06 PM

## 2020-04-20 NOTE — PMR Pre-admission (Signed)
PMR Admission Coordinator Pre-Admission Assessment  Patient: [name] [name] is an 64 y.o., female MRN: [id number] DOB: 1956-07-20 Height: '5\' 3"'  (160 cm) Weight: 78 kg              Insurance Information HMO:     PPO: yes     PCP:      IPA:      80/20:      OTHER:  PRIMARY: [name] [name]      Policy#: [id number]      Subscriber: patient CM Name: [name] [name]       Phone#: [phone]     Fax#: [phone] Pre-Cert#: [id number]      Employer:  [employer]auf[employer] provided by [name] [name] for admit to CIR 6/8. Pt is approved for 10 days (6/8-6/17). Follow up CM is [name] [name] (p): [phone] (f): [phone] Benefits:  Phone #: [phone]    Name: [name] [name] provider portal   Eff. Date: 11/15/19-11/13/20      Deduct: $4,000 ($41.53 met)      Out of Pocket Max: $6,500 (includes deductible - $190.63 met)      Life Max: NA  CIR: 70% coverage, 30% co-insurance  SNF: 70% coverage, 30% co-insurance; limited to 60 days per cal. yr Outpatient: 70% coverage, 30% co-insurance; limited to 30 visits per cal year per PT/OT/ST       Co-Pay:  Home Health: 70% coverage, 30% co-insurance; limited by medical necessity     DME: 70% coverage, 30% co-insurance  Providers:  SECONDARY: None      Policy#:       Phone#:   Development worker, community:       Phone#:   The Engineer, petroleum" for patients in Inpatient Rehabilitation Facilities with attached "Privacy Act [place] Records" was provided and verbally reviewed with: N/[name]  Emergency Contact Information Contact Information    Name Relation Home Work Mobile   [name] [name] Spouse [phone]  [phone]     Current Medical History  Patient Admitting Diagnosis: Recurrent right frontal craniotomy status post resection  History of Present Illness: [name] [name] is [name] 64 year old female with history of hyperlipidemia, hypertension, history of tobacco abuse, craniotomy for tumor excision/meningioma 09/10/2013.  Per chart review lives with husband.  Independent  working as an Corporate treasurer.  Two-level home bed and bath on main level 5 steps to entry and good family support.  Presented 04/12/2020 with left hemiparesis and seizure.  Patient on no antiepileptic medication prior to admission.  Admission chemistries unremarkable except glucose mildly elevated 139.  EEG identified no seizure.  MRI showed recurrent meningioma along the right frontoparietal convexity and falx with some contralateral extension.  Compression and possible invasion of the adjacent superior sagittal sinus which remain patent.  Patient underwent stereotactic right frontal craniotomy resection of meningioma 04/16/2020 per Dr. Kathyrn Sheriff.  Maintained on Keppra for seizure prophylaxis as well as Decadron protocol.  Subcutaneous heparin added for DVT prophylaxis 04/19/2020.  Patient is tolerating [name] regular diet.  Therapy evaluations completed and patient is to be admitted for [name] comprehensive rehab program on 04/21/20.   Glasgow Coma Scale Score: 15  Past Medical History  Past Medical History:  Diagnosis Date  . Bruises easily   . Carotid artery occlusion   . Dizziness   . Headache(784.0)   . Hyperlipidemia    takes Atorvastatin daily  . PONV (postoperative nausea and vomiting)   . Stroke ([place])   . TIA (transient ischemic attack)     Family History  family history includes Cancer in her father; Deep vein  thrombosis in her father.  Prior Rehab/Hospitalizations:  Has the patient had prior rehab or hospitalizations prior to admission? No  Has the patient had major surgery during 100 days prior to admission? Yes  Current Medications   Current Facility-Administered Medications:  .  acetaminophen (TYLENOL) tablet 650 mg, 650 mg, Oral, Q4H PRN, 650 mg at 04/20/20 2209 **OR** acetaminophen (TYLENOL) suppository 650 mg, 650 mg, Rectal, Q4H PRN, Costella, [name] J, PA-C .  acetaminophen (TYLENOL) tablet 650 mg, 650 mg, Oral, Q6H PRN, [name] Levine, MD, 650 mg at 04/14/20 0941 .  atorvastatin (LIPITOR)  tablet 40 mg, 40 mg, Oral, Daily, [name] Levine, MD, 40 mg at 04/21/20 0857 .  bisacodyl (DULCOLAX) EC tablet 5 mg, 5 mg, Oral, Daily PRN, Costella, [name] J, PA-C .  cholecalciferol (VITAMIN D3) tablet 1,000 Units, 1,000 Units, Oral, Daily, [name] Levine, MD, 1,000 Units at 04/21/20 0857 .  dexamethasone (DECADRON) tablet 4 mg, 4 mg, Oral, Daily, [name] [name], [name] A, MD, 4 mg at 04/21/20 0857 .  heparin injection 5,000 Units, 5,000 Units, Subcutaneous, Q8H, [name] [name], [name] Faster, MD, 5,000 Units at 04/21/20 0546 .  hydrALAZINE (APRESOLINE) injection 5-20 mg, 5-20 mg, Intravenous, Q4H PRN, Costella, Vista Mink, PA-C .  HYDROcodone-acetaminophen (NORCO/VICODIN) 5-325 MG per tablet 1 tablet, 1 tablet, Oral, Q4H PRN, Costella, Vista Mink, PA-C, 1 tablet at 04/17/20 1158 .  HYDROmorphone (DILAUDID) injection 0.5-1 mg, 0.5-1 mg, Intravenous, Q2H PRN, Costella, [name] J, PA-C, 0.5 mg at 04/17/20 0427 .  labetalol (NORMODYNE) injection 10-40 mg, 10-40 mg, Intravenous, Q10 min PRN, Costella, [name] J, PA-C, 20 mg at 04/17/20 0728 .  levETIRAcetam (KEPPRA) tablet 1,500 mg, 1,500 mg, Oral, BID, [name] Elbe, MD, 1,500 mg at 04/21/20 0858 .  metoprolol succinate (TOPROL-XL) 24 hr tablet 12.5 mg, 12.5 mg, Oral, Daily, [name] Levine, MD, 12.5 mg at 04/21/20 0857 .  naloxone [facility]) injection 0.08 mg, 0.08 mg, Intravenous, PRN, Costella, [name] J, PA-C .  ondansetron (ZOFRAN) tablet 4 mg, 4 mg, Oral, Q4H PRN **OR** ondansetron (ZOFRAN) injection 4 mg, 4 mg, Intravenous, Q4H PRN, Costella, [name] J, PA-C, 4 mg at 04/17/20 0427 .  oxyCODONE (Oxy IR/ROXICODONE) immediate release tablet 5-10 mg, 5-10 mg, Oral, Q3H PRN, Costella, [name] J, PA-C .  pantoprazole (PROTONIX) EC tablet 40 mg, 40 mg, Oral, Q supper, [name] [name], [place], 40 mg at 04/20/20 1900 .  polyethylene glycol (MIRALAX / GLYCOLAX) packet 17 g, 17 g, Oral, Daily PRN, Costella, [name] J, PA-C .  potassium chloride SA (KLOR-CON) CR tablet 20 mEq,  20 mEq, Oral, QODAY, Ghimire, Dante Gang, MD, 20 mEq at 04/20/20 1035 .  promethazine (PHENERGAN) tablet 12.5-25 mg, 12.5-25 mg, Oral, Q4H PRN, Costella, Vista Mink, PA-C .  senna (SENOKOT) tablet 8.6 mg, 1 tablet, Oral, BID, Costella, [name] J, PA-C, 8.6 mg at 04/20/20 2210 .  sodium chloride flush (NS) 0.9 % injection 10-40 mL, 10-40 mL, Intracatheter, Q12H, Barb Merino, MD, 10 mL at 04/21/20 0858 .  sodium chloride flush (NS) 0.9 % injection 10-40 mL, 10-40 mL, Intracatheter, PRN, Barb Merino, MD .  sodium phosphate (FLEET) 7-19 GM/118ML enema 1 enema, 1 enema, Rectal, Once PRN, Costella, Vista Mink, PA-C  Patients Current Diet:  Diet Order            Diet - low sodium heart healthy        Diet regular Room service appropriate? 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  Independent  Indoor Mobility: Did the patient need assistance with walking from room to room (with or without device)? Independent  Stairs: Did the patient need assistance with internal or external stairs (with or without device)? Independent  Functional Cognition: Did the patient need help planning regular tasks such as shopping or remembering to take medications? Independent  Home Assistive Devices / Equipment Home Assistive Devices/Equipment: Eyeglasses Home Equipment: None  Prior Device Use: Indicate devices/aids used by the patient prior to current illness,  exacerbation or injury? 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  She is able to begin to isolate shoulder flexion/extension.  She demonstrates gross grasp of Lt hand, with no release.  Pt frequently states "I can't" when asked to move Lt UE and instead attempts to lift it with her Rt UE  LUE Sensation: decreased light touch, decreased proprioception LUE Coordination: decreased fine motor(twitching of L fingers with attempts at gross and fine move)  Lower Extremity Assessment: LLE deficits/detail LLE Deficits / Details: 3-/5 knee extension, 3/5 knee flexion, 1/5 PF, 0/5 DF, 3-/5 hip flexion    ADLs  Overall ADL's : Needs assistance/impaired Eating/Feeding: Set up, Supervision/ safety, Sitting Grooming: Oral care, Standing, Supervision/safety, Set up, Minimal assistance, Cueing for sequencing, Cueing for safety, Cueing for compensatory techniques Grooming Details (indicate cue type and reason): MIN [name] for standing balance on R side; cues for compensatory techniques related to using LUE as stabilizer and hemi techniques Upper Body Bathing: Moderate assistance, Sitting Lower Body Bathing: Sit to/from stand, Moderate assistance Upper Body Dressing : Moderate assistance, Sitting Lower Body Dressing: Min guard, Sitting/lateral leans Lower Body Dressing Details (indicate cue type and reason): to adjust socks from EOB and recliner Toilet Transfer: Minimal assistance, +2 for  safety/equipment, +2 for physical assistance, Ambulation, RW Toilet Transfer Details (indicate cue type and reason): simulated via functional mobility with RW. pts LUE buckles during mobility needing +2 to stabilize on LLE. pt requries hand over hand assist maintain L hand on RW Toileting- Clothing Manipulation and Hygiene: Moderate assistance, Sit to/from stand Functional mobility during ADLs: Minimal assistance, +2 for safety/equipment, +2 for physical assistance, Rolling walker General ADL Comments: pt continues to present with decreased LUE coodination and FMC, generalized weakness, and impaired balance impacting pts ability to engage in BADLs. Session focus on functional mobility with RW and standing ADLs at sink    Mobility  Overal bed mobility: Needs Assistance Bed Mobility: Supine to Sit Supine to sit: Min assist General bed mobility comments: pt needing light MIN [name] +1 to transition EOB to pts L side; most assist for cues and sequencing    Transfers  Overall transfer level: Needs assistance Equipment used: Rolling walker (2 wheeled)(pt would benefit from trial of hemiwalker, poor grip strength on LUE) Transfers: Sit to/from Stand, [employer] Transfers Sit to Stand: Min assist, +2 physical assistance, +2 safety/equipment Stand pivot transfers: Min assist, +2 safety/equipment General transfer comment: pt required cues for hand placement and  MIN [name] +2 to initially power into standing from EOB; pt requires assist to stabilize LLE during mobility d/t  poor GM coordination in LLE during mobility needing assist to facilitate functional dorsiflexion and quad activation during gait progression; pt required assist to maintain LUE on RW during all mobility    Ambulation / Gait / Stairs / Wheelchair Mobility  Ambulation/Gait Ambulation/Gait assistance: Min assist, +2 physical assistance Gait Distance (Feet): 5 Feet(+ 8) Assistive device: Rolling walker (2 wheeled)(pt would benefit from  hemiwalker) Gait Pattern/deviations: Step-to pattern, Decreased dorsiflexion - left, Decreased weight shift to left, Decreased weight shift to right General Gait Details: pt contineus to require assist to facilitate full wt shfit to R to assist in clearance for LLE due to foot drop, able to perform hip flexion to clear toes. L knee block during stance to prevent buckling, but frequently demos hyperextension without support/cues to avoid knee "snapping back" Gait velocity: reduced Gait velocity interpretation: <1.31 ft/sec, indicative of household ambulator    Posture / Balance Dynamic Sitting Balance Sitting balance - Comments: supervision for static sitting Balance Overall balance assessment: Needs assistance Sitting-balance support: Single extremity supported, Feet unsupported Sitting balance-Leahy Scale: Good Sitting balance - Comments: supervision for static sitting Standing balance support: Single extremity supported Standing balance-Leahy Scale: Poor Standing balance comment: min [name] for dynamic tasks at sink    Special needs/care consideration Skin: ecchymosis to right and left arm, surgical incision to head    Designated visitor: husband Darnell Level) and daughter [name])     Previous Home Environment (from acute therapy documentation) Living Arrangements: Spouse/significant other, Children Available Help at Discharge: Family, Available 24 hours/day Type of Home: House Home Layout: Two level, Able to live on main level with bedroom/bathroom Home Access: Stairs to enter Entrance Stairs-Rails: Right, Left Entrance Stairs-Number of Steps: 5 Bathroom Shower/Tub: Chiropodist: Standard Home Care Services: No Additional Comments: Pt has two adoptive daughters, and custody of her grand daughter ages 68,8, and 50.  She works as an Corporate treasurer with disabled adults   Discharge Living Setting Plans for Discharge Living Setting: Patient's home, Lives with (comment)(husband and 3  young children (5,8,12)) Type of Home at Discharge: House Discharge Home Layout: Two level, Full bath on main level, Able to live on main level with bedroom/bathroom Alternate Level Stairs-Rails: None Alternate Level Stairs-Number of Steps: NA Discharge Home Access: Stairs to enter Entrance Stairs-Rails: None(plans to have them installed) Entrance Stairs-Number of Steps: 3-4 Discharge Bathroom Shower/Tub: Tub/shower unit Discharge Bathroom Toilet: Standard Discharge Bathroom Accessibility: Yes How Accessible: Accessible via walker Does the patient have any problems obtaining your medications?: No  Social/Family/Support Systems Patient Roles: Spouse, Parent, Other (Comment)(full time employee (LPN)) Contact Information: spouse Darnell Level): [phone]; sister Erskine Squibb): [phone]; daughter [name] [phone]) Anticipated Caregiver: Manuela Schwartz during the day; husabnd works 9-5:30 job.  Anticipated Caregiver's Contact Information: see above  Ability/Limitations of Caregiver: Min [name] Caregiver Availability: 24/7 Discharge Plan Discussed with Primary Caregiver: Yes(with pt, her husband, her sister, and her daughter) Is Caregiver In Agreement with Plan?: Yes Does Caregiver/Family have Issues with Lodging/Transportation while Pt is in Rehab?: No   Goals Patient/Family Goal for Rehab: PT/OT: Supervision/Min  [name]; SLP: NA Expected length of stay: 10-14 days Cultural Considerations: NA Pt/Family Agrees to Admission and willing to participate: Yes Program Orientation Provided & Reviewed with Pt/Caregiver Including Roles  & Responsibilities: Yes(pt, her sister, her daughter, and her husband )  Barriers to Discharge: Home environment access/layout  Barriers to Discharge Comments: steps to enter home   Decrease burden of Care through IP rehab admission: NA   Possible need for SNF placement upon discharge:Not anticipated; pt has good support at DC from her family and they are willing and able  to provide the anticipated Min [name] level as needed at DC. Pt is motivated to regain independence and return home quickly.    Patient Condition: This patient's medical and functional status has changed since the consult dated: 04/17/20 in which the Rehabilitation Physician determined and documented that the patient's condition is appropriate for intensive rehabilitative care in an inpatient rehabilitation facility. See "History of Present Illness" (above) for medical update. Functional changes are: decline from min [name] transfers to Min [name] +2, but progression in gait attempts from being deferred to Min [name] +2 for 5 feet. Patient's medical and functional status update has been discussed with the Rehabilitation physician and patient remains appropriate for inpatient rehabilitation. Will admit to inpatient rehab today.  Preadmission Screen Completed By:  Raechel Ache, OT, 04/21/2020 11:04 AM ______________________________________________________________________   Discussed status with Dr. Dagoberto Ligas on 04/21/20 at 11:02AM and received approval for admission today.  Admission Coordinator:  Raechel Ache, time 11:02AM/Date 04/21/20

## 2020-04-20 NOTE — TOC Initial Note (Signed)
Transition of Care [facility]) - Initial/Assessment Note    Patient Details  Name: [name] [name] MRN: [id number] Date of Birth: 1956/09/26  [facility]) CM/SW Contact:    Pollie Friar, RN Phone Number: 04/20/2020, 10:57 AM  Clinical Narrative:                 CM met with the patient this am and she is asking to attend CIR prior to home. She states she has thought about it and feels this would be the best option prior to returning home. CM has updated the MD and Claiborne Billings with CIR.  Pt states she will have transportation after d/c. She denies issues with home medications.  TOC following.  Expected Discharge Plan: IP Rehab Facility Barriers to Discharge: Continued Medical Work up   Patient Goals and CMS Choice   CMS Medicare.gov Compare Post Acute Care list provided to:: Patient Choice offered to / list presented to : Patient  Expected Discharge Plan and Services Expected Discharge Plan: [place]   Discharge Planning Services: CM Consult Post Acute Care Choice: IP Rehab Living arrangements for the past 2 months: Single Family Home                                      Prior Living Arrangements/Services Living arrangements for the past 2 months: Single Family Home Lives with:: Spouse Patient language and need for interpreter reviewed:: Yes Do you feel safe going back to the place where you live?: Yes      Need for Family Participation in Patient Care: Yes (Comment) Care giver support system in place?: Yes (comment)   Criminal Activity/Legal Involvement Pertinent to Current Situation/Hospitalization: No - Comment as needed  Activities of Daily Living Home Assistive Devices/Equipment: Eyeglasses ADL Screening (condition at time of admission) Patient's cognitive ability adequate to safely complete daily activities?: Yes Is the patient deaf or have difficulty hearing?: No Does the patient have difficulty seeing, even when wearing glasses/contacts?:  Yes Does the patient have difficulty concentrating, remembering, or making decisions?: No Patient able to express need for assistance with ADLs?: Yes Does the patient have difficulty dressing or bathing?: No Independently performs ADLs?: Yes (appropriate for developmental age) Does the patient have difficulty walking or climbing stairs?: No Weakness of Legs: None Weakness of Arms/Hands: None  Permission Sought/Granted                  Emotional Assessment Appearance:: Appears stated age Attitude/Demeanor/Rapport: Engaged Affect (typically observed): Accepting Orientation: : Oriented to Self, Oriented to Place, Oriented to  Time, Oriented to Situation   Psych Involvement: No (comment)  Admission diagnosis:  Seizure ([place]) [R56.9] Brain tumor [facility]) [D49.6] Patient Active Problem List   Diagnosis Date Noted  . Dyslipidemia   . Essential hypertension   . History of tobacco abuse   . History of meningioma   . Leucocytosis   . Acute blood loss anemia   . Brain tumor ([place]) 04/13/2020  . Seizure ([place]) 04/12/2020  . Occlusion and stenosis of carotid artery without mention of cerebral infarction 08/10/2012  . Fibromuscular dysplasia ([place]) 08/10/2012   PCP:  Curlene Labrum, MD Pharmacy:   [facility] [address] [place] [address] Phone: [phone] Fax: [place], [place] [address] [place] [address] Phone: [phone]  Fax: [place] [address] Phone: [phone] Fax: [phone]     Social Determinants of Health (SDOH) Interventions    Readmission Risk Interventions No flowsheet data found.

## 2020-04-20 NOTE — Progress Notes (Signed)
Overnight, patient voided and had BM. Tylenol given for mild "discomfort" and "stiffness" this morning. Strength in left arm increased from 3 to 4 this morning. Left leg strength is 3.

## 2020-04-20 NOTE — Progress Notes (Signed)
Inpatient Rehabilitation-Admissions Coordinator   Met with pt and her daughter [name] bedside as follow up from PM&R MD consult. Pt now interested in CIR program. We discussed program details, expected LOS, and anticipated functional level at DC. Pt would like to proceed with IP Rehab program. Discussed I will need to confirm DC support prior to [place] to contact pt's sister per her request for confirmation. AC will go ahead and begin insurance authorization process for possible admit. Will update once there has been a determination.   Raechel Ache, OTR/L  Rehab Admissions Coordinator  [phone] 04/20/2020 12:14 PM'

## 2020-04-20 NOTE — Progress Notes (Signed)
PROGRESS NOTE    [name] [name]  DGU:[id number] DOB: Apr 09, 1956 DOA: 04/12/2020 PCP: Curlene Labrum, MD    Brief Narrative:  [name] [name] is an 64 y.o. female with a PMHx of stroke, TIA, HLD, headache, carotid artery occlusion and craniotomy by Dr. Kathyrn Sheriff in 2014 for right frontal meningioma resection, who presented to [facility] on 5/30 after having had a seizurewitnessed by her husband on the night of 04/11/2020. [place] had [place] the [place] andhad[place]ed 9 miles prior to her seizure. She has never had a seizure before - she reports taking dilantin for 3 monthsafter her operation in 2014, withno antiepileptics since then. Shehas hadslight left-sided weakness that has been unchanged since her surgery in 2014.  She was admitted to neurosurgery service for new onset seizure with recurrent right frontal meningioma.  She was started on IV Keppra and IV dexamethasone for mass-effect, and she underwent stereotactic right frontal craniotomy for resection of meningioma.   Assessment & Plan:   Active Problems:   Seizure ([place])   Brain tumor ([place])   Dyslipidemia   Essential hypertension   History of tobacco abuse   History of meningioma   Leucocytosis   Acute blood loss anemia  Seizure in the setting of recurrent meningioma: History of meningioma resection 2014 Repeat meningioma resection 04/16/2020 Continue Keppra for seizure prophylaxis, on dexamethasone taper for intracranial swelling. Developed left hemiparesis after surgery, also developed left focal seizure after surgery.  On increasing dose of Keppra. Continue to work with PT OT.  Further management as per neurosurgery. Refer to inpatient rehab.  Hypertension: Blood pressure stable.  Continue antihypertensives.  Hyperlipidemia: Stable.  On a statin.  Left hand varices: Work with PT OT.  Probably due to motor cortex resection.  Leukocytosis: On steroids.  Improved.  No evidence of infection.  DVT prophylaxis: Heparin  subcu Code Status: Full code Family Communication: None Disposition Plan: Status is: Inpatient  Remains inpatient appropriate because:Inpatient level of care appropriate due to severity of illness   Dispo: The patient is from: Home              Anticipated d/c is to: CIR              Anticipated d/c date is: 1 day              Patient currently is medically stable to d/c.  Only to transfer to rehab level of care.         Consultants:   Neurosurgery  Neurology  Procedures:   Craniotomy  Antimicrobials:   None   Subjective: Patient seen and examined.  No overnight events.  Left hand and leg are weak, however no more has those annoying feeling of numbness and discomfort. Agreeable to work with PT OT and go to rehab.  Objective: Vitals:   04/20/20 0036 04/20/20 0413 04/20/20 0853 04/20/20 1129  BP: 110/67 109/63 112/63 122/60  Pulse: 76 70 79 (!) 104  Resp: 19 18 17 17   Temp: 98.4 F (36.9 C) 98.1 F (36.7 C) 98.5 F (36.9 C) 98.6 F (37 C)  TempSrc: Oral Oral Oral Oral  SpO2: 97% 96% 95% 98%  Weight:      Height:        Intake/Output Summary (Last 24 hours) at 04/20/2020 1353 Last data filed at 04/20/2020 0422 Gross per 24 hour  Intake --  Output 1100 ml  Net -1100 ml   Filed Weights   04/13/20 0000 04/16/20 2100  Weight: 78.7 kg 78  kg    Examination:  General exam: Appears calm and comfortable, working with therapy. Respiratory system: Clear to auscultation. Respiratory effort normal. Cardiovascular system: S1 & S2 heard, RRR. No JVD, murmurs, rubs, gallops or clicks. No pedal edema. Extremities: Left upper and lower extremity, 3/5. Skin: No rashes, lesions or ulcers Psychiatry: Judgement and insight appear normal. Mood & affect appropriate.     Data Reviewed: I have personally reviewed following labs and imaging studies  CBC: Recent Labs  Lab 04/17/20 0315 04/20/20 0326  WBC 16.0* 12.5*  NEUTROABS  --  6.6  HGB 11.3* 10.7*  HCT 34.9*  33.7*  MCV 92.8 93.9  PLT 222 035   Basic Metabolic Panel: Recent Labs  Lab 04/17/20 0315  NA 139  K 4.0  CL 106  CO2 24  GLUCOSE 183*  BUN 19  CREATININE 0.84  CALCIUM 7.9*   GFR: Estimated Creatinine Clearance: 69.5 mL/min (by C-G formula based on SCr of 0.84 mg/dL). Liver Function Tests: No results for input(s): AST, ALT, ALKPHOS, BILITOT, PROT, ALBUMIN in the last 168 hours. No results for input(s): LIPASE, AMYLASE in the last 168 hours. No results for input(s): AMMONIA in the last 168 hours. Coagulation Profile: No results for input(s): INR, PROTIME in the last 168 hours. Cardiac Enzymes: No results for input(s): CKTOTAL, CKMB, CKMBINDEX, TROPONINI in the last 168 hours. BNP (last 3 results) No results for input(s): PROBNP in the last 8760 hours. HbA1C: No results for input(s): HGBA1C in the last 72 hours. CBG: No results for input(s): GLUCAP in the last 168 hours. Lipid Profile: No results for input(s): CHOL, HDL, LDLCALC, TRIG, CHOLHDL, LDLDIRECT in the last 72 hours. Thyroid Function Tests: No results for input(s): TSH, T4TOTAL, FREET4, T3FREE, THYROIDAB in the last 72 hours. Anemia Panel: No results for input(s): VITAMINB12, FOLATE, FERRITIN, TIBC, IRON, RETICCTPCT in the last 72 hours. Sepsis Labs: No results for input(s): PROCALCITON, LATICACIDVEN in the last 168 hours.  Recent Results (from the past 240 hour(s))  Surgical pcr screen     Status: None   Collection Time: 04/15/20  6:26 AM   Specimen: Nasal Mucosa; Nasal Swab  Result Value Ref Range Status   MRSA, PCR NEGATIVE NEGATIVE Final   Staphylococcus aureus NEGATIVE NEGATIVE Final    Comment: (NOTE) The Xpert SA Assay (FDA approved for NASAL specimens in patients 52 years of age and older), is one component of a comprehensive surveillance program. It is not intended to diagnose infection nor to guide or monitor treatment. Performed at [facility], [place] [address], [place],  [place] [address]   SARS Coronavirus 2 by RT PCR (hospital order, performed in [facility] hospital lab) Nasopharyngeal Nasopharyngeal Swab     Status: None   Collection Time: 04/16/20  1:00 PM   Specimen: Nasopharyngeal Swab  Result Value Ref Range Status   SARS Coronavirus 2 NEGATIVE NEGATIVE Final    Comment: (NOTE) SARS-CoV-2 target nucleic acids are NOT DETECTED. The SARS-CoV-2 RNA is generally detectable in upper and lower respiratory specimens during the acute phase of infection. The lowest concentration of SARS-CoV-2 viral copies this assay can detect is 250 copies / mL. A negative result does not preclude SARS-CoV-2 infection and should not be used as the sole basis for treatment or other patient management decisions.  A negative result may occur with improper specimen collection / handling, submission of specimen other than nasopharyngeal swab, presence of viral mutation(s) within the areas targeted by this assay, and inadequate number of  viral copies (<250 copies / mL). A negative result must be combined with clinical observations, patient history, and epidemiological information. Fact Sheet for Patients:   StrictlyIdeas.no Fact Sheet for Healthcare Providers: BankingDealers.co.za This test is not yet approved or cleared  by the [place] FDA and has been authorized for detection and/or diagnosis of SARS-CoV-2 by FDA under an Emergency Use Authorization (EUA).  This EUA will remain in effect (meaning this test can be used) for the duration of the COVID-19 declaration under Section 564(b)(1) of the Act, 21 U.S.C. section 360bbb-3(b)(1), unless the authorization is terminated or revoked sooner. Performed at [facility], [place] [address], [place], [place] [address]          Radiology Studies: No results found.      Scheduled Meds: . atorvastatin  40 mg Oral Daily  . cholecalciferol  1,000 Units Oral Daily  .  dexamethasone  4 mg Oral Daily  . heparin injection (subcutaneous)  5,000 Units Subcutaneous Q8H  . levETIRAcetam  1,500 mg Oral BID  . metoprolol succinate  12.5 mg Oral Daily  . pantoprazole  40 mg Oral Q supper  . potassium chloride SA  20 mEq Oral QODAY  . senna  1 tablet Oral BID  . sodium chloride flush  10-40 mL Intracatheter Q12H   Continuous Infusions:   LOS: 8 days    Time spent: 25 minutes    Barb Merino, MD [facility] Pager [phone]

## 2020-04-20 NOTE — Progress Notes (Addendum)
Occupational Therapy Treatment Patient Details Name: [name] [name] MRN: [id number] DOB: December 09, 1955 Today's Date: 04/20/2020    History of present illness THis 64 y.o. female admitted with seizure and confusion.  PT with h/o meningioma s/p crani with resection 2014.  Head CT whowed extra-axial mass in the Rt frontal convexity with vosogenic edema.  consistent with recurrent meningioma.  She underwent craniotomy and resection of tumor 6/3.  PMH includes:  TIA, CVA, Dizziness, carotid artery occlusion.   OT comments  Pt seen in conjunction with PT to progress functional mobility and maximize functional independence. Overall, pt requires MIN A +2 with RW for functional mobility needing assist to maintain LUE grip on RW. Pt with poor GM coordination on LLE during mobility needing assist to facilitate functional dorsiflexion and quad activation during gait progression. Pt completed standing grooming tasks at sink with MIN A for standing balance and cues to utilize LUE as stabilizer during ADLs. Pt now agreeable to CIR placement, will follow acutely per POC.   Follow Up Recommendations  CIR;Supervision/Assistance - 24 hour    Equipment Recommendations  None recommended by OT    Recommendations for Other Services      Precautions / Restrictions Precautions Precautions: Fall Restrictions Weight Bearing Restrictions: No       Mobility Bed Mobility Overal bed mobility: Needs Assistance Bed Mobility: Supine to Sit     Supine to sit: Min assist     General bed mobility comments: pt needing light MIN A +1 to transition EOB to pts L side; most assist for cues and sequencing  Transfers Overall transfer level: Needs assistance Equipment used: Rolling walker (2 wheeled) Transfers: Sit to/from [facility] Sit to Stand: Min assist;+2 physical assistance;+2 safety/equipment Stand pivot transfers: Min assist;+2 safety/equipment       General transfer comment: pt required cues  for hand placement and  MIN A +2 to initially power into standing from EOB; pt requires assist to stabilize LLE during mobility d/t  poor GM coordination on LLE during mobility needing assist to facilitate functional dorsiflexion and quad activation during gait progression; pt required assist to maintain LUE on RW during all mobility    Balance Overall balance assessment: Needs assistance Sitting-balance support: Single extremity supported;Feet unsupported Sitting balance-Leahy Scale: Good     Standing balance support: Single extremity supported Standing balance-Leahy Scale: Poor Standing balance comment: min A for dynamic tasks at sink                           ADL either performed or assessed with clinical judgement   ADL Overall ADL's : Needs assistance/impaired     Grooming: Oral care;Standing;Supervision/safety;Set up;Minimal assistance;Cueing for sequencing;Cueing for safety;Cueing for compensatory techniques Grooming Details (indicate cue type and reason): MIN A for standing balance on R side; cues for compensatory techniques related to using LUE as stabilizer and hemi techniques             Lower Body Dressing: Min guard;Sitting/lateral leans Lower Body Dressing Details (indicate cue type and reason): to adjust socks from EOB and recliner Toilet Transfer: Minimal assistance;+2 for safety/equipment;+2 for physical assistance;Ambulation;RW Toilet Transfer Details (indicate cue type and reason): simulated via functional mobility with RW. pts LUE buckles during mobility needing +2 to stabilize on LLE. pt requries hand over hand assist maintain L hand on RW         Functional mobility during ADLs: Minimal assistance;+2 for safety/equipment;+2 for physical assistance;Rolling walker General ADL  Comments: pt continues to present with decreased LUE coodination and FMC, generalized weakness, and impaired balance impacting pts ability to engage in BADLs. Session focus on  functional mobility with RW and standing ADLs at sink     Vision Baseline Vision/History: Wears glasses Wears Glasses: At all times Patient Visual Report: No change from baseline;Other (comment)(slight L inattention noted?)     Perception     Praxis      Cognition Arousal/Alertness: Awake/alert Behavior During Therapy: WFL for tasks assessed/performed Overall Cognitive Status: Impaired/Different from baseline Area of Impairment: Problem solving;Awareness                           Awareness: Intellectual Problem Solving: Slow processing General Comments: slightly impaired awareness noted in relation to functional deficits.        Exercises     Shoulder Instructions       General Comments  Pt became tearful at appearance of head while standing at mirror.     Pertinent Vitals/ Pain       Pain Assessment: No/denies pain  Home Living                                          Prior Functioning/Environment              Frequency  Min 2X/week        Progress Toward Goals  OT Goals(current goals can now be found in the care plan section)  Progress towards OT goals: Progressing toward goals  Acute Rehab OT Goals Patient Stated Goal: to go back to work and be able to care for her children  OT Goal Formulation: With patient/family Time For Goal Achievement: 05/01/20 Potential to Achieve Goals: Good  Plan Discharge plan remains appropriate    Co-evaluation    PT/OT/SLP Co-Evaluation/Treatment: Yes Reason for Co-Treatment: For patient/therapist safety;To address functional/ADL transfers   OT goals addressed during session: ADL's and self-care      AM-PAC OT "6 Clicks" Daily Activity     Outcome Measure   Help from another person eating meals?: None Help from another person taking care of personal grooming?: A Little Help from another person toileting, which includes using toliet, bedpan, or urinal?: A Lot Help from another  person bathing (including washing, rinsing, drying)?: A Lot Help from another person to put on and taking off regular upper body clothing?: A Lot Help from another person to put on and taking off regular lower body clothing?: A Lot 6 Click Score: 15    End of Session Equipment Utilized During Treatment: Gait belt;Rolling walker  OT Visit Diagnosis: Unsteadiness on feet (R26.81);Cognitive communication deficit (R41.841)   Activity Tolerance Patient tolerated treatment well   Patient Left in chair;with call bell/phone within reach;with chair alarm set   Nurse Communication Mobility status        Time: 7414-2395 OT Time Calculation (min): 36 min  Charges: OT General Charges $OT Visit: 1 Visit OT Treatments $Self Care/Home Management : 8-22 mins  Lanier Clam., COTA/L Acute Rehabilitation Services [phone]    Ihor Gully 04/20/2020, 1:39 PM

## 2020-04-20 NOTE — Progress Notes (Addendum)
  NEUROSURGERY PROGRESS NOTE   No issues overnight. Reports "I had a good weekend" LUE focal motor seizures controlled No new N/T/W Not interested in CIR - has great family support at home. Would like outpatient therapy at d/c  EXAM:  BP 109/63 (BP Location: Right Arm)   Pulse 70   Temp 98.1 F (36.7 C) (Oral)   Resp 18   Ht 5\' 3"  (1.6 m)   Wt 78 kg   SpO2 96%   BMI 30.46 kg/m   Awake, alert, oriented  Speech fluent, appropriate  CN grossly intact  5/5 RUE/RLE Stable LUE/LLE weakness distal>proximal  IMPRESSION/PLAN 64 y.o. female POD#4 s/p resection of recurrent meningioma. Focal motor seizures now controlled with Keppra 1500mg  BID per neurology. Patient is not interested in CIR. She would like outpatient therapy. She is ready for discharge from a NS perspective. She will need an appointment in 2 weeks for staple removal. Would discharge home on a medrol dose pack. Please call for any concerns.

## 2020-04-21 ENCOUNTER — Inpatient Hospital Stay (HOSPITAL_COMMUNITY)
Admission: RE | Admit: 2020-04-21 | Discharge: 2020-05-07 | DRG: 949 | Disposition: A | Discharge status: Home / Self Care | Payer: BC Managed Care – PPO | Source: Intra-hospital | Attending: Physical Medicine & Rehabilitation | Admitting: Physical Medicine & Rehabilitation

## 2020-04-21 DIAGNOSIS — Z90710 Acquired absence of both cervix and uterus: Secondary | ICD-10-CM

## 2020-04-21 DIAGNOSIS — Z48811 Encounter for surgical aftercare following surgery on the nervous system: Secondary | ICD-10-CM | POA: Diagnosis present

## 2020-04-21 DIAGNOSIS — I959 Hypotension, unspecified: Secondary | ICD-10-CM | POA: Diagnosis not present

## 2020-04-21 DIAGNOSIS — Z716 Tobacco abuse counseling: Secondary | ICD-10-CM | POA: Diagnosis not present

## 2020-04-21 DIAGNOSIS — I10 Essential (primary) hypertension: Secondary | ICD-10-CM | POA: Diagnosis present

## 2020-04-21 DIAGNOSIS — R29810 Facial weakness: Secondary | ICD-10-CM | POA: Diagnosis present

## 2020-04-21 DIAGNOSIS — E785 Hyperlipidemia, unspecified: Secondary | ICD-10-CM | POA: Diagnosis present

## 2020-04-21 DIAGNOSIS — G40909 Epilepsy, unspecified, not intractable, without status epilepticus: Secondary | ICD-10-CM | POA: Diagnosis present

## 2020-04-21 DIAGNOSIS — Z79899 Other long term (current) drug therapy: Secondary | ICD-10-CM

## 2020-04-21 DIAGNOSIS — D320 Benign neoplasm of cerebral meninges: Secondary | ICD-10-CM | POA: Diagnosis present

## 2020-04-21 DIAGNOSIS — Z8042 Family history of malignant neoplasm of prostate: Secondary | ICD-10-CM | POA: Diagnosis not present

## 2020-04-21 DIAGNOSIS — Z86018 Personal history of other benign neoplasm: Secondary | ICD-10-CM

## 2020-04-21 DIAGNOSIS — R000 Tachycardia, unspecified: Secondary | ICD-10-CM | POA: Diagnosis not present

## 2020-04-21 DIAGNOSIS — Z86011 Personal history of benign neoplasm of the brain: Secondary | ICD-10-CM | POA: Diagnosis not present

## 2020-04-21 DIAGNOSIS — T380X5A Adverse effect of glucocorticoids and synthetic analogues, initial encounter: Secondary | ICD-10-CM | POA: Diagnosis not present

## 2020-04-21 DIAGNOSIS — Y92239 Unspecified place in hospital as the place of occurrence of the external cause: Secondary | ICD-10-CM | POA: Diagnosis not present

## 2020-04-21 DIAGNOSIS — D329 Benign neoplasm of meninges, unspecified: Secondary | ICD-10-CM | POA: Diagnosis not present

## 2020-04-21 DIAGNOSIS — K5900 Constipation, unspecified: Secondary | ICD-10-CM | POA: Diagnosis present

## 2020-04-21 DIAGNOSIS — D497 Neoplasm of unspecified behavior of endocrine glands and other parts of nervous system: Secondary | ICD-10-CM | POA: Diagnosis present

## 2020-04-21 DIAGNOSIS — Z8673 Personal history of transient ischemic attack (TIA), and cerebral infarction without residual deficits: Secondary | ICD-10-CM | POA: Diagnosis not present

## 2020-04-21 DIAGNOSIS — G8194 Hemiplegia, unspecified affecting left nondominant side: Secondary | ICD-10-CM | POA: Diagnosis present

## 2020-04-21 DIAGNOSIS — D72829 Elevated white blood cell count, unspecified: Secondary | ICD-10-CM | POA: Diagnosis not present

## 2020-04-21 DIAGNOSIS — Z87891 Personal history of nicotine dependence: Secondary | ICD-10-CM

## 2020-04-21 LAB — CREATININE, SERUM
Creatinine, Ser: 0.78 mg/dL (ref 0.44–1.00)
GFR calc Af Amer: >60 mL/min (ref >60)
GFR calc non Af Amer: >60 mL/min (ref >60)

## 2020-04-21 LAB — CBC
HCT: 36.6 % (ref 36.0–46.0)
Hemoglobin: 12 g/dL (ref 12.0–15.0)
MCH: 30.3 pg (ref 26.0–34.0)
MCHC: 32.8 g/dL (ref 30.0–36.0)
MCV: 92.4 fL (ref 80.0–100.0)
Platelets: 300 10*3/uL (ref 150–400)
RBC: 3.96 MIL/uL (ref 3.87–5.11)
RDW: 12.8 % (ref 11.5–15.5)
WBC: 13.6 10*3/uL — ABNORMAL HIGH (ref 4.0–10.5)
nRBC: 0 % (ref 0.0–0.2)

## 2020-04-21 MED ORDER — POLYETHYLENE GLYCOL 3350 17 G PO PACK: 17.0000 g | PACK | Freq: Every day | ORAL | Status: DC | PRN

## 2020-04-21 MED ORDER — LEVETIRACETAM 750 MG PO TABS
1500.0000 mg | ORAL_TABLET | Freq: Two times a day (BID) | ORAL | Status: DC
  Administered 2020-04-21 – 2020-05-07 (×32): 1500 mg via ORAL
  Filled 2020-04-21 (×33): qty 2

## 2020-04-21 MED ORDER — ACETAMINOPHEN 325 MG PO TABS
650.0000 mg | ORAL_TABLET | ORAL | Status: DC | PRN
  Administered 2020-04-22 – 2020-05-06 (×9): 650 mg via ORAL
  Filled 2020-04-21 (×15): qty 2

## 2020-04-21 MED ORDER — OXYCODONE HCL 5 MG PO TABS: 5.0000 mg | ORAL_TABLET | ORAL | 0 refills | Status: DC | PRN

## 2020-04-21 MED ORDER — VITAMIN D 25 MCG (1000 UNIT) PO TABS
1000.0000 [IU] | ORAL_TABLET | Freq: Every day | ORAL | Status: DC
  Administered 2020-04-22 – 2020-05-07 (×16): 1000 [IU] via ORAL
  Filled 2020-04-21 (×16): qty 1

## 2020-04-21 MED ORDER — OXYCODONE HCL 5 MG PO TABS: 5.0000 mg | ORAL_TABLET | ORAL | Status: DC | PRN

## 2020-04-21 MED ORDER — DEXAMETHASONE 4 MG PO TABS
4.0000 mg | ORAL_TABLET | Freq: Every day | ORAL | Status: DC
  Administered 2020-04-22 – 2020-04-28 (×7): 4 mg via ORAL
  Filled 2020-04-21 (×7): qty 1

## 2020-04-21 MED ORDER — SENNA 8.6 MG PO TABS
1.0000 | ORAL_TABLET | Freq: Two times a day (BID) | ORAL | Status: DC
  Administered 2020-04-21 – 2020-04-23 (×2): 8.6 mg via ORAL
  Filled 2020-04-21 (×4): qty 1

## 2020-04-21 MED ORDER — BISACODYL 5 MG PO TBEC
5.0000 mg | DELAYED_RELEASE_TABLET | Freq: Every day | ORAL | Status: DC | PRN
  Administered 2020-04-28: 5 mg via ORAL
  Filled 2020-04-21 (×2): qty 1

## 2020-04-21 MED ORDER — SORBITOL 70 % SOLN: 30.0000 mL | Freq: Every day | Status: DC | PRN

## 2020-04-21 MED ORDER — HEPARIN SODIUM (PORCINE) 5000 UNIT/ML IJ SOLN
5000.0000 [IU] | Freq: Three times a day (TID) | INTRAMUSCULAR | Status: DC
  Administered 2020-04-21 – 2020-05-02 (×33): 5000 [IU] via SUBCUTANEOUS
  Filled 2020-04-21 (×34): qty 1

## 2020-04-21 MED ORDER — PANTOPRAZOLE SODIUM 40 MG PO TBEC
40.0000 mg | DELAYED_RELEASE_TABLET | Freq: Every day | ORAL | Status: DC
  Administered 2020-04-21 – 2020-05-06 (×16): 40 mg via ORAL
  Filled 2020-04-21 (×16): qty 1

## 2020-04-21 MED ORDER — ONDANSETRON HCL 4 MG PO TABS: 4.0000 mg | ORAL_TABLET | ORAL | Status: DC | PRN

## 2020-04-21 MED ORDER — DEXAMETHASONE 4 MG PO TABS: 4.0000 mg | ORAL_TABLET | Freq: Every day | ORAL | Status: DC

## 2020-04-21 MED ORDER — ACETAMINOPHEN 650 MG RE SUPP: 650.0000 mg | RECTAL | Status: DC | PRN

## 2020-04-21 MED ORDER — LEVETIRACETAM 750 MG PO TABS: 1500.0000 mg | ORAL_TABLET | Freq: Two times a day (BID) | ORAL | Status: DC

## 2020-04-21 MED ORDER — POTASSIUM CHLORIDE CRYS ER 20 MEQ PO TBCR
20.0000 meq | EXTENDED_RELEASE_TABLET | ORAL | Status: DC
  Administered 2020-04-22 – 2020-05-06 (×8): 20 meq via ORAL
  Filled 2020-04-21 (×9): qty 1

## 2020-04-21 MED ORDER — METOPROLOL SUCCINATE ER 25 MG PO TB24
12.5000 mg | ORAL_TABLET | Freq: Every day | ORAL | Status: DC
  Administered 2020-04-22 – 2020-05-07 (×16): 12.5 mg via ORAL
  Filled 2020-04-21 (×16): qty 1

## 2020-04-21 MED ORDER — ACETAMINOPHEN 325 MG PO TABS
650.0000 mg | ORAL_TABLET | Freq: Four times a day (QID) | ORAL | Status: DC | PRN
  Administered 2020-04-24 – 2020-05-06 (×13): 650 mg via ORAL
  Filled 2020-04-21 (×6): qty 2

## 2020-04-21 MED ORDER — PANTOPRAZOLE SODIUM 40 MG PO TBEC: 40.0000 mg | DELAYED_RELEASE_TABLET | Freq: Every day | ORAL | Status: DC

## 2020-04-21 MED ORDER — ONDANSETRON HCL 4 MG/2ML IJ SOLN: 4.0000 mg | INTRAMUSCULAR | Status: DC | PRN

## 2020-04-21 MED ORDER — ATORVASTATIN CALCIUM 40 MG PO TABS
40.0000 mg | ORAL_TABLET | Freq: Every day | ORAL | Status: DC
  Administered 2020-04-22 – 2020-05-07 (×16): 40 mg via ORAL
  Filled 2020-04-21 (×16): qty 1

## 2020-04-21 MED ORDER — HEPARIN SODIUM (PORCINE) 5000 UNIT/ML IJ SOLN: 5000.0000 [IU] | Freq: Three times a day (TID) | INTRAMUSCULAR | Status: DC

## 2020-04-21 NOTE — Progress Notes (Signed)
Inpatient Rehabilitation-Admissions Coordinator   I have received insurance approval and medical clearance from Dr. Sloan Leiter for admit to CIR today. Pt notified of bed offer and has accepted. RN and [facility] team aware of plan for today. I have reviewed insurance benefits letter and consent form with the patient.   Please call if questions.   Raechel Ache, OTR/L  Rehab Admissions Coordinator  [phone] 04/21/2020 11:06 AM

## 2020-04-21 NOTE — Progress Notes (Signed)
Inpatient Rehabilitation Medication Review by a Pharmacist  A complete drug regimen review was completed for this patient to identify any potential clinically significant medication issues.  Clinically significant medication issues were identified:  no     Name of provider notified for urgent issues identified: n/a  Provider Method of Notification: n/a   For non-urgent medication issues to be resolved on team rounds tomorrow morning a [facility] [place] was sent to:    Pharmacist comments:   Time spent performing this drug regimen review (minutes):  5   Markice Torbert A. Levada Dy, PharmD, BCPS, FNKF Clinical Pharmacist [place] Please utilize Amion for appropriate phone number to reach the unit pharmacist ([place])  04/21/2020 2:53 PM

## 2020-04-21 NOTE — Progress Notes (Signed)
Jamse Arn, MD  Physician  Physical Medicine and Rehabilitation  Consult Note      Signed  Date of Service:  04/17/2020 12:17 PM      Related encounter: Admission (Discharged) from 04/12/2020 in [facility]      Signed      Expand AllCollapse All            Physical Medicine and Rehabilitation Consult Reason for Consult: Decreased functional mobility Referring Physician: [facility]   HPI: [name] [name] is a 64 y.o. right-handed female with history of hyperlipidemia, hypertension, history of tobacco use, craniotomy for tumor excision 09/10/2013.  History taken from chart review, husband, and patient.  Patient lives with spouse.  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  Two-level home bed and bath on main level 5 steps to entry.  Good family support.  [name] [name] presented on 04/12/2020 with left hemiparesis and seizures.  Patient on no antiepileptic medication prior to admission.  MRI showed recurrent meningioma along the right frontal parietal convexity and falx with some contralateral extension.  Compression and possible invasion of the adjacent superior sagittal sinus which remained patent.  Patient underwent stereotactic right frontal craniotomy resection of meningioma 04/16/2020 per Dr. Kathyrn Sheriff.  Maintained on Keppra for seizure prophylaxis as well as Decadron protocol.  Therapy evaluations completed with recommendations of physical medicine rehab consult.   Review of Systems  Constitutional: Negative for chills and fever.  HENT: Negative for hearing loss.   Eyes: Negative for blurred vision and double vision.  Respiratory: Negative for cough and shortness of breath.   Cardiovascular: Negative for chest pain and palpitations.  Gastrointestinal: Positive for constipation. Negative for heartburn, nausea and vomiting.  Genitourinary: Negative for dysuria, flank pain and hematuria.  Musculoskeletal: Positive for myalgias.  Skin: Negative for rash.  Neurological:  Positive for dizziness, focal weakness, seizures and headaches. Negative for sensory change and speech change.  All other systems reviewed and are negative.       Past Medical History:  Diagnosis Date  . Bruises easily    . Carotid artery occlusion    . Dizziness    . Headache(784.0)    . Hyperlipidemia      takes Atorvastatin daily  . PONV (postoperative nausea and vomiting)    . Stroke ([place])    . TIA (transient ischemic attack)           Past Surgical History:  Procedure Laterality Date  . ABDOMINAL HYSTERECTOMY   2002  . COLONOSCOPY      . CRANIOTOMY Right 09/10/2013    Procedure: Craniotomy for tumor excision;  Surgeon: Consuella Lose, MD;  Location: [facility];  Service: Neurosurgery;  Laterality: Right;  Craniotomy for meningioma with stealth  . DILATION AND CURETTAGE OF UTERUS   1980  . left hand surgery   2007 and 2008    plates and screws         Family History  Problem Relation Age of Onset  . Cancer Father          Prostate  . Deep vein thrombosis Father      Social History:  reports that [name] [name] has quit smoking. [name] [name] has never used smokeless tobacco. [name] [name] reports that [name] [name] does not drink alcohol or use drugs. Allergies: No Known Allergies       Medications Prior to Admission  Medication Sig Dispense Refill  . acetaminophen (TYLENOL) 325 MG tablet Take 650 mg by mouth every 6 (six) hours as needed for  moderate pain or headache.      Marland Kitchen aspirin 81 MG tablet Take 81 mg by mouth daily.      Marland Kitchen atorvastatin (LIPITOR) 20 MG tablet Take 20 mg by mouth daily.      . Cholecalciferol (VITAMIN D3) 2000 UNITS TABS Take 1 tablet by mouth daily.      Marland Kitchen ibuprofen (ADVIL,MOTRIN) 200 MG tablet Take 200-400 mg by mouth every 6 (six) hours as needed for headache or moderate pain.      . metoprolol succinate (TOPROL-XL) 25 MG 24 hr tablet Take 12.5 mg by mouth daily.      Marland Kitchen OVER THE COUNTER MEDICATION Take 1 tablet by mouth daily. Vit B Stress Tablet      . potassium chloride SA  (K-DUR,KLOR-CON) 20 MEQ tablet Take 20 mEq by mouth every other day.          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 Functional Status:  Mobility: Bed Mobility Overal bed mobility: Needs Assistance Bed Mobility: Supine to Sit Supine to sit: Min assist, HOB elevated(pt utilizing bed rail) Transfers Overall transfer level: Needs assistance Equipment used: 1 person hand held assist Transfers: Sit to/from Stand, Technical brewer Transfers Sit to Stand: [place] assist Stand pivot transfers: Min assist General transfer comment: PT provides Left knee block and BUE support, pt with mild L knee buckling with stand pivot, slow and deliberate steps with turning from bed to recliner Ambulation/Gait Ambulation/Gait assistance: (pt defers to next session)   ADL:   Cognition: Cognition Overall Cognitive Status: Impaired/Different from baseline Orientation Level: Oriented X4 Cognition Arousal/Alertness: Awake/alert Behavior During Therapy: WFL for tasks assessed/performed Overall Cognitive Status: Impaired/Different from baseline Area of Impairment: Problem solving Problem Solving: Slow processing   Blood pressure 121/67, pulse 97, temperature 98 F (36.7 C), temperature source Oral, resp. rate 17, height 5\' 3"  (1.6 m), weight 78 kg, SpO2 97 %. Physical Exam  Vitals reviewed. Constitutional: [name] [name] appears well-developed and  well-nourished.  HENT:  Craniotomy site is dressed Right frontal edema  Eyes: Right eye exhibits no discharge. Left eye exhibits no discharge.  Unable to move eyes past midline on left  Neck: No tracheal deviation present. No thyromegaly present.  Respiratory: Effort normal. No stridor. No respiratory distress.  GI: Soft. [name] [name] exhibits no distension.  Musculoskeletal:     Comments: No edema or tenderness in extremities  Neurological: [name] [name] is alert.  Right gaze preference.   Provides [name] [name] name age and date of birth.   Fair awareness of deficits. Motor: RUE/RLE: 5/5 proximal distal LUE: 0/5 proximal distal LLE: Hip flexion, knee extension 2+/5, dorsiflexion 0/5  Skin:  Right [place] site with dressing C/D/I  Psychiatric: [name] [name] has a normal mood and affect. [name] [name] behavior is normal.      Lab Results Last 24 Hours  Results for orders placed or performed during the hospital encounter of 04/12/20 (from the past 24 hour(s))  SARS Coronavirus 2 by RT PCR (hospital order, performed in [facility] hospital lab) Nasopharyngeal Nasopharyngeal Swab     Status: None  Collection Time: 04/16/20  1:00 PM    Specimen: Nasopharyngeal Swab  Result Value Ref Range    SARS Coronavirus 2 NEGATIVE NEGATIVE  Basic metabolic panel     Status: Abnormal    Collection Time: 04/17/20  3:15 AM  Result Value Ref Range    Sodium 139 135 - 145 mmol/L    Potassium 4.0 3.5 - 5.1 mmol/L    Chloride 106 98 - 111 mmol/L    CO2 24 22 - 32 mmol/L    Glucose, Bld 183 (H) 70 - 99 mg/dL    BUN 19 8 - 23 mg/dL    Creatinine, Ser 0.84 0.44 - 1.00 mg/dL    Calcium 7.9 (L) 8.9 - 10.3 mg/dL    GFR calc non Af Amer >60 >60 mL/min    GFR calc Af Amer >60 >60 mL/min    Anion gap 9 5 - 15  CBC     Status: Abnormal    Collection Time: 04/17/20  3:15 AM  Result Value Ref Range    WBC 16.0 (H) 4.0 - 10.5 K/uL    RBC 3.76 (L) 3.87 - 5.11 MIL/uL    Hemoglobin 11.3 (L) 12.0 - 15.0 g/dL    HCT 34.9 (L) 36.0 - 46.0 %    MCV 92.8  80.0 - 100.0 fL    MCH 30.1 26.0 - 34.0 pg    MCHC 32.4 30.0 - 36.0 g/dL    RDW 12.5 11.5 - 15.5 %    Platelets 222 150 - 400 K/uL    nRBC 0.0 0.0 - 0.2 %       Imaging Results (Last 48 hours)  DG Chest 1 View   Result Date: 04/16/2020 CLINICAL DATA:  Central venous catheter placement EXAM: CHEST  1 VIEW COMPARISON:  04/12/2020 FINDINGS: Single frontal view of the chest demonstrates a left internal jugular central venous catheter tip overlying superior vena cava. Cardiac silhouette is unremarkable. No airspace disease, effusion, or pneumothorax. No acute bony abnormalities. IMPRESSION: 1. No complications after left internal jugular catheter placement. 2. No acute intrathoracic process. Electronically Signed   By: Randa Ngo M.D.   On: 04/16/2020 19:15    MR BRAIN W WO CONTRAST   Result Date: 04/17/2020 CLINICAL DATA:  Recurrent meningioma post resection EXAM: MRI HEAD WITHOUT AND WITH CONTRAST TECHNIQUE: Multiplanar, multiecho pulse sequences of the brain and surrounding structures were obtained without and with intravenous contrast. CONTRAST:  71mL GADAVIST GADOBUTROL 1 MMOL/ML IV SOLN COMPARISON:  04/13/2020 FINDINGS: Brain: Postoperative changes of right vertex craniotomy for resection of underlying meningioma. There is a thin underlying extra-axial collection of air, fluid, and blood products. Extra-axial air is present along the anterior right frontal convexity. Reduced diffusion in the underlying right frontoparietal lobes likely reflects a combination postoperative contusion/ischemia and blood products, noting subarachnoid and also probable parenchymal susceptibility. There has been significant resection of the recurrent meningioma. There is possible small volume residual enhancing nodular soft tissue along the right aspect falx on series 18, image 46 measuring approximately 1.4 x 0.6 cm axially. Possible additional residual tumor within the superior sagittal sinus with adjacent minimal  contralateral extension (series 18, image 50). There is mild edema. No significant mass effect. No hydrocephalus. Vascular: Major vessel flow voids at the skull base are preserved. Skull and upper cervical spine: Postoperative changes. Normal marrow signal is preserved. Sinuses/Orbits: Paranasal sinuses are aerated. Orbits are unremarkable. Other: Sella is unremarkable.  Mastoid air cells are clear. IMPRESSION: Postoperative changes of extensive resection  of recurrent meningioma. Possible two small areas of residual tumor detailed above for which attention on follow-up is recommended Electronically Signed   By: Macy Mis M.D.   On: 04/17/2020 08:28       Assessment/Plan: Diagnosis: Recurrent right frontal craniotomy status post resection Labs independently reviewed.  Records reviewed and summated above.   1. Does the need for close, 24 hr/day medical supervision in concert with the patient's rehab needs make it unreasonable for this patient to be served in a less intensive setting? Yes 2. Co-Morbidities requiring supervision/potential complications: hyperlipidemia, HTN (monitor and provide prns in accordance with increased physical exertion and pain), history of tobacco use, hx of craniotomy s/p tumor excision, seizures, steroid-induced leukocytosis, ABLA (repeat labs, consider transfusion if necessary to ensure appropriate perfusion for increased activity tolerance) 3. Due to safety, skin/wound care, medication administration, pain management and patient education, does the patient require 24 hr/day rehab nursing? Yes 4. Does the patient require coordinated care of a physician, rehab nurse, therapy disciplines of PT/OT to address physical and functional deficits in the context of the above medical diagnosis(es)? Yes Addressing deficits in the following areas: balance, endurance, locomotion, strength, transferring, bathing, dressing, toileting and psychosocial support 5. Can the patient actively  participate in an intensive therapy program of at least 3 hrs of therapy per day at least 5 days per week? Yes 6. The potential for patient to make measurable gains while on inpatient rehab is excellent 7. Anticipated functional outcomes upon discharge from inpatient rehab are supervision and min assist  with PT, supervision and min assist with OT, n/a with SLP. 8. Estimated rehab length of stay to reach the above functional goals is: 12 to 16 days. 9. Anticipated discharge destination: Home 10. Overall Rehab/Functional Prognosis: good   RECOMMENDATIONS: This patient's condition is appropriate for continued rehabilitative care in the following setting: Recommend CIR, however patient refusing stating [name] [name] has 3 young children to take care of and that [name] [name] was discharged after 3 days with rolling walker after last surgery with similar deficits.  Patient doing fairly well on day of evaluation and possible reach supervision level of functioning after a few days, however inform patient we would follow-up if this was not the case.  Patient has agreed to participate in recommended program. Potentially Note that insurance prior authorization may be required for reimbursement for recommended care.   Comment: Rehab Admissions Coordinator to follow up.   I have personally performed a face to face diagnostic evaluation, including, but not limited to relevant history and physical exam findings, of this patient and developed relevant assessment and plan.  Additionally, I have reviewed and concur with the physician assistant's documentation above.    Delice Lesch, MD, ABPMR Lavon Paganini Angiulli, PA-C 04/17/2020        Revision History      Routing History

## 2020-04-21 NOTE — Progress Notes (Signed)
Inpatient Rehabilitation Care Coordinator Assessment and Plan  Patient Details  Name: [name] [name] MRN: [id number] Date of Birth: [birth year]  Today's Date: 04/21/2020  Problem List:  Patient Active Problem List   Diagnosis Date Noted  . Meningioma ([place]) 04/21/2020  . Left hemiparesis ([place]) 04/21/2020  . Dyslipidemia   . Essential hypertension   . History of tobacco abuse   . History of meningioma   . Leucocytosis   . Acute blood loss anemia   . Brain tumor ([place]) 04/13/2020  . Seizure ([place]) 04/12/2020  . Occlusion and stenosis of carotid artery without mention of cerebral infarction 08/10/2012  . Fibromuscular dysplasia ([place]) 08/10/2012   Past Medical History:  Past Medical History:  Diagnosis Date  . Bruises easily   . Carotid artery occlusion   . Dizziness   . Headache(784.0)   . Hyperlipidemia    takes Atorvastatin daily  . PONV (postoperative nausea and vomiting)   . Stroke ([place])   . TIA (transient ischemic attack)    Past Surgical History:  Past Surgical History:  Procedure Laterality Date  . ABDOMINAL HYSTERECTOMY  2002  . APPLICATION OF CRANIAL NAVIGATION N/A 04/16/2020   Procedure: APPLICATION OF CRANIAL NAVIGATION;  Surgeon: Consuella Lose, MD;  Location: [facility];  Service: Neurosurgery;  Laterality: N/A;  . COLONOSCOPY    . CRANIOTOMY Right 09/10/2013   Procedure: Craniotomy for tumor excision;  Surgeon: Consuella Lose, MD;  Location: [facility];  Service: Neurosurgery;  Laterality: Right;  Craniotomy for meningioma with stealth  . CRANIOTOMY N/A 04/16/2020   Procedure: STEREOTACTIC CRANIOTOMY FOR RESECTION OF MENINGIOMA;  Surgeon: Consuella Lose, MD;  Location: [facility];  Service: Neurosurgery;  Laterality: N/A;  . DILATION AND CURETTAGE OF UTERUS  1980  . left hand surgery  2007 and 2008   plates and screws   Social History:  reports that she has quit smoking. She has never used smokeless tobacco. She reports that she does not drink alcohol or use  drugs.  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 Read: Yes Write: Yes Employment Status: Employed(LPN) Return to Work Plans: Plans to return   Abuse/Neglect Abuse/Neglect Assessment Can Be Completed: Yes Physical Abuse: Denies Verbal Abuse: Denies Sexual Abuse: Denies Exploitation of patient/patient's resources: Denies Self-Neglect: Denies  Emotional Status Pt's affect, behavior and adjustment status: patient states she feels about the same Recent Psychosocial Issues: no Psychiatric History: no Substance Abuse History: no  Patient / Family Perceptions, Expectations & Goals Pt/Family understanding of illness & functional limitations: yes Pt/family expectations/goals: Goal to discharge home  [place]: None Premorbid Home Care/DME Agencies: None Transportation available at discharge: Family able to transport  Discharge Planning Living Arrangements: Spouse/significant other Support Systems: Spouse/significant other Type of Residence: Private residence Insurance Resources: Multimedia programmer (specify)(BCBS) Financial Screen Referred: No Living Expenses: Medical laboratory scientific officer Management: Patient, Significant Other Does the patient have any problems obtaining your medications?: No Care Coordinator Anticipated Follow Up Needs: HH/OP Expected length of stay: 10-14 Days  Clinical Impression Sw entered room to introduce self, explain role and process. Nursing staff entered and checked vitals. Patient's family also entered as well, stayed at bedside. Provided family with same information. SW will continue to follow up with  questions  and concerns.   Dyanne Iha 04/21/2020, 3:40 PM

## 2020-04-21 NOTE — Progress Notes (Signed)
[facility] Individual Statement of Services  Patient Name:  [name] [name]  Date:  04/21/2020  Welcome to the [facility].  Our goal is to provide you with an individualized program based on your diagnosis and situation, designed to meet your specific needs.  With this comprehensive rehabilitation program, you will be expected to participate in at least 3 hours of rehabilitation therapies Monday-Friday, with modified therapy programming on the weekends.  Your rehabilitation program will include the following services:  Physical Therapy (PT), Occupational Therapy (OT), Speech Therapy (ST), 24 hour per day rehabilitation nursing, Therapeutic Recreaction (TR), Neuropsychology, Care Coordinator, Rehabilitation Medicine, Nutrition Services, Pharmacy Services and Other  Weekly team conferences will be held on Wednesdays to discuss your progress.  Your Inpatient Rehabilitation Care Coordinator will talk with you frequently to get your input and to update you on team discussions.  Team conferences with you and your family in attendance may also be held.  Expected length of stay: 10-14 Days  Overall anticipated outcome: Supervision/Min A  Depending on your progress and recovery, your program may change. Your Inpatient Rehabilitation Care Coordinator will coordinate services and will keep you informed of any changes. Your Inpatient Rehabilitation Care Coordinator's name and contact numbers are listed  below.  The following services may also be recommended but are not provided by the [facility]:    [place] will be made to provide these services after discharge if needed.  Arrangements include referral to agencies that provide these services.  Your insurance has been verified to be:  [place] Your primary doctor is:  Judd Lien, MD  Pertinent information will  be shared with your doctor and your insurance company.  Inpatient Rehabilitation Care Coordinator:  Erlene Quan, [place] or [phone]  Information discussed with and copy given to patient by: Dyanne Iha, 04/21/2020, 2:58 PM

## 2020-04-21 NOTE — H&P (Signed)
Physical Medicine and Rehabilitation Admission H&P     HPI: [name] [name] is a 64 year old right-handed female with history of hyperlipidemia, hypertension, history of tobacco abuse, craniotomy for tumor excision/meningioma 09/10/2013.  Per chart review lives with husband.  Independent working as a Marine scientist.  Two-level home bed and bath on main level 5 steps to entry and good family support.  Presented 04/12/2020 with left hemiparesis and seizure.  Patient on no antiepileptic medication prior to admission.  Admission chemistries unremarkable except glucose mildly elevated 139.  EEG identified no seizure.  MRI showed recurrent meningioma along the right frontoparietal convexity and falx with some contralateral extension.  Compression and possible invasion of the adjacent superior sagittal sinus which remain patent.  Patient underwent stereotactic right frontal craniotomy resection of meningioma 04/16/2020 per Dr. Kathyrn Sheriff.  Maintained on Keppra for seizure prophylaxis as well as Decadron protocol.  Subcutaneous heparin added for DVT prophylaxis 04/19/2020.  Patient is tolerating a regular diet.  Therapy evaluations completed and patient was admitted for a comprehensive rehab program.  Pt reports bowels back in scheduled- usually goes ~ 5:30 am when wakes up and then ~ 7-8 am after eats- is doing that again.  Thinks cognition is "ok"- couldn't see well initially- is improving, but not back to baseline- things are blurry.  Voiding well with Purewick- will stop with CIR_ made pt aware.     Review of Systems  Constitutional: Negative for chills and fever.  HENT: Negative for hearing loss.   Eyes: Negative for blurred vision and double vision.  Respiratory: Negative for cough and shortness of breath.   Cardiovascular: Negative for chest pain, palpitations and leg swelling.  Gastrointestinal: Negative for constipation, heartburn, nausea and vomiting.  Genitourinary: Negative for dysuria, flank pain and  hematuria.  Musculoskeletal: Positive for joint pain.  Skin: Negative for rash.  Neurological: Positive for dizziness, seizures, weakness and headaches.  All other systems reviewed and are negative.  Past Medical History:  Diagnosis Date  . Bruises easily   . Carotid artery occlusion   . Dizziness   . Headache(784.0)   . Hyperlipidemia    takes Atorvastatin daily  . PONV (postoperative nausea and vomiting)   . Stroke ([place])   . TIA (transient ischemic attack)    Past Surgical History:  Procedure Laterality Date  . ABDOMINAL HYSTERECTOMY  2002  . APPLICATION OF CRANIAL NAVIGATION N/A 04/16/2020   Procedure: APPLICATION OF CRANIAL NAVIGATION;  Surgeon: Consuella Lose, MD;  Location: [place];  Service: Neurosurgery;  Laterality: N/A;  . COLONOSCOPY    . CRANIOTOMY Right 09/10/2013   Procedure: Craniotomy for tumor excision;  Surgeon: Consuella Lose, MD;  Location: [facility];  Service: Neurosurgery;  Laterality: Right;  Craniotomy for meningioma with stealth  . CRANIOTOMY N/A 04/16/2020   Procedure: STEREOTACTIC CRANIOTOMY FOR RESECTION OF MENINGIOMA;  Surgeon: Consuella Lose, MD;  Location: [place];  Service: Neurosurgery;  Laterality: N/A;  . DILATION AND CURETTAGE OF UTERUS  1980  . left hand surgery  2007 and 2008   plates and screws   Family History  Problem Relation Age of Onset  . Cancer Father        Prostate  . Deep vein thrombosis Father    Social History:  reports that she has quit smoking. She has never used smokeless tobacco. She reports that she does not drink alcohol or use drugs. Allergies: No Known Allergies Medications Prior to Admission  Medication Sig Dispense Refill  . acetaminophen (TYLENOL) 325 MG  tablet Take 650 mg by mouth every 6 (six) hours as needed for moderate pain or headache.    Marland Kitchen atorvastatin (LIPITOR) 20 MG tablet Take 20 mg by mouth daily.    . Cholecalciferol (VITAMIN D3) 2000 UNITS TABS Take 1 tablet by mouth daily.    Derrill Memo ON  04/22/2020] dexamethasone (DECADRON) 4 MG tablet Take 1 tablet (4 mg total) by mouth daily.    Marland Kitchen levETIRAcetam (KEPPRA) 750 MG tablet Take 2 tablets (1,500 mg total) by mouth 2 (two) times daily.    . metoprolol succinate (TOPROL-XL) 25 MG 24 hr tablet Take 12.5 mg by mouth daily.    Marland Kitchen OVER THE COUNTER MEDICATION Take 1 tablet by mouth daily. Vit B Stress Tablet    . oxyCODONE (OXY IR/ROXICODONE) 5 MG immediate release tablet Take 1-2 tablets (5-10 mg total) by mouth every 3 (three) hours as needed for breakthrough pain. 30 tablet 0  . pantoprazole (PROTONIX) 40 MG tablet Take 1 tablet (40 mg total) by mouth daily with supper.    . potassium chloride SA (K-DUR,KLOR-CON) 20 MEQ tablet Take 20 mEq by mouth every other day.      Drug Regimen Review Drug regimen was reviewed and remains appropriate with no significant issues identified  Home:     Functional History:    Functional Status:  Mobility:          ADL:    Cognition:      Physical Exam: There were no vitals taken for this visit. Physical Exam  Nursing note and vitals reviewed. Constitutional: She is oriented to person, place, and time. She appears well-developed and well-nourished.  Appears Younger than stated age- sitting up in bed eating breakfast/10 am or later, appropriate, NAD  HENT:  Head: Normocephalic.  Nose: Nose normal.  Mouth/Throat: Oropharynx is clear and moist. No oropharyngeal exudate.  Pt has R skull craniotomy incision vertical with dressing across it- staples in place;  L facial droop Facial sensation intact; L tongue deviation  Eyes: Conjunctivae are normal.  EOMs- can look R, but not able to look completely Left- can go past midline No nystagmus seen  Neck: No tracheal deviation present.  Cardiovascular:  RRR- no M/R/G  Respiratory: No stridor.  CTA B/L- no W/R/R- good air movement  GI:  Soft, NT, ND, (+)BS    Musculoskeletal:     Cervical back: Normal range of motion and neck supple.      Comments: RUE- deltoid, biceps, triceps, WE, grip and finger abd 5/5 LUE- delt 3-/5, biceps 3-/5, triceps 3-/5- otherwise 0/5 in distal muscles RLE_ 5/5 in HF, KE, KF, DF and PF LLE- HF 3-/5, KE and KF 3-/5; DF 0/5; PF 2-/5  Neurological: She is alert and oriented to person, place, and time.  Intact to light touch in all 4 extremities Ox3 and appropriate in answers and simple problem solving   Skin: Skin is warm and dry.  L forearm IV- looks great- no skin issues seen    Results for orders placed or performed during the hospital encounter of 04/12/20 (from the past 48 hour(s))  CBC with Differential/Platelet     Status: Abnormal   Collection Time: 04/20/20  3:26 AM  Result Value Ref Range   WBC 12.5 (H) 4.0 - 10.5 K/uL   RBC 3.59 (L) 3.87 - 5.11 MIL/uL   Hemoglobin 10.7 (L) 12.0 - 15.0 g/dL   HCT 33.7 (L) 36.0 - 46.0 %   MCV 93.9 80.0 - 100.0 fL  MCH 29.8 26.0 - 34.0 pg   MCHC 31.8 30.0 - 36.0 g/dL   RDW 12.7 11.5 - 15.5 %   Platelets 227 150 - 400 K/uL   nRBC 0.2 0.0 - 0.2 %   Neutrophils Relative % 53 %   Neutro Abs 6.6 1.7 - 7.7 K/uL   Lymphocytes Relative 36 %   Lymphs Abs 4.5 (H) 0.7 - 4.0 K/uL   Monocytes Relative 10 %   Monocytes Absolute 1.2 (H) 0.1 - 1.0 K/uL   Eosinophils Relative 0 %   Eosinophils Absolute 0.0 0.0 - 0.5 K/uL   Basophils Relative 0 %   Basophils Absolute 0.0 0.0 - 0.1 K/uL   Immature Granulocytes 1 %   Abs Immature Granulocytes 0.14 (H) 0.00 - 0.07 K/uL    Comment: Performed at [facility] [address], [place], [place] [address]   No results found.     Medical Problem List and Plan: 1.  Left side weakness secondary to recurrent meningioma status post right frontal craniotomy resection 04/16/2020- previous craniotomy for meningioma was 2014  -patient may shower if uses shower cap until staples out  -ELOS/Goals: 2-2.5 weeks- goals Mod I to supervision 2.  Antithrombotics: -DVT/anticoagulation: Subcutaneous heparin initiated  04/19/2020  -antiplatelet therapy: N/A 3. Pain Management: Oxycodone as needed 4. Mood: Provide emotional support  -antipsychotic agents: N/A 5. Neuropsych: This patient is capable of making decisions on her own behalf. 6. Skin/Wound Care: Routine skin checks 7. Fluids/Electrolytes/Nutrition: Routine in and outs with follow-up chemistries 8.  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  Keppra 1500 mg twice daily 9.  Hypertension.  Toprol 12.5 mg daily.  Monitor with increased mobility 10.  Constipation.  Currently on MiraLAX as needed.  Adjust bowel program as needed 11.  Hyperlipidemia.  Lipitor 12.  History of tobacco use.  Provide counseling 13. Previous craniotomy with mild L hemiparesis since 2014 for meningioma resection    Nelva Nay, PA-C 04/21/2020   I have personally performed a face to face diagnostic evaluation of this patient and formulated the key components of the plan.  Additionally, I have personally reviewed laboratory data, imaging studies, as well as relevant notes and concur with the physician assistant's documentation above.   The patient's status has not changed from the original H&P.  Any changes in documentation from the acute care chart have been noted above.    Courtney Heys, MD 04/21/2020

## 2020-04-21 NOTE — Progress Notes (Addendum)
°  NEUROSURGERY PROGRESS NOTE   No issues overnight.  Patient now interested in CIR No concerns this am  EXAM:  BP (!) 112/55 (BP Location: Right Arm)    Pulse 86    Temp 97.9 F (36.6 C) (Oral)    Resp 16    Ht 5\' 3"  (1.6 m)    Wt 78 kg    SpO2 97%    BMI 30.46 kg/m    Awake, alert, oriented  Speech fluent, appropriate  CN grossly intact  5/5 RUE/RLE Stable LUE/LLE weakness distal>proximal  IMPRESSION/PLAN 64 y.o. female POD4 crani for resection of recurrent meningioma. Stable neurologically.  - CIR when bed available   I have seen and examined Darrick Huntsman and agree with the exam, impression, and plan as documented in the note above  Consuella Lose, MD [facility]

## 2020-04-21 NOTE — Progress Notes (Signed)
Raechel Ache, [place]  Rehab Admission Coordinator  Physical Medicine and Rehabilitation  PMR Pre-admission      Signed  Date of Service:  04/20/2020  1:40 PM      Related encounter: Admission (Discharged) from 04/12/2020 in [facility]      Signed        PMR Admission Coordinator Pre-Admission Assessment   Patient: [name] [name] is an 64 y.o., female MRN: [id number] DOB: [birth year] Height: '5\' 3"'  (160 cm) Weight: 78 kg                                                                                                                                                  Insurance Information HMO:     PPO: yes     PCP:      IPA:      80/20:      OTHER:  PRIMARY: Rickey Primus      Policy#: [id number]      Subscriber: patient CM Name: Manual Meier       Phone#: [phone]     Fax#: [phone] Pre-Cert#: [id number]      Employer:  [employer] Kaufmann provided by Manual Meier for admit to CIR 6/8. Pt is approved for 10 days (6/8-6/17). Follow up CM is Suanna (p): [phone] (f): [phone] Benefits:  Phone #: [phone]    Name: [email or web address] provider portal   Eff. Date: 11/15/19-11/13/20      Deduct: $4,000 ($41.53 met)      Out of Pocket Max: $6,500 (includes deductible - $190.63 met)      Life Max: NA  CIR: 70% coverage, 30% co-insurance  SNF: 70% coverage, 30% co-insurance; limited to 60 days per cal. yr Outpatient: 70% coverage, 30% co-insurance; limited to 30 visits per cal year per PT/OT/ST       Co-Pay:  Home Health: 70% coverage, 30% co-insurance; limited by medical necessity     DME: 70% coverage, 30% co-insurance  Providers:  SECONDARY: None      Policy#:       Phone#:    Development worker, community:       Phone#:    The Engineer, petroleum" for patients in Inpatient Rehabilitation Facilities with attached "Privacy Act [place] Records" was provided and verbally reviewed with: N/A   Emergency Contact Information         Contact Information     Name Relation  Home Work Mobile    Kaufman,Bruce Spouse [phone]   [phone]       Current Medical History  Patient Admitting Diagnosis: Recurrent right frontal craniotomy status post resection   History of Present Illness: Stepfanie [name] is a 64 year old female with history of hyperlipidemia, hypertension, history of tobacco abuse, craniotomy for tumor excision/meningioma 09/10/2013.  Per chart review lives with husband.  Independent working as an Corporate treasurer.  Two-level home bed and bath on  main level 5 steps to entry and good family support.  Presented 04/12/2020 with left hemiparesis and seizure.  Patient on no antiepileptic medication prior to admission.  Admission chemistries unremarkable except glucose mildly elevated 139.  EEG identified no seizure.  MRI showed recurrent meningioma along the right frontoparietal convexity and falx with some contralateral extension.  Compression and possible invasion of the adjacent superior sagittal sinus which remain patent.  Patient underwent stereotactic right frontal craniotomy resection of meningioma 04/16/2020 per Dr. Kathyrn Sheriff.  Maintained on Keppra for seizure prophylaxis as well as Decadron protocol.  Subcutaneous heparin added for DVT prophylaxis 04/19/2020.  Patient is tolerating a regular diet.  Therapy evaluations completed and patient is to be admitted for a comprehensive rehab program on 04/21/20. Glasgow Coma Scale Score: 15   Past Medical History      Past Medical History:  Diagnosis Date  . Bruises easily    . Carotid artery occlusion    . Dizziness    . Headache(784.0)    . Hyperlipidemia      takes Atorvastatin daily  . PONV (postoperative nausea and vomiting)    . Stroke ([place])    . TIA (transient ischemic attack)        Family History  family history includes Cancer in her father; Deep vein thrombosis in her father.   Prior Rehab/Hospitalizations:  Has the patient had prior rehab or hospitalizations prior to admission? No   Has the patient had  major surgery during 100 days prior to admission? Yes   Current Medications    Current Facility-Administered Medications:  .  acetaminophen (TYLENOL) tablet 650 mg, 650 mg, Oral, Q4H PRN, 650 mg at 04/20/20 2209 **OR** acetaminophen (TYLENOL) suppository 650 mg, 650 mg, Rectal, Q4H PRN, Costella, Vincent J, PA-C .  acetaminophen (TYLENOL) tablet 650 mg, 650 mg, Oral, Q6H PRN, Erline Levine, MD, 650 mg at 04/14/20 0941 .  atorvastatin (LIPITOR) tablet 40 mg, 40 mg, Oral, Daily, Erline Levine, MD, 40 mg at 04/21/20 0857 .  bisacodyl (DULCOLAX) EC tablet 5 mg, 5 mg, Oral, Daily PRN, Costella, Vincent J, PA-C .  cholecalciferol (VITAMIN D3) tablet 1,000 Units, 1,000 Units, Oral, Daily, Erline Levine, MD, 1,000 Units at 04/21/20 0857 .  dexamethasone (DECADRON) tablet 4 mg, 4 mg, Oral, Daily, Ostergard, Thomas A, MD, 4 mg at 04/21/20 0857 .  heparin injection 5,000 Units, 5,000 Units, Subcutaneous, Q8H, Ostergard, Joyice Faster, MD, 5,000 Units at 04/21/20 0546 .  hydrALAZINE (APRESOLINE) injection 5-20 mg, 5-20 mg, Intravenous, Q4H PRN, Costella, Vista Mink, PA-C .  HYDROcodone-acetaminophen (NORCO/VICODIN) 5-325 MG per tablet 1 tablet, 1 tablet, Oral, Q4H PRN, Costella, Vista Mink, PA-C, 1 tablet at 04/17/20 1158 .  HYDROmorphone (DILAUDID) injection 0.5-1 mg, 0.5-1 mg, Intravenous, Q2H PRN, Costella, Vincent J, PA-C, 0.5 mg at 04/17/20 0427 .  labetalol (NORMODYNE) injection 10-40 mg, 10-40 mg, Intravenous, Q10 min PRN, Costella, Vincent J, PA-C, 20 mg at 04/17/20 0728 .  levETIRAcetam (KEPPRA) tablet 1,500 mg, 1,500 mg, Oral, BID, Kerney Elbe, MD, 1,500 mg at 04/21/20 0858 .  metoprolol succinate (TOPROL-XL) 24 hr tablet 12.5 mg, 12.5 mg, Oral, Daily, Erline Levine, MD, 12.5 mg at 04/21/20 0857 .  naloxone [facility]) injection 0.08 mg, 0.08 mg, Intravenous, PRN, Costella, Vincent J, PA-C .  ondansetron (ZOFRAN) tablet 4 mg, 4 mg, Oral, Q4H PRN **OR** ondansetron (ZOFRAN) injection 4 mg, 4 mg, Intravenous,  Q4H PRN, Costella, Vincent J, PA-C, 4 mg at 04/17/20 0427 .  oxyCODONE (Oxy IR/ROXICODONE) immediate release tablet 5-10  mg, 5-10 mg, Oral, Q3H PRN, Costella, Vincent J, PA-C .  pantoprazole (PROTONIX) EC tablet 40 mg, 40 mg, Oral, Q supper, Alvira Philips, [place], 40 mg at 04/20/20 1900 .  polyethylene glycol (MIRALAX / GLYCOLAX) packet 17 g, 17 g, Oral, Daily PRN, Costella, Vincent J, PA-C .  potassium chloride SA (KLOR-CON) CR tablet 20 mEq, 20 mEq, Oral, QODAY, Ghimire, Dante Gang, MD, 20 mEq at 04/20/20 1035 .  promethazine (PHENERGAN) tablet 12.5-25 mg, 12.5-25 mg, Oral, Q4H PRN, Costella, Vista Mink, PA-C .  senna (SENOKOT) tablet 8.6 mg, 1 tablet, Oral, BID, Costella, Vincent J, PA-C, 8.6 mg at 04/20/20 2210 .  sodium chloride flush (NS) 0.9 % injection 10-40 mL, 10-40 mL, Intracatheter, Q12H, Barb Merino, MD, 10 mL at 04/21/20 0858 .  sodium chloride flush (NS) 0.9 % injection 10-40 mL, 10-40 mL, Intracatheter, PRN, Barb Merino, MD .  sodium phosphate (FLEET) 7-19 GM/118ML enema 1 enema, 1 enema, Rectal, Once PRN, Costella, Vista Mink, PA-C   Patients Current Diet:     Diet Order                      Diet - low sodium heart healthy           Diet regular Room service appropriate? 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  Independent   Indoor Mobility: Did the patient need assistance with walking from room to room (with  or without device)? Independent   Stairs: Did the patient need assistance with internal or external stairs (with or without device)? Independent   Functional Cognition: Did the patient need help planning regular tasks such as shopping or remembering to take medications? Independent   Home Assistive Devices / Equipment Home Assistive Devices/Equipment: Eyeglasses Home Equipment: None   Prior Device Use: Indicate devices/aids used by the patient prior to current illness, exacerbation or injury? 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  She is able to begin to isolate shoulder flexion/extension.  She demonstrates gross grasp of Lt hand, with no release.  Pt frequently states "I can't" when asked to move Lt UE and instead attempts to lift it with her Rt UE  LUE Sensation: decreased light touch, decreased proprioception LUE Coordination: decreased fine motor(twitching of L fingers with attempts at gross and fine move)  Lower Extremity Assessment: LLE deficits/detail LLE Deficits / Details: 3-/5 knee extension, 3/5 knee flexion, 1/5 PF, 0/5 DF, 3-/5 hip flexion     ADLs  Overall ADL's : Needs assistance/impaired Eating/Feeding: Set up, Supervision/ safety, Sitting Grooming: Oral care, Standing, Supervision/safety, Set up, Minimal assistance, Cueing for sequencing, Cueing for safety, Cueing for compensatory techniques Grooming Details (indicate cue type and reason): MIN A for  standing balance on R side; cues for compensatory techniques related to using LUE as stabilizer and hemi techniques Upper Body Bathing: Moderate assistance, Sitting Lower Body Bathing: Sit to/from stand, Moderate assistance Upper Body Dressing : Moderate assistance, Sitting Lower Body Dressing: Min guard, Sitting/lateral leans Lower Body Dressing Details (indicate cue type and reason): to adjust socks from EOB and recliner Toilet Transfer: Minimal assistance, +2 for safety/equipment, +2 for physical assistance, Ambulation, RW Toilet Transfer Details (indicate cue type and reason): simulated via functional mobility with RW. pts LUE buckles during mobility needing +2 to stabilize on LLE. pt requries hand over hand assist maintain L hand on RW Toileting- Clothing Manipulation and Hygiene: Moderate assistance, Sit to/from stand Functional mobility during ADLs: Minimal assistance, +2 for safety/equipment, +2 for physical assistance, Rolling walker General ADL Comments: pt continues to present with decreased LUE coodination and FMC, generalized weakness, and impaired balance impacting pts ability to engage in BADLs. Session focus on functional mobility with RW and standing ADLs at sink     Mobility   Overal bed mobility: Needs Assistance Bed Mobility: Supine to Sit Supine to sit: Min assist General bed mobility comments: pt needing light MIN A +1 to transition EOB to pts L side; most assist for cues and sequencing     Transfers   Overall transfer level: Needs assistance Equipment used: Rolling walker (2 wheeled)(pt would benefit from trial of hemiwalker, poor grip strength on LUE) Transfers: Sit to/from Stand, [employer] Transfers Sit to Stand: Min assist, +2 physical assistance, +2 safety/equipment Stand pivot transfers: Min assist, +2 safety/equipment General transfer comment: pt required cues for hand placement and  MIN A +2 to initially power into standing from EOB; pt requires assist to  stabilize LLE during mobility d/t  poor GM coordination in LLE during mobility needing assist to facilitate functional dorsiflexion and quad activation during gait progression; pt required assist to maintain LUE on RW during all mobility     Ambulation / Gait / Stairs / Wheelchair Mobility   Ambulation/Gait Ambulation/Gait assistance: Min assist, +2 physical assistance Gait Distance (Feet): 5 Feet(+ 8) Assistive device: Rolling walker (2 wheeled)(pt would benefit from hemiwalker) Gait Pattern/deviations: Step-to pattern, Decreased dorsiflexion - left, Decreased weight shift to left, Decreased weight shift to right General Gait Details: pt contineus to require assist to facilitate full wt shfit to R to assist in clearance for LLE due to foot drop, able to perform hip flexion to clear toes. L knee block during stance to prevent buckling, but frequently demos hyperextension without support/cues to avoid knee "snapping back" Gait velocity: reduced Gait velocity interpretation: <1.31 ft/sec, indicative of household ambulator     Posture / Balance Dynamic Sitting Balance Sitting balance - Comments: supervision for static sitting Balance Overall balance assessment: Needs assistance Sitting-balance support: Single extremity supported, Feet unsupported Sitting balance-Leahy Scale: Good Sitting balance - Comments: supervision for static sitting Standing balance support: Single extremity supported Standing balance-Leahy Scale: Poor Standing balance comment: min A for dynamic tasks at sink     Special needs/care consideration Skin: ecchymosis to right and left arm, surgical incision to head    Designated visitor: husband Darnell Level) and daughter [name])        Previous Home Environment (from acute therapy documentation) Living  Arrangements: Spouse/significant other, Children Available Help at Discharge: Family, Available 24 hours/day Type of Home: House Home Layout: Two level, Able to live on main  level with bedroom/bathroom Home Access: Stairs to enter Entrance Stairs-Rails: Right, Left Entrance Stairs-Number of Steps: 5 Bathroom Shower/Tub: Chiropodist: Standard Home Care Services: No Additional Comments: Pt has two adoptive daughters, and custody of her grand daughter ages 45,8, and 82.  She works as an Corporate treasurer with disabled adults    Discharge Living Setting Plans for Discharge Living Setting: Patient's home, Lives with (comment)(husband and 3 young children (5,8,12)) Type of Home at Discharge: House Discharge Home Layout: Two level, Full bath on main level, Able to live on main level with bedroom/bathroom Alternate Level Stairs-Rails: None Alternate Level Stairs-Number of Steps: NA Discharge Home Access: Stairs to enter Entrance Stairs-Rails: None(plans to have them installed) Entrance Stairs-Number of Steps: 3-4 Discharge Bathroom Shower/Tub: Tub/shower unit Discharge Bathroom Toilet: Standard Discharge Bathroom Accessibility: Yes How Accessible: Accessible via walker Does the patient have any problems obtaining your medications?: No   Social/Family/Support Systems Patient Roles: Spouse, Parent, Other (Comment)(full time employee (LPN)) Contact Information: spouse Darnell Level): [phone]; sister Erskine Squibb): [phone]; daughter [name] [phone]) Anticipated Caregiver: Manuela Schwartz during the day; husabnd works 9-5:30 job.  Anticipated Caregiver's Contact Information: see above  Ability/Limitations of Caregiver: Min A Caregiver Availability: 24/7 Discharge Plan Discussed with Primary Caregiver: Yes(with pt, her husband, her sister, and her daughter) Is Caregiver In Agreement with Plan?: Yes Does Caregiver/Family have Issues with Lodging/Transportation while Pt is in Rehab?: No     Goals Patient/Family Goal for Rehab: PT/OT: Supervision/Min A; SLP: NA Expected length of stay: 10-14 days Cultural Considerations: NA Pt/Family Agrees to Admission and  willing to participate: Yes Program Orientation Provided & Reviewed with Pt/Caregiver Including Roles  & Responsibilities: Yes(pt, her sister, her daughter, and her husband )  Barriers to Discharge: Home environment access/layout  Barriers to Discharge Comments: steps to enter home     Decrease burden of Care through IP rehab admission: NA     Possible need for SNF placement upon discharge:Not anticipated; pt has good support at DC from her family and they are willing and able to provide the anticipated Min A level as needed at DC. Pt is motivated to regain independence and return home quickly.      Patient Condition: This patient's medical and functional status has changed since the consult dated: 04/17/20 in which the Rehabilitation Physician determined and documented that the patient's condition is appropriate for intensive rehabilitative care in an inpatient rehabilitation facility. See "History of Present Illness" (above) for medical update. Functional changes are: decline from min A transfers to Min A +2, but progression in gait attempts from being deferred to Min A +2 for 5 feet. Patient's medical and functional status update has been discussed with the Rehabilitation physician and patient remains appropriate for inpatient rehabilitation. Will admit to inpatient rehab today.   Preadmission Screen Completed By:  Raechel Ache, OT, 04/21/2020 11:04 AM ______________________________________________________________________   Discussed status with Dr. Dagoberto Ligas on 04/21/20 at 11:02AM and received approval for admission today.   Admission Coordinator:  Raechel Ache, time 11:02AM/Date 04/21/20             Cosigned by: Courtney Heys, MD at 04/21/2020 11:25 AM  Revision History

## 2020-04-21 NOTE — Discharge Summary (Signed)
Physician Discharge Summary  [name] [name] LFY:[id number] DOB: 09-Apr-1956 DOA: 04/12/2020  PCP: Curlene Labrum, MD  Admit date: 04/12/2020 Discharge date: 04/21/2020  Admitted From: Home Disposition: Acute inpatient rehab  Recommendations for Outpatient Follow-up:  1. Schedule follow-up with neurology and neurosurgery on discharge.   Discharge Condition: Stable CODE STATUS: Full code Diet recommendation: Low-salt diet  Discharge summary: [name] [name] an 64 y.o.femalewith a PMHx of stroke, TIA, HLD, headache, carotid artery occlusion and craniotomy by Dr. Kathyrn Sheriff in 2014 for right frontal meningioma resection, who presented to [facility] on 5/30 after having had a seizurewitnessed by her husband on the night of 04/11/2020. She had [place] the [place] andhadwalked 9 miles prior to her seizure. She has never had a seizure before - she reports taking dilantin for 3 monthsafter her operation in 2014, withno antiepileptics since then. Shehas hadslight left-sided weakness that has been unchanged since her surgery in 2014.  She was admitted to neurosurgery service for new onset seizure with recurrent right frontal meningioma. She was started on IV Keppra and IV dexamethasone for mass-effect, and she underwent stereotactic right frontal craniotomy for resection of meningioma.  Seizure in the setting of recurrent meningioma: History of meningioma resection 2014 Repeat meningioma resection 04/16/2020 Continue Keppra for seizure prophylaxis, on dexamethasone taper for intracranial swelling.  Tapering as per neurosurgery. Developed left hemiparesis after surgery, also developed left focal seizure after surgery.  On increasing dose of Keppra. Continue to work with PT OT.  Further management as per neurosurgery. Refer to inpatient rehab.  Hypertension: Blood pressure stable.  Continue antihypertensives.  Hyperlipidemia: Stable.  On a statin.  Stable to transfer to inpatient  rehab.  She will need follow-up with neurosurgery and neurology.  Discharge Diagnoses:  Active Problems:   Seizure ([place])   Brain tumor ([place])   Dyslipidemia   Essential hypertension   History of tobacco abuse   History of meningioma   Leucocytosis   Acute blood loss anemia    Discharge Instructions  Discharge Instructions    Diet - low sodium heart healthy   Complete by: As directed    Increase activity slowly   Complete by: As directed    No dressing needed   Complete by: As directed      Allergies as of 04/21/2020   No Known Allergies     Medication List    STOP taking these medications   aspirin 81 MG tablet   ibuprofen 200 MG tablet Commonly known as: ADVIL     TAKE these medications   acetaminophen 325 MG tablet Commonly known as: TYLENOL Take 650 mg by mouth every 6 (six) hours as needed for moderate pain or headache.   atorvastatin 20 MG tablet Commonly known as: LIPITOR Take 20 mg by mouth daily.   dexamethasone 4 MG tablet Commonly known as: DECADRON Take 1 tablet (4 mg total) by mouth daily. Start taking on: April 22, 2020   levETIRAcetam 750 MG tablet Commonly known as: KEPPRA Take 2 tablets (1,500 mg total) by mouth 2 (two) times daily.   metoprolol succinate 25 MG 24 hr tablet Commonly known as: TOPROL-XL Take 12.5 mg by mouth daily.   OVER THE COUNTER MEDICATION Take 1 tablet by mouth daily. Vit B Stress Tablet   oxyCODONE 5 MG immediate release tablet Commonly known as: Oxy IR/ROXICODONE Take 1-2 tablets (5-10 mg total) by mouth every 3 (three) hours as needed for breakthrough pain.   pantoprazole 40 MG tablet Commonly known as: PROTONIX Take  1 tablet (40 mg total) by mouth daily with supper.   potassium chloride SA 20 MEQ tablet Commonly known as: KLOR-CON Take 20 mEq by mouth every other day.   Vitamin D3 50 MCG (2000 UT) Tabs Take 1 tablet by mouth daily.            Discharge Care Instructions  (From admission, onward)          Start     Ordered   04/21/20 0000  No dressing needed     04/21/20 1024          No Known Allergies  Consultations:  Neurosurgery  Neurology   Procedures/Studies: DG Chest 1 View  Result Date: 04/16/2020 CLINICAL DATA:  Central venous catheter placement EXAM: CHEST  1 VIEW COMPARISON:  04/12/2020 FINDINGS: Single frontal view of the chest demonstrates a left internal jugular central venous catheter tip overlying superior vena cava. Cardiac silhouette is unremarkable. No airspace disease, effusion, or pneumothorax. No acute bony abnormalities. IMPRESSION: 1. No complications after left internal jugular catheter placement. 2. No acute intrathoracic process. Electronically Signed   By: Randa Ngo M.D.   On: 04/16/2020 19:15   MR BRAIN W WO CONTRAST  Result Date: 04/17/2020 CLINICAL DATA:  Recurrent meningioma post resection EXAM: MRI HEAD WITHOUT AND WITH CONTRAST TECHNIQUE: Multiplanar, multiecho pulse sequences of the brain and surrounding structures were obtained without and with intravenous contrast. CONTRAST:  39mL GADAVIST GADOBUTROL 1 MMOL/ML IV SOLN COMPARISON:  04/13/2020 FINDINGS: Brain: Postoperative changes of right vertex craniotomy for resection of underlying meningioma. There is a thin underlying extra-axial collection of air, fluid, and blood products. Extra-axial air is present along the anterior right frontal convexity. Reduced diffusion in the underlying right frontoparietal lobes likely reflects a combination postoperative contusion/ischemia and blood products, noting subarachnoid and also probable parenchymal susceptibility. There has been significant resection of the recurrent meningioma. There is possible small volume residual enhancing nodular soft tissue along the right aspect falx on series 18, image 46 measuring approximately 1.4 x 0.6 cm axially. Possible additional residual tumor within the superior sagittal sinus with adjacent minimal contralateral  extension (series 18, image 50). There is mild edema. No significant mass effect. No hydrocephalus. Vascular: Major vessel flow voids at the skull base are preserved. Skull and upper cervical spine: Postoperative changes. Normal marrow signal is preserved. Sinuses/Orbits: Paranasal sinuses are aerated. Orbits are unremarkable. Other: Sella is unremarkable.  Mastoid air cells are clear. IMPRESSION: Postoperative changes of extensive resection of recurrent meningioma. Possible two small areas of residual tumor detailed above for which attention on follow-up is recommended Electronically Signed   By: Macy Mis M.D.   On: 04/17/2020 08:28   MR BRAIN W WO CONTRAST  Result Date: 04/13/2020 CLINICAL DATA:  Recurrent meningioma EXAM: MRI HEAD WITHOUT AND WITH CONTRAST TECHNIQUE: Multiplanar, multiecho pulse sequences of the brain and surrounding structures were obtained without and with intravenous contrast. CONTRAST:  73mL GADAVIST GADOBUTROL 1 MMOL/ML IV SOLN COMPARISON:  2016 FINDINGS: Brain: There are two areas of recurrent meningioma. The first along the right frontoparietal convexity measuring approximately 5.7 x 3.2 x 3.4 cm. Adjacent but probably discontiguous second area along the right aspect of the falx with some contralateral extension measures approximately 5.1 x 3.2 x 2.2 cm. There is compression and possible invasion of the adjacent superior sagittal sinus, which remains patent. Mild edema in the underlying frontoparietal white matter. Mild regional mass effect is present. There is no acute infarction or intracranial hemorrhage. Encephalomalacia in  the posterior right frontal lobe at the vertex related to prior resection. There is no hydrocephalus or extra-axial fluid collection. Vascular: Major vessel flow voids at the skull base are preserved. Skull and upper cervical spine: Prior right craniotomy. Normal marrow signal is preserved. Sinuses/Orbits: Paranasal sinuses are aerated. Orbits are  unremarkable. Other: Sella is unremarkable.  Mastoid air cells are clear. IMPRESSION: Recurrent meningioma along the right frontoparietal convexity and falx with some contralateral extension. Compression and possible invasion of the adjacent superior sagittal sinus, which remains patent. Mild underlying parenchymal edema and mass effect. Electronically Signed   By: Macy Mis M.D.   On: 04/13/2020 07:24   EEG adult  Result Date: 04/17/2020 Lora Havens, MD     04/17/2020  6:12 PM Patient Name: [name] [name] MRN: [id number] Epilepsy Attending: Lora Havens Referring Physician/Provider: Dr Kerney Elbe Date: 04/17/2020 Duration: 24.28 mins Patient history: 64 year old female with history of meningioma that was surgically removed in 2014, left hemiparesis, hypertension and hyperlipidemia presented from home with episode of seizure and confusion. EEG to evaluate for seizure Level of alertness: Awake AEDs during EEG study: Keppra, versed Technical aspects: This EEG study was done with scalp electrodes positioned according to the 10-20 International system of electrode placement. Electrical activity was acquired at a sampling rate of 500Hz  and reviewed with a high frequency filter of 70Hz  and a low frequency filter of 1Hz . EEG data were recorded continuously and digitally stored. Description: No clear posterior dominant rhythm was seen. EEG showed continuous generalized low amplitude 2-3Hz  delta slowing admixed with 15 to 18 Hz activity with irregular morphology distributed symmetrically and diffusely.  Hyperventilation and photic stimulation were not performed.   ABNORMALITY -Excessive beta, generalized -Continuous slow, generalized IMPRESSION: This study is suggestive of moderate diffuse encephalopathy, nonspecific etiology. The excessive beta activity seen in the background is most likely due to the effect of benzodiazepine and is a benign EEG pattern. No seizures or epileptiform discharges were seen  throughout the recording. Priyanka Barbra Sarks      Subjective: Patient seen and examined.  No overnight events.  She is excited to go to rehab.   Discharge Exam: Vitals:   04/21/20 0352 04/21/20 0800  BP: (!) 112/55 (!) 117/58  Pulse: 86 84  Resp: 16 18  Temp: 97.9 F (36.6 C) 98.7 F (37.1 C)  SpO2: 97% 100%   Vitals:   04/20/20 2020 04/20/20 2319 04/21/20 0352 04/21/20 0800  BP: (!) 102/58 100/60 (!) 112/55 (!) 117/58  Pulse: 91 74 86 84  Resp: 17 17 16 18   Temp: 98.6 F (37 C) 98.1 F (36.7 C) 97.9 F (36.6 C) 98.7 F (37.1 C)  TempSrc: Oral Oral Oral Oral  SpO2: 94% 99% 97% 100%  Weight:      Height:        General: Pt is alert, awake, not in acute distress.  Staples and incisional on the head intact and dry. Cardiovascular: RRR, S1/S2 +, no rubs, no gallops Respiratory: CTA bilaterally, no wheezing, no rhonchi Abdominal: Soft, NT, ND, bowel sounds + Extremities: no edema, no cyanosis Left upper and lower extremity power 3/5.    The results of significant diagnostics from this hospitalization (including imaging, microbiology, ancillary and laboratory) are listed below for reference.     Microbiology: Recent Results (from the past 240 hour(s))  Surgical pcr screen     Status: None   Collection Time: 04/15/20  6:26 AM   Specimen: Nasal Mucosa; Nasal Swab  Result Value  Ref Range Status   MRSA, PCR NEGATIVE NEGATIVE Final   Staphylococcus aureus NEGATIVE NEGATIVE Final    Comment: (NOTE) The Xpert SA Assay (FDA approved for NASAL specimens in patients 2 years of age and older), is one component of a comprehensive surveillance program. It is not intended to diagnose infection nor to guide or monitor treatment. Performed at [facility], [place] [address] [place], [place] [address]   SARS Coronavirus 2 by RT PCR (hospital order, performed in [facility] hospital lab) Nasopharyngeal Nasopharyngeal Swab     Status: None   Collection Time: 04/16/20  1:00  PM   Specimen: Nasopharyngeal Swab  Result Value Ref Range Status   SARS Coronavirus 2 NEGATIVE NEGATIVE Final    Comment: (NOTE) SARS-CoV-2 target nucleic acids are NOT DETECTED. The SARS-CoV-2 RNA is generally detectable in upper and lower respiratory specimens during the acute phase of infection. The lowest concentration of SARS-CoV-2 viral copies this assay can detect is 250 copies / mL. A negative result does not preclude SARS-CoV-2 infection and should not be used as the sole basis for treatment or other patient management decisions.  A negative result may occur with improper specimen collection / handling, submission of specimen other than nasopharyngeal swab, presence of viral mutation(s) within the areas targeted by this assay, and inadequate number of viral copies (<250 copies / mL). A negative result must be combined with clinical observations, patient history, and epidemiological information. Fact Sheet for Patients:   StrictlyIdeas.no Fact Sheet for Healthcare Providers: BankingDealers.co.za This test is not yet approved or cleared  by the [place] FDA and has been authorized for detection and/or diagnosis of SARS-CoV-2 by FDA under an Emergency Use Authorization (EUA).  This EUA will remain in effect (meaning this test can be used) for the duration of the COVID-19 declaration under Section 564(b)(1) of the Act, 21 U.S.C. section 360bbb-3(b)(1), unless the authorization is terminated or revoked sooner. Performed at [facility], [place] [address], [place],  [address]      Labs: BNP (last 3 results) No results for input(s): BNP in the last 8760 hours. Basic Metabolic Panel: Recent Labs  Lab 04/17/20 0315  NA 139  K 4.0  CL 106  CO2 24  GLUCOSE 183*  BUN 19  CREATININE 0.84  CALCIUM 7.9*   Liver Function Tests: No results for input(s): AST, ALT, ALKPHOS, BILITOT, PROT, ALBUMIN in the last 168  hours. No results for input(s): LIPASE, AMYLASE in the last 168 hours. No results for input(s): AMMONIA in the last 168 hours. CBC: Recent Labs  Lab 04/17/20 0315 04/20/20 0326  WBC 16.0* 12.5*  NEUTROABS  --  6.6  HGB 11.3* 10.7*  HCT 34.9* 33.7*  MCV 92.8 93.9  PLT 222 227   Cardiac Enzymes: No results for input(s): CKTOTAL, CKMB, CKMBINDEX, TROPONINI in the last 168 hours. BNP: Invalid input(s): POCBNP CBG: No results for input(s): GLUCAP in the last 168 hours. D-Dimer No results for input(s): DDIMER in the last 72 hours. Hgb A1c No results for input(s): HGBA1C in the last 72 hours. Lipid Profile No results for input(s): CHOL, HDL, LDLCALC, TRIG, CHOLHDL, LDLDIRECT in the last 72 hours. Thyroid function studies No results for input(s): TSH, T4TOTAL, T3FREE, THYROIDAB in the last 72 hours.  Invalid input(s): FREET3 Anemia work up No results for input(s): VITAMINB12, FOLATE, FERRITIN, TIBC, IRON, RETICCTPCT in the last 72 hours. Urinalysis No results found for: COLORURINE, APPEARANCEUR, [place], [place], [place], [place], [place], [place], [place],  UROBILINOGEN, NITRITE, LEUKOCYTESUR Sepsis Labs Invalid input(s): PROCALCITONIN,  WBC,  LACTICIDVEN Microbiology Recent Results (from the past 240 hour(s))  Surgical pcr screen     Status: None   Collection Time: 04/15/20  6:26 AM   Specimen: Nasal Mucosa; Nasal Swab  Result Value Ref Range Status   MRSA, PCR NEGATIVE NEGATIVE Final   Staphylococcus aureus NEGATIVE NEGATIVE Final    Comment: (NOTE) The Xpert SA Assay (FDA approved for NASAL specimens in patients 90 years of age and older), is one component of a comprehensive surveillance program. It is not intended to diagnose infection nor to guide or monitor treatment. Performed at [facility], [place] [address] [place], [place] [address]   SARS Coronavirus 2 by RT PCR (hospital order, performed in [facility] hospital lab) Nasopharyngeal Nasopharyngeal  Swab     Status: None   Collection Time: 04/16/20  1:00 PM   Specimen: Nasopharyngeal Swab  Result Value Ref Range Status   SARS Coronavirus 2 NEGATIVE NEGATIVE Final    Comment: (NOTE) SARS-CoV-2 target nucleic acids are NOT DETECTED. The SARS-CoV-2 RNA is generally detectable in upper and lower respiratory specimens during the acute phase of infection. The lowest concentration of SARS-CoV-2 viral copies this assay can detect is 250 copies / mL. A negative result does not preclude SARS-CoV-2 infection and should not be used as the sole basis for treatment or other patient management decisions.  A negative result may occur with improper specimen collection / handling, submission of specimen other than nasopharyngeal swab, presence of viral mutation(s) within the areas targeted by this assay, and inadequate number of viral copies (<250 copies / mL). A negative result must be combined with clinical observations, patient history, and epidemiological information. Fact Sheet for Patients:   StrictlyIdeas.no Fact Sheet for Healthcare Providers: BankingDealers.co.za This test is not yet approved or cleared  by the [place] FDA and has been authorized for detection and/or diagnosis of SARS-CoV-2 by FDA under an Emergency Use Authorization (EUA).  This EUA will remain in effect (meaning this test can be used) for the duration of the COVID-19 declaration under Section 564(b)(1) of the Act, 21 U.S.C. section 360bbb-3(b)(1), unless the authorization is terminated or revoked sooner. Performed at [facility], [place] [address] [place], [place] [address]      Time coordinating discharge:  32 minutes  SIGNED:   Barb Merino, MD  [facility] 04/21/2020, 10:24 AM

## 2020-04-21 NOTE — TOC Transition Note (Signed)
Transition of Care [facility]) - CM/SW Discharge Note   Patient Details  Name: [name] [name] MRN: [id number] Date of Birth: July 29, 1956  Transition of Care [facility]) CM/SW Contact:  Pollie Friar, RN Phone Number: 04/21/2020, 11:17 AM   Clinical Narrative:    Pt discharging to CIR today.  TOC signing off.    Final next level of care: IP Rehab Facility Barriers to Discharge: No Barriers Identified   Patient Goals and CMS Choice   CMS Medicare.gov Compare Post Acute Care list provided to:: Patient Choice offered to / list presented to : Patient  Discharge Placement                       Discharge Plan and Services   Discharge Planning Services: CM Consult Post Acute Care Choice: IP Rehab                               Social Determinants of Health (SDOH) Interventions     Readmission Risk Interventions No flowsheet data found.

## 2020-04-21 NOTE — Progress Notes (Signed)
Inpatient Rehabilitation  Patient information reviewed and entered into eRehab system by Ichelle Harral M. Milika Ventress, M.A., CCC/SLP, PPS Coordinator.  Information including medical coding, functional ability and quality indicators will be reviewed and updated through discharge.    

## 2020-04-21 NOTE — Plan of Care (Signed)
Supervision with eating.

## 2020-04-22 ENCOUNTER — Encounter (HOSPITAL_COMMUNITY): Payer: Self-pay | Admitting: Physical Medicine & Rehabilitation

## 2020-04-22 ENCOUNTER — Inpatient Hospital Stay (HOSPITAL_COMMUNITY): Payer: No Typology Code available for payment source | Admitting: Occupational Therapy

## 2020-04-22 ENCOUNTER — Inpatient Hospital Stay (HOSPITAL_COMMUNITY): Payer: No Typology Code available for payment source | Admitting: Physical Therapy

## 2020-04-22 ENCOUNTER — Other Ambulatory Visit: Payer: Self-pay

## 2020-04-22 LAB — CBC WITH DIFFERENTIAL/PLATELET
Abs Immature Granulocytes: 0.13 10*3/uL — ABNORMAL HIGH (ref 0.00–0.07)
Basophils Absolute: 0 10*3/uL (ref 0.0–0.1)
Basophils Relative: 0 %
Eosinophils Absolute: 0.1 10*3/uL (ref 0.0–0.5)
Eosinophils Relative: 1 %
HCT: 36.8 % (ref 36.0–46.0)
Hemoglobin: 11.8 g/dL — ABNORMAL LOW (ref 12.0–15.0)
Immature Granulocytes: 1 %
Lymphocytes Relative: 37 %
Lymphs Abs: 6 10*3/uL — ABNORMAL HIGH (ref 0.7–4.0)
MCH: 30.1 pg (ref 26.0–34.0)
MCHC: 32.1 g/dL (ref 30.0–36.0)
MCV: 93.9 fL (ref 80.0–100.0)
Monocytes Absolute: 1 10*3/uL (ref 0.1–1.0)
Monocytes Relative: 7 %
Neutro Abs: 8.7 10*3/uL — ABNORMAL HIGH (ref 1.7–7.7)
Neutrophils Relative %: 54 %
Platelets: 291 10*3/uL (ref 150–400)
RBC: 3.92 MIL/uL (ref 3.87–5.11)
RDW: 12.9 % (ref 11.5–15.5)
WBC: 16 10*3/uL — ABNORMAL HIGH (ref 4.0–10.5)
nRBC: 0 % (ref 0.0–0.2)

## 2020-04-22 LAB — COMPREHENSIVE METABOLIC PANEL
ALT: 37 U/L (ref 0–44)
AST: 20 U/L (ref 15–41)
Albumin: 3 g/dL — ABNORMAL LOW (ref 3.5–5.0)
Alkaline Phosphatase: 64 U/L (ref 38–126)
Anion gap: 9 (ref 5–15)
BUN: 17 mg/dL (ref 8–23)
CO2: 26 mmol/L (ref 22–32)
Calcium: 9 mg/dL (ref 8.9–10.3)
Chloride: 105 mmol/L (ref 98–111)
Creatinine, Ser: 0.76 mg/dL (ref 0.44–1.00)
GFR calc Af Amer: >60 mL/min (ref >60)
GFR calc non Af Amer: >60 mL/min (ref >60)
Glucose, Bld: 104 mg/dL — ABNORMAL HIGH (ref 70–99)
Potassium: 4.1 mmol/L (ref 3.5–5.1)
Sodium: 140 mmol/L (ref 135–145)
Total Bilirubin: 0.6 mg/dL (ref 0.3–1.2)
Total Protein: 5.9 g/dL — ABNORMAL LOW (ref 6.5–8.1)

## 2020-04-22 NOTE — Progress Notes (Signed)
Occupational Therapy Assessment and Plan  Patient Details  Name: [name] [name] MRN: [id number] Date of Birth: Mar 25, 1956  OT Diagnosis: disturbance of vision, hemiplegia affecting non-dominant side and muscle weakness (generalized) Rehab Potential: Rehab Potential (ACUTE ONLY): Good ELOS: 14-16 days   Today's Date: 04/22/2020 OT Individual Time: 3267-1245 OT Individual Time Calculation (min): 67 min     Problem List:  Patient Active Problem List   Diagnosis Date Noted   Meningioma ([place]) 04/21/2020   Left hemiparesis ([place]) 04/21/2020   Dyslipidemia    Essential hypertension    History of tobacco abuse    History of meningioma    Leucocytosis    Acute blood loss anemia    Brain tumor ([place]) 04/13/2020   Seizure ([place]) 04/12/2020   Occlusion and stenosis of carotid artery without mention of cerebral infarction 08/10/2012   Fibromuscular dysplasia ([place]) 08/10/2012    Past Medical History:  Past Medical History:  Diagnosis Date   Bruises easily    Carotid artery occlusion    Dizziness    Headache(784.0)    Hyperlipidemia    takes Atorvastatin daily   PONV (postoperative nausea and vomiting)    Stroke ([facility])    TIA (transient ischemic attack)    Past Surgical History:  Past Surgical History:  Procedure Laterality Date   ABDOMINAL HYSTERECTOMY  8099   APPLICATION OF CRANIAL NAVIGATION N/A 04/16/2020   Procedure: APPLICATION OF CRANIAL NAVIGATION;  Surgeon: Consuella Lose, MD;  Location: [facility];  Service: Neurosurgery;  Laterality: N/A;   COLONOSCOPY     CRANIOTOMY Right 09/10/2013   Procedure: Craniotomy for tumor excision;  Surgeon: Consuella Lose, MD;  Location: [facility];  Service: Neurosurgery;  Laterality: Right;  Craniotomy for meningioma with stealth   CRANIOTOMY N/A 04/16/2020   Procedure: STEREOTACTIC CRANIOTOMY FOR RESECTION OF MENINGIOMA;  Surgeon: Consuella Lose, MD;  Location: [facility];  Service: Neurosurgery;  Laterality: N/A;    DILATION AND CURETTAGE OF UTERUS  1980   left hand surgery  2007 and 2008   plates and screws    Assessment & Plan Clinical Impression: Patient is a 64 y.o. year old female with recent admission to the hospital on 04/12/20 with meningioma secondary to left hemiparesis. Pt is a 64 year old right-handed female with history of hyperlipidemia, hypertension, history of tobacco abuse, craniotomy for tumor excision/meningioma 09/10/2013. Lives with husband. Independent working as a Marine scientist. Two-level home bed and bath on main level 5 steps to entry and good family support. Presented 04/12/2020 with left hemiparesis and seizure. Patient underwent stereotactic right frontal craniotomy resection of meningioma 04/16/2020. Patient transferred to CIR on 04/21/2020 .    Patient currently requires max with basic self-care skills secondary to muscle weakness, impaired timing and sequencing, unbalanced muscle activation and decreased coordination, decreased visual perceptual skills and decreased standing balance, decreased postural control, hemiplegia and decreased balance strategies.  Prior to hospitalization, patient could complete ADLs and IADLs with independent .  Patient will benefit from skilled intervention to increase independence with basic self-care skills and increase level of independence with iADL prior to discharge home with care partner.  Anticipate patient will require intermittent supervision and follow up outpatient.  OT - End of Session Activity Tolerance: Tolerates 30+ min activity with multiple rests Endurance Deficit: Yes Endurance Deficit Description: requires seated rest breaks for endurance OT Assessment Rehab Potential (ACUTE ONLY): Good OT Patient demonstrates impairments in the following area(s): Balance;Motor;Endurance;Sensory;Vision OT Basic ADL's Functional Problem(s): Eating;Grooming;Bathing;Dressing;Toileting OT Advanced ADL's Functional Problem(s): Simple Meal  Preparation;Laundry  OT Transfers Functional Problem(s): Toilet;Tub/Shower OT Additional Impairment(s): Fuctional Use of Upper Extremity OT Plan OT Intensity: Minimum of 1-2 x/day, 45 to 90 minutes OT Frequency: 5 out of 7 days OT Duration/Estimated Length of Stay: 14-16 days OT Treatment/Interventions: Balance/vestibular training;Neuromuscular re-education;Self Care/advanced ADL retraining;UE/LE Coordination activities;Therapeutic Activities;Functional mobility training;Discharge planning;Community reintegration;Patient/family education;UE/LE Strength taining/ROM;DME/adaptive equipment instruction;Therapeutic Exercise;Pain management;Visual/perceptual remediation/compensation;Functional electrical stimulation;Psychosocial support OT Self Feeding Anticipated Outcome(s): set up OT Basic Self-Care Anticipated Outcome(s): supervision OT Toileting Anticipated Outcome(s): supervision OT Bathroom Transfers Anticipated Outcome(s): supervision OT Recommendation Patient destination: Home Follow Up Recommendations: Outpatient OT Equipment Recommended: None recommended by OT Equipment Details: pt states having tub bench from previous surgery   Skilled Therapeutic Intervention  Evaluation with focus on ADL retraining and functional transfers. Pt received semi-reclined in bed. Completed functional squat pivot transfers with minA overall due hemiparesis. Pt demonstrates strong sequencing and awareness of deficits. Completed sit to stands at EOB and at w/c to increase independence of sit to stand level ADL tasks, pt required minA due to buckling of LLE. Attempted alternating weight shifting to assess readiness for ambulation, pt demonstrated decreased balance due to hemiparesis, therefore utilized w/c for transfer. Showered with tub transfer bench with modA due to L hemiparesis. Completed dressing with maxA due to L hemiparesis while educating on use of hemi dressing techniques. Pt would benefit from continued  education on hemi techniques for bathing and dressing. Ended session with pt semi-reclined in bed, bed alarm on and all needs within reach.  OT Evaluation Precautions/Restrictions  Precautions Precautions: Fall Restrictions Weight Bearing Restrictions: No   Pain Pain Assessment Pain Scale: 0-10 Pain Score: 0-No pain Home Living/Prior Functioning Home Living Family/patient expects to be discharged to:: Private residence Living Arrangements: Spouse/significant other Available Help at Discharge: Family, Available 24 hours/day Type of Home: House Home Access: Stairs to enter [employer] of Steps: 4 Entrance Stairs-Rails: Right, Left(husband currently installing) Home Layout: Two level, Able to live on main level with bedroom/bathroom Bathroom Shower/Tub: Chiropodist: Standard Bathroom Accessibility: Yes Additional Comments: Pt has 2 daughters and a grandaughter 12, 8, and 5. Works as an Corporate treasurer and cares for the [employer] With: Spouse, Family IADL History Homemaking Responsibilities: Yes Meal Prep Responsibility: Therapist, occupational Responsibility: Primary Cleaning Responsibility: Primary Shopping Responsibility: Primary Child Care Responsibility: Primary Current License: Yes Mode of Transportation: Car Occupation: Full time employment Type of Occupation: LPN Prior Function Level of Independence: Independent with basic ADLs, Independent with homemaking with ambulation  Able to Take Stairs?: Yes Driving: Yes Vocation: Full time employment Comments: 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 ADL ADL Eating: Not assessed Upper Body Bathing: Moderate assistance Where Assessed-Upper Body Bathing: Shower(tub transfer bench) Lower Body Bathing: Moderate assistance Where Assessed-Lower Body Bathing: Shower(tub transfer bench) Upper Body Dressing: Maximal assistance Where Assessed-Upper Body Dressing: Wheelchair Lower Body  Dressing: Maximal assistance Where Assessed-Lower Body Dressing: Wheelchair(sit to stand level) Toileting: Not assessed Toilet Transfer: Not assessed Tub/Shower Transfer: Minimal assistance Social research officer, government: Minimal assistance Social research officer, government Method: Education officer, environmental: Radio broadcast assistant, Grab bars Vision Baseline Vision/History: Wears glasses Patient Visual Report: Blurring of vision(reported occasional blurring of vision that was not present at PLOF; some L inattention) Vision Assessment?: Vision impaired- to be further tested in functional context Additional Comments: Pt reports blurriness post surgery, however improving.  Will need to continue to assess during functional tasks   Cognition Overall Cognitive Status: Within Functional Limits for tasks assessed Arousal/Alertness: Awake/alert Orientation  Level: Person;Place;Situation Person: Oriented Place: Oriented Situation: Oriented Year: 2021 Month: June Day of Week: Correct Memory: Appears intact Immediate Memory Recall: Sock;Blue;Bed Memory Recall Sock: With Cue Memory Recall Blue: Without Cue Memory Recall Bed: Without Cue Attention: Alternating;Divided Divided Attention: Appears intact Awareness: Appears intact Safety/Judgment: Appears intact Sensation Sensation Light Touch: Impaired by gross assessment(decreased sensation in Lt hand along ulnar distribution) Proprioception: Appears Intact Coordination Gross Motor Movements are Fluid and Coordinated: No(due to L hemplegia) Fine Motor Movements are Fluid and Coordinated: No(due to L hemiplegia) Coordination and Movement Description: dense L sided Hemiplegia UE>LE Finger Nose Finger Test: [facility] for RUE. unable to perform due to elbow/wrist weakness Heel Shin Test: limited due to strength deficits as well as mild ataxia Extremity/Trunk Assessment RUE Assessment RUE Assessment: Within Functional Limits General Strength Comments: 4/5  elbow flexion/extension, shoulder ab/add and flexion LUE Assessment LUE Assessment: Exceptions to [facility] Passive Range of Motion (PROM) Comments: WNL Active Range of Motion (AROM) Comments: ~80 degrees of shoulder abduction, ~60 shoulder flexion General Strength Comments: 3+/5 in bicep, tricep, delt, 0/5 in wrist and fingers/grasp  Refer to Care Plan for Long Term Goals  Recommendations for other services: None    Discharge Criteria: Patient will be discharged from OT if patient refuses treatment 3 consecutive times without medical reason, if treatment goals not met, if there is a change in medical status, if patient makes no progress towards goals or if patient is discharged from hospital.  The above assessment, treatment plan, treatment alternatives and goals were discussed and mutually agreed upon: by patient  Michelle Nasuti 04/22/2020, 12:38 PM

## 2020-04-22 NOTE — Plan of Care (Signed)
  Problem: Consults Goal: RH BRAIN INJURY PATIENT EDUCATION Description: Description: See Patient Education module for eduction specifics Outcome: Progressing Goal: Skin Care Protocol Initiated - if Braden Score 18 or less Description: If consults are not indicated, leave blank or document N/A Outcome: Progressing Goal: Nutrition Consult-if indicated Outcome: Progressing   Problem: RH BOWEL ELIMINATION Goal: RH STG MANAGE BOWEL WITH ASSISTANCE Description: STG Manage Bowel with mod I  Assistance. Outcome: Progressing Goal: RH STG MANAGE BOWEL W/MEDICATION W/ASSISTANCE Description: STG Manage Bowel with Medication with mod I Assistance. Outcome: Progressing   Problem: RH BLADDER ELIMINATION Goal: RH STG MANAGE BLADDER WITH ASSISTANCE Description: STG Manage Bladder With no Assistance Outcome: Progressing   Problem: RH SKIN INTEGRITY Goal: RH STG ABLE TO PERFORM INCISION/WOUND CARE W/ASSISTANCE Description: STG Able To Perform Incision/Wound Care With  min Assistance. Outcome: Progressing   Problem: RH SAFETY Goal: RH STG ADHERE TO SAFETY PRECAUTIONS W/ASSISTANCE/DEVICE Description: STG Adhere to Safety Precautions With supervision Assistance/Device. Outcome: Progressing Goal: RH STG DECREASED RISK OF FALL WITH ASSISTANCE Description: STG Decreased Risk of Fall With  supervision Assistance. Outcome: Progressing   Problem: RH COGNITION-NURSING Goal: RH STG ANTICIPATES NEEDS/CALLS FOR ASSIST W/ASSIST/CUES Description: STG Anticipates Needs/Calls for Assist With  supervision Assistance/Cues. Outcome: Progressing   Problem: RH PAIN MANAGEMENT Goal: RH STG PAIN MANAGED AT OR BELOW PT'S PAIN GOAL Description: At or below level 3 Outcome: Progressing   Problem: RH KNOWLEDGE DEFICIT BRAIN INJURY Goal: RH STG INCREASE KNOWLEDGE OF SELF CARE AFTER BRAIN INJURY Description: Patient and spouse will be able to manage care at discharge independently using handouts/resources as  references Outcome: Progressing

## 2020-04-22 NOTE — Progress Notes (Signed)
Occupational Therapy Session Note  Patient Details  Name: [name] [name] MRN: [id number] Date of Birth: [birth year]  Today's Date: 04/22/2020 OT Individual Time: 4132-4401 OT Individual Time Calculation (min): 46 min    Short Term Goals: Week 1:  OT Short Term Goal 1 (Week 1): Pt will dress UB with use of hemi techniques with modA. OT Short Term Goal 2 (Week 1): Pt will complete LB dressing with modA at sit to stand level, demonstrating improved dynamic standing balance. OT Short Term Goal 3 (Week 1): Pt will complete toilet transfer with minA with LRAD.  Skilled Therapeutic Interventions/Progress Updates:  Pt greeted at time of session sitting up in wheelchair agreeable to OT session to focus on toileting and NMR of LUE. Pt set up at toilet with BSC over toilet and performed sit to stand and stand pivot transfer with Min-Mod A and Min A to don/doff LB clothing, able to perform her own hygiene. Note that pt able to stand without UE support briefly to assist with donning/doffing clothing. Husband in the room and educated on role and purpose of OT regarding toileting and pt's skill performance. Pt brought to gym, participated in standing weight bearing through [place] at table top height with lateral weight shifting, cues for bearing weight through LLE as well and decreasing knee flexion in standing. Functional reaching task for LUE while crossing midline for cups to place on contralateral side of the body with therapist assist more distally given her more proximal shoulder AROM. Pt had most difficulty with grasp/release of digits for holding items. Reviewed AAROM for her LUE for the pt to perform in the room to improve ROM/functional use and decrease any stiffness. Pt left up in wheelchair alarm on and all needs met, husband in the room.    Therapy Documentation Precautions:  Precautions Precautions: Fall Restrictions Weight Bearing Restrictions: No Pain: Pain Assessment Pain Score: 0-No  pain   Therapy/Group: Individual Therapy  Viona Gilmore 04/22/2020, 3:50 PM

## 2020-04-22 NOTE — Progress Notes (Signed)
Physical Therapy Session Note  Patient Details  Name: [name] [name] MRN: [id number] Date of Birth: 1956/02/19  Today's Date: 04/22/2020 PT Individual Time: 1655-1705 PT Individual Time Calculation (min): 10 min   Short Term Goals: Week 1:  PT Short Term Goal 1 (Week 1): Pt will perform bed mobility with supervision assist PT Short Term Goal 2 (Week 1): Pt will transfer to and from [facility] with min assist consistently PT Short Term Goal 3 (Week 1): Pt will ambulate 28f with min assist and LRAD PT Short Term Goal 4 (Week 1): pt will ascend 4 steps with 1 rail and mod assist  Skilled Therapeutic Interventions/Progress Updates:   [facility] to [facility]. PT removed DF wrap from LLE. Stand pivot transfer to bed with mod assist to the block the LLE. Sit>supine with min assist for improved control and awareness of the LUE/LLE. Scooting to EOB in supine with min assist to stabilize the LLE. Pt left supine in bed with call bell in reach and all needs met.       Therapy Documentation Precautions:  Precautions Precautions: Fall Restrictions Weight Bearing Restrictions: No Vital Signs: Therapy Vitals Temp: 98.2 F (36.8 C) Pulse Rate: 97 Resp: 18 BP: 115/63 Oxygen Therapy SpO2: 100 %      Therapy/Group: Individual Therapy  ALorie Phenix6/07/2020, 5:30 PM

## 2020-04-22 NOTE — Evaluation (Signed)
Physical Therapy Assessment and Plan  Patient Details  Name: [name] [name] MRN: [id number] Date of Birth: [birth year]  PT Diagnosis: Abnormal posture, Abnormality of gait, Ataxia, Ataxic gait, Coordination disorder, Hemiplegia non-dominant and Muscle weakness Rehab Potential: Good ELOS: 14-17 days   Today's Date: 04/22/2020 PT Individual Time: 2536-6440 PT Individual Time Calculation (min): 88 min    Problem List:  Patient Active Problem List   Diagnosis Date Noted  . Meningioma ([place]) 04/21/2020  . Left hemiparesis ([place]) 04/21/2020  . Dyslipidemia   . Essential hypertension   . History of tobacco abuse   . History of meningioma   . Leucocytosis   . Acute blood loss anemia   . Brain tumor ([place]) 04/13/2020  . Seizure ([place]) 04/12/2020  . Occlusion and stenosis of carotid artery without mention of cerebral infarction 08/10/2012  . Fibromuscular dysplasia ([place]) 08/10/2012    Past Medical History:  Past Medical History:  Diagnosis Date  . Bruises easily   . Carotid artery occlusion   . Dizziness   . Headache(784.0)   . Hyperlipidemia    takes Atorvastatin daily  . PONV (postoperative nausea and vomiting)   . Stroke ([place])   . TIA (transient ischemic attack)    Past Surgical History:  Past Surgical History:  Procedure Laterality Date  . ABDOMINAL HYSTERECTOMY  2002  . APPLICATION OF CRANIAL NAVIGATION N/A 04/16/2020   Procedure: APPLICATION OF CRANIAL NAVIGATION;  Surgeon: [name] Lose, MD;  Location: [facility];  Service: Neurosurgery;  Laterality: N/A;  . COLONOSCOPY    . CRANIOTOMY Right 09/10/2013   Procedure: Craniotomy for tumor excision;  Surgeon: [name] Lose, MD;  Location: [facility];  Service: Neurosurgery;  Laterality: Right;  Craniotomy for meningioma with stealth  . CRANIOTOMY N/A 04/16/2020   Procedure: STEREOTACTIC CRANIOTOMY FOR RESECTION OF MENINGIOMA;  Surgeon: [name] Lose, MD;  Location: [facility];  Service: Neurosurgery;  Laterality: N/A;  .  DILATION AND CURETTAGE OF UTERUS  1980  . left hand surgery  2007 and 2008   plates and screws    Assessment & Plan Clinical Impression: Patient is a  64 year old right-handed female with history of hyperlipidemia, hypertension, history of tobacco abuse, craniotomy for tumor excision/meningioma 09/10/2013.  Per chart review lives with husband.  Independent working as a Marine scientist.  Two-level home bed and bath on main level 5 steps to entry and good family support.  Presented 04/12/2020 with left hemiparesis and seizure.  Patient on no antiepileptic medication prior to admission.  Admission chemistries unremarkable except glucose mildly elevated 139.  EEG identified no seizure.  MRI showed recurrent meningioma along the right frontoparietal convexity and falx with some contralateral extension.  Compression and possible invasion of the adjacent superior sagittal sinus which remain patent.  Patient underwent stereotactic right frontal craniotomy resection of meningioma 04/16/2020 per Dr. Kathyrn [name].    Patient transferred to CIR on 04/21/2020 .   Patient currently requires max with mobility secondary to muscle weakness, muscle joint tightness and muscle paralysis, decreased cardiorespiratoy endurance, impaired timing and sequencing, unbalanced muscle activation, ataxia and decreased coordination and decreased sitting balance, decreased standing balance, decreased postural control, hemiplegia and decreased balance strategies.  Prior to hospitalization, patient was independent  with mobility and lived with Spouse, Family in a House home.  Home access is 4Stairs to enter.  Patient will benefit from skilled PT intervention to maximize safe functional mobility, minimize fall risk and decrease caregiver burden for planned discharge home with 24 hour supervision.  Anticipate patient will benefit  from follow up [place] at discharge.  PT - End of Session Endurance Deficit: Yes PT Assessment Rehab Potential (ACUTE/IP ONLY):  Good PT Barriers to Discharge: Inaccessible home environment;Home environment access/layout;Medical stability;Pending chemo/radiation PT Patient demonstrates impairments in the following area(s): Balance;Edema;Endurance;Motor;Nutrition;Pain;Safety PT Transfers Functional Problem(s): Bed Mobility;Bed to Chair;Car;Furniture;Floor PT Locomotion Functional Problem(s): Ambulation;Wheelchair Mobility;Stairs PT Plan PT Intensity: Minimum of 1-2 x/day ,45 to 90 minutes PT Frequency: 5 out of 7 days PT Duration Estimated Length of Stay: 14-17 days PT Treatment/Interventions: Ambulation/gait training;Balance/vestibular training;Cognitive remediation/compensation;Community reintegration;Discharge planning;Disease management/prevention;DME/adaptive equipment instruction;Functional electrical stimulation;Functional mobility training;Neuromuscular re-education;Pain management;Patient/family education;Psychosocial support;Skin care/wound management;Splinting/orthotics;Stair training;Therapeutic Activities;Therapeutic Exercise;UE/LE Strength taining/ROM;UE/LE Coordination activities;Visual/perceptual remediation/compensation;Wheelchair propulsion/positioning PT Transfers Anticipated Outcome(s): Supervision assist with LRAD PT Locomotion Anticipated Outcome(s): ambulatory with supevision assist and LRAD PT Recommendation Recommendations for Other Services: Therapeutic Recreation consult Therapeutic Recreation Interventions: Stress management;Outing/community reintergration Follow Up Recommendations: Home health PT Patient destination: Home Equipment Recommended: Rolling walker with 5" wheels  Skilled Therapeutic Intervention Pt received supine in bed and agreeable to PT. Supine>sit transfer with min assist for LUE management and cues for improved pelvic control. PT instructed patient in PT Evaluation and initiated treatment intervention; see below for results. PT educated patient in [place], rehab potential, rehab  goals, and discharge recommendations. Sit<>stand with min assist. Mod assist stand pivot transfer to and from [facility] x 4 throughout treatment. Gait training without AD x 92f with max assist to stabilize the LLE and prevent foot drag. PT applied DF wrap and pt performed additional gait training with RW and mod assist [id number]. Car transfer training with max assist for safety and LLE management to prevent knee collapse. PT instructed pt in TUG: 87 sec (average of 3 trials; >13.5 sec indicates increased fall risk). Pt performed 5xSTS: 23 sec (>15 sec indicates increased fall risk). Patient returned to room and left sitting in [facility] in reach and all needs met.          PT Evaluation Precautions/Restrictions Precautions Precautions: Fall Restrictions Weight Bearing Restrictions: No General   Vital Signs  Pain Pain Assessment Pain Scale: 0-10 Home Living/Prior Functioning Home Living Available Help at Discharge: Family;Available 24 hours/day Type of Home: House Home Access: Stairs to enter [employer] Steps: 4 Entrance Stairs-Rails: Right;Left Home Layout: Two level;Able to live on main level with bedroom/bathroom Bathroom Shower/Tub: TuChiropodistStandard Bathroom Accessibility: Yes Additional Comments: Pt has 2 daughters and a grandaughter 12, 8, and 5. 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 R. 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 Vision/Perception    WFL Cognition Overall Cognitive Status: Within Functional Limits for tasks assessed Arousal/Alertness: Awake/alert Memory: Appears intact Immediate Memory Recall: Sock;Blue;Bed Memory Recall Sock: With Cue Memory Recall Blue: Without Cue Memory  Recall Bed: Without Cue Safety/Judgment: Appears intact Sensation Sensation Light Touch: Appears Intact Coordination Gross Motor Movements are Fluid and Coordinated: No(due to L hemplegia) Fine Motor Movements are Fluid and Coordinated: No(due to L hemiplegia) Coordination and Movement Description: dense L sided Hemiplegia UE>LE Finger Nose Finger Test: [facility] Centeror RUE. unable to perform due to elbow/wrist weakness Heel Shin Test: limited due to strength deficits as well as mild ataxia Motor  Motor Motor: Hemiplegia;Ataxia Motor - Skilled Clinical Observations: Lsided ataxia/hemiplegia UE>LE.  Mobility Bed Mobility Bed Mobility: Rolling Right;Rolling  Left;Supine to Sit;Sit to Supine Rolling Right: Minimal Assistance - Patient > 75% Rolling Left: Minimal Assistance - Patient > 75% Supine to Sit: Minimal Assistance - Patient > 75% Sit to Supine: Minimal Assistance - Patient > 75% Transfers Transfers: Sit to Stand;Stand to Sit Sit to Stand: Minimal Assistance - Patient > 75% Stand to Sit: Moderate Assistance - Patient 50-74% Transfer (Assistive device): None Locomotion  Gait Ambulation: Yes Gait Assistance: Maximal Assistance - Patient 25-49% Gait Distance (Feet): 25 Feet Assistive device: None;1 person hand held assist Gait Gait: Yes Gait Pattern: Impaired Gait Pattern: Step-to pattern;Ataxic;Lateral hip instability;Left steppage;Poor foot clearance - left;Left circumduction;Left hip hike Stairs / Additional Locomotion Stairs: Yes Stairs Assistance: Maximal Assistance - Patient 25 - 49% Stair Management Technique: One rail Right Number of Stairs: 6 Wheelchair Mobility Wheelchair Mobility: Yes Wheelchair Assistance: Moderate Assistance - Patient 50 - 74% Wheelchair Propulsion: Both upper extremities Wheelchair Parts Management: Needs assistance Distance: 150  Trunk/Postural Assessment  Cervical Assessment Cervical Assessment: Within Functional Limits Thoracic  Assessment Thoracic Assessment: Within Functional Limits Lumbar Assessment Lumbar Assessment: Within Functional Limits Postural Control Postural Control: Deficits on evaluation Postural Limitations: mild L lateral lean. able to correct with cues.  Balance Balance Balance Assessed: Yes Static Sitting Balance Static Sitting - Balance Support: No upper extremity supported Static Sitting - Level of Assistance: 5: Stand by assistance Dynamic Sitting Balance Dynamic Sitting - Balance Support: Right upper extremity supported Dynamic Sitting - Level of Assistance: 5: Stand by assistance Static Standing Balance Static Standing - Balance Support: Right upper extremity supported Static Standing - Level of Assistance: 4: Min assist Dynamic Standing Balance Dynamic Standing - Balance Support: No upper extremity supported;During functional activity Dynamic Standing - Level of Assistance: 3: Mod assist;2: Max assist Extremity Assessment  RUE Assessment RUE Assessment: Within Functional Limits General Strength Comments: 4/5 elbow flexion/extension, shoulder ab/add and flexion LUE Assessment LUE Assessment: Exceptions to [facility] Active Range of Motion (AROM) Comments: ~80 degrees of shoulder abduction, ~60 shoulder flexion General Strength Comments: 3+ overall for LUE RLE Assessment RLE Assessment: Within Functional Limits LLE Assessment LLE Assessment: Exceptions to [facility] General Strength Comments: grossly 4-/5 hip and knee with delayed activaiton. 0/5 DF.    Refer to Care Plan for Long Term Goals  Recommendations for other services: Therapeutic Recreation  Stress management and Outing/community reintegration  Discharge Criteria: Patient will be discharged from PT if patient refuses treatment 3 consecutive times without medical reason, if treatment goals not met, if there is a change in medical status, if patient makes no progress towards goals or if patient is discharged from hospital.  The  above assessment, treatment plan, treatment alternatives and goals were discussed and mutually agreed upon: by patient  Lorie Phenix 04/22/2020, 12:05 PM

## 2020-04-22 NOTE — Care Management (Signed)
Patient ID: [name] [name], female   DOB: 1956/08/28, 64 y.o.   MRN: [id number] Met with patient and spouse to review CM role and collaboration with SW Margreta Journey) to assure smooth discharge transition. Reviewed risk factors for secondary stroke previously addressed and no questions or concerns noted at present. Will continue to follow along with rehabilitation and address nursing concerns as needed.

## 2020-04-22 NOTE — Progress Notes (Signed)
[place] PHYSICAL MEDICINE & REHABILITATION PROGRESS NOTE   Subjective/Complaints:  Felt good taking shower this am   Objective:   No results found. Recent Labs    04/21/20 1758 04/22/20 0532  WBC 13.6* 16.0*  HGB 12.0 11.8*  HCT 36.6 36.8  PLT 300 291   Recent Labs    04/21/20 1758 04/22/20 0532  NA  --  140  K  --  4.1  CL  --  105  CO2  --  26  GLUCOSE  --  104*  BUN  --  17  CREATININE 0.78 0.76  CALCIUM  --  9.0    Intake/Output Summary (Last 24 hours) at 04/22/2020 0930 Last data filed at 04/22/2020 0700 Gross per 24 hour  Intake 240 ml  Output 1050 ml  Net -810 ml     Physical Exam: Vital Signs Blood pressure 107/64, pulse 79, temperature 98.5 F (36.9 C), temperature source Oral, resp. rate 16, height 5\' 3"  (1.6 m), weight 74.5 kg, SpO2 96 %.   General: No acute distress Mood and affect are appropriate Heart: Regular rate and rhythm no rubs murmurs or extra sounds Lungs: Clear to auscultation, breathing unlabored, no rales or wheezes Abdomen: Positive bowel sounds, soft nontender to palpation, nondistended Extremities: No clubbing, cyanosis, or edema Skin: No evidence of breakdown, no evidence of rash, crani wound CDI, Telfa dressing stapled into scalp  Neurologic: Cranial nerves II through XII intact, motor strength is 5/5 in right  deltoid, bicep, tricep, grip, hip flexor, knee extensors, ankle dorsiflexor and plantar flexor 3- left delt , 2- L bi/tri, 3- Left KE, HF, 0/5 L ankle DF   Cerebellar exam cannot perform Left finger to nose to finger as well as heel to shindue to weakness Musculoskeletal: Full range of motion in all 4 extremities. No joint swelling   Assessment/Plan: 1. Functional deficits secondary to Right frontal meningioma  which require 3+ hours per day of interdisciplinary therapy in a comprehensive inpatient rehab setting.  Physiatrist is providing close team supervision and 24 hour management of active medical problems listed  below.  Physiatrist and rehab team continue to assess barriers to discharge/monitor patient progress toward functional and medical goals  Care Tool:  Bathing              Bathing assist       Upper Body Dressing/Undressing Upper body dressing        Upper body assist      Lower Body Dressing/Undressing Lower body dressing            Lower body assist       Toileting Toileting    Toileting assist Assist for toileting: Moderate Assistance - Patient 50 - 74%     Transfers Chair/bed transfer  Transfers assist           Locomotion Ambulation   Ambulation assist              Walk 10 feet activity   Assist           Walk 50 feet activity   Assist           Walk 150 feet activity   Assist           Walk 10 feet on uneven surface  activity   Assist           Wheelchair     Assist               Wheelchair  50 feet with 2 turns activity    Assist            Wheelchair 150 feet activity     Assist          Blood pressure 107/64, pulse 79, temperature 98.5 F (36.9 C), temperature source Oral, resp. rate 16, height 5\' 3"  (1.6 m), weight 74.5 kg, SpO2 96 %.  Medical Problem List and Plan: 1.  Left side weakness secondary to recurrent meningioma status post right frontal craniotomy resection 04/16/2020- previous craniotomy for meningioma was 2014             -patient may shower if uses shower cap until staples out              -ELOS/Goals: 2-2.5 weeks- goals Mod I to supervision 2.  Antithrombotics: -DVT/anticoagulation: Subcutaneous heparin initiated 04/19/2020             -antiplatelet therapy: N/A 3. Pain Management: Oxycodone as needed 4. Mood: Provide emotional support             -antipsychotic agents: N/A 5. Neuropsych: This patient is capable of making decisions on her own behalf. 6. Skin/Wound Care: Routine skin checks, should be ready for staple removal next week  7.  Fluids/Electrolytes/Nutrition: Routine in and outs with follow-up chemistries 8.  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  Keppra 1500 mg twice daily 9.  Hypertension.  Toprol 12.5 mg daily.  Monitor with increased mobility 10.  Constipation.  Currently on MiraLAX as needed.  Adjust bowel program as needed 11.  Hyperlipidemia.  Lipitor 12.  History of tobacco use.  Provide counseling 13. Previous craniotomy with mild L hemiparesis since 2014 for meningioma resection    LOS: 1 days A FACE TO FACE EVALUATION WAS PERFORMED  [name] [name] 04/22/2020, 9:30 AM

## 2020-04-22 NOTE — Progress Notes (Signed)
Patient ID: [name] [name], female   DOB: 1956/03/12, 64 y.o.   MRN: [id number]   Team Conference Report to Patient/Family  Team Conference discussion was reviewed with the patient and caregiver, including goals, any changes in plan of care and target discharge date.  Patient and caregiver express understanding and are in agreement.  The patient has a target discharge date of (two week ELOS).  Dyanne Iha 04/22/2020, 1:29 PM

## 2020-04-22 NOTE — Patient Care Conference (Signed)
Inpatient RehabilitationTeam Conference and Plan of Care Update Date: 04/22/2020   Time: 1:27 PM    Patient Name: [name] [name]      Medical Record Number: [id number]  Date of Birth: 1956-04-05 Sex: Female         Room/Bed: 4W25C/4W25C-01 Payor Info: Payor: [place] / Plan: BCBS COMM PPO / Product Type: *No Product type* /    Admit Date/Time:  04/21/2020  2:36 PM  Primary Diagnosis:  Left hemiparesis [facility])  Patient Active Problem List   Diagnosis Date Noted  . Meningioma ([place]) 04/21/2020  . Left hemiparesis ([place]) 04/21/2020  . Dyslipidemia   . Essential hypertension   . History of tobacco abuse   . History of meningioma   . Leucocytosis   . Acute blood loss anemia   . Brain tumor ([place]) 04/13/2020  . Seizure ([place]) 04/12/2020  . Occlusion and stenosis of carotid artery without mention of cerebral infarction 08/10/2012  . Fibromuscular dysplasia ([place]) 08/10/2012    Expected Discharge Date: Expected Discharge Date: (two week ELOS)  Team Members Present: Physician leading conference: Dr. Alysia Penna Care Coodinator Present: Nestor Lewandowsky, RN, BSN, CRRN;Christina Sampson Goon, [place] Nurse Present: Serena Croissant, LPN PT Present: Barrie Folk, PT OT Present: Willeen Cass, OT;Other (comment)(Hannah [place], OT) [place] Coordinator present : Ileana Ladd, PT     Current Status/Progress Goal Weekly Team Focus  Bowel/Bladder   Continent of bowel and bladder LBM 6/7  remain continent  assess toileting needs qshift and PRN   Swallow/Nutrition/ Hydration             ADL's   Min-mod assist bathing, min assist stand pivot transfers, mod assist dynamic standing balance, max assist UB and LB dressing  Supervision  ADL retraining, dynamic standing balance, LUE NMR, functional transfers   Mobility             Communication             Safety/Cognition/ Behavioral Observations            Pain   No c/o pain  remain pain free  Assess pain qshift and PRN   Skin    Surgical incision to head staples intact  promote proper wound healing.  assess skin qshift and PRN    Rehab Goals Patient on target to meet rehab goals: Yes Rehab Goals Revised: Patient being evaluated *See Care Plan and progress notes for long and short-term goals.     Barriers to Discharge  Current Status/Progress Possible Resolutions Date Resolved   Nursing  Decreased caregiver support               PT  Inaccessible home environment;Home environment access/layout;Medical stability;Pending chemo/radiation                 OT                  SLP                Care Coordinator                Discharge Planning/Teaching Needs:  Plans to discharge home  Will schedule education with spouse daughter or sister if reccommended   Team Discussion:  All disciplines have yet to complete evaluations; discharge date pending. Patient is doing well, continent of B+B. Min-Mod assist with transfers.  Revisions to Treatment Plan:  None    Medical Summary Current Status: Initiating therapy, craniotomy incision healing well no drainage, severe left upper extremity  greater than lower extremity weakness Weekly Focus/Goal: Initiate therapy plan, plan to discharge continue staples next week, monitor therapy tolerance  Barriers to Discharge: Medical stability;Wound care   Possible Resolutions to Barriers: See above, initiating rehab program monitoring response to exercise   Continued Need for Acute Rehabilitation Level of Care: The patient requires daily medical management by a physician with specialized training in physical medicine and rehabilitation for the following reasons: Direction of a multidisciplinary physical rehabilitation program to maximize functional independence : Yes Medical management of patient stability for increased activity during participation in an intensive rehabilitation regime.: Yes Analysis of laboratory values and/or radiology reports with any subsequent need for  medication adjustment and/or medical intervention. : Yes   I attest that I was present, lead the team conference, and concur with the assessment and plan of the team.   Dorien Chihuahua B 04/22/2020, 1:27 PM

## 2020-04-23 ENCOUNTER — Inpatient Hospital Stay (HOSPITAL_COMMUNITY): Payer: No Typology Code available for payment source | Admitting: Physical Therapy

## 2020-04-23 ENCOUNTER — Inpatient Hospital Stay (HOSPITAL_COMMUNITY): Payer: No Typology Code available for payment source | Admitting: Occupational Therapy

## 2020-04-23 ENCOUNTER — Other Ambulatory Visit: Payer: Self-pay | Admitting: Radiation Therapy

## 2020-04-23 MED ORDER — SENNA 8.6 MG PO TABS
1.0000 | ORAL_TABLET | Freq: Every day | ORAL | Status: DC | PRN
  Administered 2020-04-25 – 2020-04-28 (×3): 8.6 mg via ORAL
  Filled 2020-04-23 (×3): qty 1

## 2020-04-23 NOTE — Progress Notes (Signed)
Physical Therapy Session Note  Patient Details  Name: [name] [name] MRN: [id number] Date of Birth: [birth year]  Today's Date: 04/23/2020 PT Individual Time: 0915-1030 4709-6283 PT Individual Time Calculation (min): 75 min and 60 min   Short Term Goals: Week 1:  PT Short Term Goal 1 (Week 1): Pt will perform bed mobility with supervision assist PT Short Term Goal 2 (Week 1): Pt will transfer to and from [facility] with min assist consistently PT Short Term Goal 3 (Week 1): Pt will ambulate 42f with min assist and LRAD PT Short Term Goal 4 (Week 1): pt will ascend 4 steps with 1 rail and mod assist  Skilled Therapeutic Interventions/Progress Updates:  Session 1  Pt received supine in bed and agreeable to PT. Supine>sit transfer with supervision assist and min cues for safety.   Ambulatory tranfer to toilet with min-mod assist for safety and to prevent LOB over threshold into bathroom. Severe foot drop noted with Circumduction and increased hip flexion to compensate. Pt able to void bladder (+) sitting on toilet and perform personal hygiene. Upper and lower body dressing sitting on BSC with mod assist-max assist for clothing management and min assist for balance with sit<>stand to pull pant and underwear.   [facility] applied DF wrap. Gait training with RW and LUE hand splint x 1038fand 6033fith min assist overall. Noted to have several instances of GR with fatigue.  Dynamic gait training in parallel bars forward/reverse 2 x 8 with min assist. Side stepping R and L 4 x 8ft28fth min assist and cues cues for posture, step length and facilitation to posterior knee to prevent GR.   Nustep with min assist throughout to improve hip positioning and engage knee/hip extensors. 4 min, level 4, Borg RPE 13/20  Pt returned to room and performed  transfer to bed with min assist and no AD. Sit>supine completed with supervision assist, and left supine in bed with call bell in reach and  all needs met.    Session 2.  Pt received sitting in WC and agreeable to PT. Pt transported to day room in WC. [facility] applied Saebo NME to L anterior tib 10sec off, 10sec on. Pt engaged in hip/knee flexion while Anterior tib activated to encourage flexion symmetry to limb advancement and decrease use of hip hike to advance the LLE.   BWSTT: standing balance with min assist x 5 min with 1 UE support to don harness with supervision assist, no knee instability noted. Gait training on treadmill x 3 min at 0.6-0.7 mph and mod assist to advance the LLE with DF wrap in place. Moderate cues for symmetry of step length and improved glue activation in stance.    WC mobility with BUE x 150ft48f mod assist to force use of LUE in symmetry with the RUE. Hand over hand control of the RUE throughout WC mobility.   Pt returned to room and performed  transfer to bed with min assist and no AD. Sit>supine completed with supervision assist, and left supine in bed with call bell in reach and all needs met.          Therapy Documentation Precautions:  Precautions Precautions: Fall Restrictions Weight Bearing Restrictions: No    Pain: Pain Assessment Pain Scale: 0-10 Pain Score: 4  Pain Type: Surgical pain Pain Location: Head Pain Intervention(s): Medication (See eMAR)    Therapy/Group: Individual Therapy  AustiLorie Phenix/2021, 10:39 AM

## 2020-04-23 NOTE — Progress Notes (Signed)
Occupational Therapy Session Note  Patient Details  Name: Estefania Kamiya MRN: [id number] Date of Birth: 11-10-1956  Today's Date: 04/23/2020 OT Individual Time: 1335-1415 OT Individual Time Calculation (min): 40 min    Short Term Goals: Week 1:  OT Short Term Goal 1 (Week 1): Pt will dress UB with use of hemi techniques with modA. OT Short Term Goal 2 (Week 1): Pt will complete LB dressing with modA at sit to stand level, demonstrating improved dynamic standing balance. OT Short Term Goal 3 (Week 1): Pt will complete toilet transfer with minA with LRAD.  Skilled Therapeutic Interventions/Progress Updates:    Pt greeted at time of session sitting up in wheelchair agreeable to focus on toileting and NMR for LUE. Pt expressed need to toilet, set up at toilet and performed SPT with Min A, able to stand and don/doff LB clothing with Min A and able to briefly stand without UE support to use RUE to perform clothing management and indep with hygiene. After toileting set up at sink level and performed hand washing with therapist assist to integrate LUE into hand washing for bilateral integration and provide sensory integration to LUE, both with hot/cold water and rough hand towel. Pt performed weight wearing through [place] with focus on LUE in standing at sink level to provide input and improve awareness, lateral weight shifting in standing at sink level. AROM and AAROM for LUE at all planes including shoulder, elbow, forearm, and wrist/digits with more AROM and functional grip today in digits but continues to have trace wrist ROM. Functional task of grasp/release for LUE at table top level with distal support from therapist to improve functional grasp of objects and facilitate open/close of digits. Pt up in chair with alarm on, call bell in reach, all needs met.   Therapy Documentation Precautions:  Precautions Precautions: Fall Restrictions Weight Bearing Restrictions: No    Therapy/Group:  Individual Therapy  Viona Gilmore 04/23/2020, 4:40 PM

## 2020-04-23 NOTE — Progress Notes (Signed)
[place] PHYSICAL MEDICINE & REHABILITATION PROGRESS NOTE   Subjective/Complaints: No issues overnite , no pains, good day in therapy Loose stools yesterday on senna  ROS- neg CP, SOB, N/VD  Objective:   No results found. Recent Labs    04/21/20 1758 04/22/20 0532  WBC 13.6* 16.0*  HGB 12.0 11.8*  HCT 36.6 36.8  PLT 300 291   Recent Labs    04/21/20 1758 04/22/20 0532  NA  --  140  K  --  4.1  CL  --  105  CO2  --  26  GLUCOSE  --  104*  BUN  --  17  CREATININE 0.78 0.76  CALCIUM  --  9.0    Intake/Output Summary (Last 24 hours) at 04/23/2020 0844 Last data filed at 04/22/2020 1832 Gross per 24 hour  Intake 200 ml  Output --  Net 200 ml     Physical Exam: Vital Signs Blood pressure 116/66, pulse 80, temperature 98.2 F (36.8 C), temperature source Oral, resp. rate 16, height 5\' 3"  (1.6 m), weight 74.5 kg, SpO2 100 %.   General: No acute distress Mood and affect are appropriate Heart: Regular rate and rhythm no rubs murmurs or extra sounds Lungs: Clear to auscultation, breathing unlabored, no rales or wheezes Abdomen: Positive bowel sounds, soft nontender to palpation, nondistended Extremities: No clubbing, cyanosis, or edema Skin: No evidence of breakdown, no evidence of rash, crani wound CDI, Telfa dressing stapled into scalp  Neurologic: Cranial nerves II through XII intact, motor strength is 5/5 in right  deltoid, bicep, tricep, grip, hip flexor, knee extensors, ankle dorsiflexor and plantar flexor 3- left delt , 2- L bi/tri, 3- Left KE, HF, 0/5 L ankle DF   Cerebellar exam cannot perform Left finger to nose to finger as well as heel to shindue to weakness Musculoskeletal: Full range of motion in all 4 extremities. No joint swelling   Assessment/Plan: 1. Functional deficits secondary to Right frontal meningioma  which require 3+ hours per day of interdisciplinary therapy in a comprehensive inpatient rehab setting.  Physiatrist is providing close team  supervision and 24 hour management of active medical problems listed below.  Physiatrist and rehab team continue to assess barriers to discharge/monitor patient progress toward functional and medical goals  Care Tool:  Bathing    Body parts bathed by patient: Chest, Left arm, Abdomen, Front perineal area, Right upper leg, Left upper leg, Face, Buttocks   Body parts bathed by helper: Right arm, Right lower leg, Left lower leg     Bathing assist Assist Level: Moderate Assistance - Patient 50 - 74%     Upper Body Dressing/Undressing Upper body dressing   What is the patient wearing?: Hospital gown only    Upper body assist Assist Level: Maximal Assistance - Patient 25 - 49%    Lower Body Dressing/Undressing Lower body dressing      What is the patient wearing?: Hospital gown only     Lower body assist Assist for lower body dressing: Maximal Assistance - Patient 25 - 49%     Toileting Toileting    Toileting assist Assist for toileting: Moderate Assistance - Patient 50 - 74%     Transfers Chair/bed transfer  Transfers assist     Chair/bed transfer assist level: Moderate Assistance - Patient 50 - 74%     Locomotion Ambulation   Ambulation assist      Assist level: Maximal Assistance - Patient 25 - 49% Assistive device: Hand held assist Max  distance: 25   Walk 10 feet activity   Assist     Assist level: Maximal Assistance - Patient 25 - 49% Assistive device: Hand held assist, No Device   Walk 50 feet activity   Assist Walk 50 feet with 2 turns activity did not occur: Safety/medical concerns         Walk 150 feet activity   Assist Walk 150 feet activity did not occur: Safety/medical concerns         Walk 10 feet on uneven surface  activity   Assist Walk 10 feet on uneven surfaces activity did not occur: Safety/medical concerns         Wheelchair     Assist   Type of Wheelchair: Manual    Wheelchair assist level: Moderate  Assistance - Patient 50 - 74% Max wheelchair distance: 150    Wheelchair 50 feet with 2 turns activity    Assist        Assist Level: Moderate Assistance - Patient 50 - 74%   Wheelchair 150 feet activity     Assist      Assist Level: Moderate Assistance - Patient 50 - 74%   Blood pressure 116/66, pulse 80, temperature 98.2 F (36.8 C), temperature source Oral, resp. rate 16, height 5\' 3"  (1.6 m), weight 74.5 kg, SpO2 100 %.  Medical Problem List and Plan: 1.  Left side weakness secondary to recurrent meningioma status post right frontal craniotomy resection 04/16/2020- previous craniotomy for meningioma was 2014             -patient may shower if uses shower cap until staples out              -ELOS/Goals: 2-2.5 weeks- goals Mod I to supervision 2.  Antithrombotics: -DVT/anticoagulation: Subcutaneous heparin initiated 04/19/2020             -antiplatelet therapy: N/A 3. Pain Management: Oxycodone as needed 4. Mood: Provide emotional support             -antipsychotic agents: N/A 5. Neuropsych: This patient is capable of making decisions on her own behalf. 6. Skin/Wound Care: Routine skin checks, should be ready for staple removal next week , may remove telfa dressing today  7. Fluids/Electrolytes/Nutrition: Routine in and outs Repeat BMET nl 8.  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  Keppra 1500 mg twice daily 9.  Hypertension.  Toprol 12.5 mg daily.  Monitor with increased mobility Vitals:   04/22/20 1926 04/23/20 0404  BP: 109/65 116/66  Pulse: 85 80  Resp: 16 16  Temp: 98.5 F (36.9 C) 98.2 F (36.8 C)  SpO2: 99% 100%  Controlled 6/10 10.  Constipation.  Currently on MiraLAX as needed.  Adjust bowel program as needed 11.  Hyperlipidemia.  Lipitor 12.  History of tobacco use.  Provide counseling 13. Previous craniotomy with mild L hemiparesis since 2014 for meningioma resection   14.   Leukocytosis without fever will monitor , likely due to decadron LOS: 2 days A FACE TO FACE EVALUATION WAS PERFORMED  Charlett Blake 04/23/2020, 8:44 AM

## 2020-04-24 ENCOUNTER — Inpatient Hospital Stay (HOSPITAL_COMMUNITY): Payer: No Typology Code available for payment source | Admitting: Occupational Therapy

## 2020-04-24 ENCOUNTER — Inpatient Hospital Stay (HOSPITAL_COMMUNITY): Payer: No Typology Code available for payment source | Admitting: *Deleted

## 2020-04-24 ENCOUNTER — Inpatient Hospital Stay (HOSPITAL_COMMUNITY): Payer: No Typology Code available for payment source | Admitting: Physical Therapy

## 2020-04-24 NOTE — Progress Notes (Signed)
Physical Therapy Session Note  Patient Details  Name: [name] [name] MRN: [id number] Date of Birth: 1956/10/26  Today's Date: 04/24/2020 PT Individual Time: 0805-0910 PT Individual Time Calculation (min): 65 min   Short Term Goals: Week 1:  PT Short Term Goal 1 (Week 1): Pt will perform bed mobility with supervision assist PT Short Term Goal 2 (Week 1): Pt will transfer to and from [facility] with min assist consistently PT Short Term Goal 3 (Week 1): Pt will ambulate 71f with min assist and LRAD PT Short Term Goal 4 (Week 1): pt will ascend 4 steps with 1 rail and mod assist   Skilled Therapeutic Interventions/Progress Updates:    Pt received sitting in WC and agreeable to PT. PT assisted pt to perform upper and lower body dressing. Min assist for sit<>stand to pull underpants and pant to waist. Max assist for clothing management with hand over hand assist to utilize hemi technique. PT donnes pt's shoes for time management.   Pt transported to day room. DF wrap applied to LLE. BWSTT 0.867m 3 bouts x 3 min with prolonged rest break between bouts. Min-mod assist to advance the LLE and facilitate improved HS activation in swing as well as cues for decreased force of heel contact on the R to improve hip abductor activation. PT also applied tension to L ASIS to improve pelvic position/rotation.   Patient returned to room. PT applied Saebo NMES to Anterior tib 10 sec on/off for DF strengthening. Pt left sitting in WC with call bell in reach and all needs met.         Therapy Documentation Precautions:  Precautions Precautions: Fall Restrictions Weight Bearing Restrictions: No  Pain: Pain Assessment Pain Scale: 0-10 Pain Score: 0-No pain Pain Type: Acute pain Pain Location: Generalized Pain Descriptors / Indicators: Sore Pain Onset: Awakened from sleep Pain Intervention(s): Medication (See eMAR)     Therapy/Group: Individual Therapy  AuLorie Phenix/09/2020, 9:10 AM

## 2020-04-24 NOTE — Progress Notes (Signed)
Occupational Therapy Session Note  Patient Details  Name: [name] [name] MRN: [id number] Date of Birth: [birth year]  Today's Date: 04/24/2020 OT Individual Time: 1025-8527 and 7824-2353 OT Individual Time Calculation (min): 57 min and 56 min   Short Term Goals: Week 1:  OT Short Term Goal 1 (Week 1): Pt will dress UB with use of hemi techniques with modA. OT Short Term Goal 2 (Week 1): Pt will complete LB dressing with modA at sit to stand level, demonstrating improved dynamic standing balance. OT Short Term Goal 3 (Week 1): Pt will complete toilet transfer with minA with LRAD.  Skilled Therapeutic Interventions/Progress Updates:    Pt greeted at time of session sitting up in wheelchair no c/o pain. Expressed need to toilet prior to leaving the room, set up at toilet in w/c and pt transferred to toilet with CGA-Min with use of RUE on grab bar, able to perform clothing management donning and doffing with Min A, extended time to coordinate LLE. Pt able to perform hygiene independently, husband present for toileting session to begin family ed for her transfers. Pt practiced ambulating to bathroom with RW and L hand support with Min A for ambulating to bathroom for balance and RW management, Min A for transfer after ambulating. Pt brought to gym via wheelchair and participated in several NMR activities for LUE with therapist providing distal support to cross midline, pt able to grasp objects/digit flexion but needs assist for digit extension for release of objects. Arm bike for NMR of LUE in standing for 5 minutes with band to hold LUE and therapist assist for shoulder ROM. Ended session back in her room for Estim with Saebo on L wrist extensors with 10 seconds on/off cycle while performing functional task of grasp/release of objects. Pt up in chair with alarm on, call bell in reach, and all needs met.  Session 2: Pt greeted at time of session sitting up in wheelchair after lunch, needing to use  bathroom. Set up at toilet and pt performed SPT to/from toilet CGA-Min A and Min A to don/doff pants and underwear at L hip with therapist hand over hand assist to encourage functional use of L hand for clothing management. Hand washing at sink level with sensory integration of hot/cold water, tapping, and rough cloth from the towel to provide input for LUE. Pt brought to ADL suite to practice transfers to/from TTB at tub shower level with demonstration from therapist and pt demonstrated with Min A with ambulating short distance to tub with RW, Min A to get BLEs over tub wall. Collaborated with pt and husband regarding home set up for bathroom and getting in/out of house as they are going to put in [place] around 3-4 steps to get onto front porch. Reviewed AAROM/PROM exercises for LUE with pt seated to provide stretch to decrease abnormal tone (note an increase in tone and spasticity along with functional use today). Pt returned to bed in supine with Min A and call bell in reach, alarm on, all needs met.   Therapy Documentation Precautions:  Precautions Precautions: Fall Restrictions Weight Bearing Restrictions: No   Therapy/Group: Individual Therapy  Viona Gilmore 04/24/2020, 12:47 PM

## 2020-04-24 NOTE — Progress Notes (Signed)
[place] PHYSICAL MEDICINE & REHABILITATION PROGRESS NOTE   Subjective/Complaints: No issues overnight, discussed therapy, bowel movements, sleep ROS- neg CP, SOB, N/VD  Objective:   No results found. Recent Labs    04/21/20 1758 04/22/20 0532  WBC 13.6* 16.0*  HGB 12.0 11.8*  HCT 36.6 36.8  PLT 300 291   Recent Labs    04/21/20 1758 04/22/20 0532  NA  --  140  K  --  4.1  CL  --  105  CO2  --  26  GLUCOSE  --  104*  BUN  --  17  CREATININE 0.78 0.76  CALCIUM  --  9.0    Intake/Output Summary (Last 24 hours) at 04/24/2020 0909 Last data filed at 04/24/2020 0800 Gross per 24 hour  Intake 480 ml  Output --  Net 480 ml     Physical Exam: Vital Signs Blood pressure 105/73, pulse 82, temperature 97.8 F (36.6 C), temperature source Oral, resp. rate 18, height 5\' 3"  (1.6 m), weight 74.5 kg, SpO2 97 %.    General: No acute distress Mood and affect are appropriate Heart: Regular rate and rhythm no rubs murmurs or extra sounds Lungs: Clear to auscultation, breathing unlabored, no rales or wheezes Abdomen: Positive bowel sounds, soft nontender to palpation, nondistended Extremities: No clubbing, cyanosis, or edema  Sensory exam normal sensation to light touch and proprioception in bilateral upper and lower extremities  Skin: No evidence of breakdown, no evidence of rash, crani wound CDI, Telfa dressing stapled into scalp  Neurologic: Cranial nerves II through XII intact, motor strength is 5/5 in right  deltoid, bicep, tricep, grip, hip flexor, knee extensors, ankle dorsiflexor and plantar flexor 3- left delt , 2- L bi/tri, 3- Left KE, HF, 0/5 L ankle DF   Cerebellar exam cannot perform Left finger to nose to finger as well as heel to shindue to weakness Musculoskeletal: Full range of motion in all 4 extremities. No joint swelling   Assessment/Plan: 1. Functional deficits secondary to Right frontal meningioma  which require 3+ hours per day of interdisciplinary  therapy in a comprehensive inpatient rehab setting.  Physiatrist is providing close team supervision and 24 hour management of active medical problems listed below.  Physiatrist and rehab team continue to assess barriers to discharge/monitor patient progress toward functional and medical goals  Care Tool:  Bathing    Body parts bathed by patient: Chest, Left arm, Abdomen, Front perineal area, Right upper leg, Left upper leg, Face, Buttocks   Body parts bathed by helper: Right arm, Right lower leg, Left lower leg     Bathing assist Assist Level: Moderate Assistance - Patient 50 - 74%     Upper Body Dressing/Undressing Upper body dressing   What is the patient wearing?: Hospital gown only    Upper body assist Assist Level: Maximal Assistance - Patient 25 - 49%    Lower Body Dressing/Undressing Lower body dressing      What is the patient wearing?: Hospital gown only     Lower body assist Assist for lower body dressing: Maximal Assistance - Patient 25 - 49%     Toileting Toileting    Toileting assist Assist for toileting: Minimal Assistance - Patient > 75%     Transfers Chair/bed transfer  Transfers assist     Chair/bed transfer assist level: Moderate Assistance - Patient 50 - 74%     Locomotion Ambulation   Ambulation assist      Assist level: Maximal Assistance - Patient  25 - 49% Assistive device: Hand held assist Max distance: 25   Walk 10 feet activity   Assist     Assist level: Maximal Assistance - Patient 25 - 49% Assistive device: Hand held assist, No Device   Walk 50 feet activity   Assist Walk 50 feet with 2 turns activity did not occur: Safety/medical concerns         Walk 150 feet activity   Assist Walk 150 feet activity did not occur: Safety/medical concerns         Walk 10 feet on uneven surface  activity   Assist Walk 10 feet on uneven surfaces activity did not occur: Safety/medical concerns          Wheelchair     Assist   Type of Wheelchair: Manual    Wheelchair assist level: Moderate Assistance - Patient 50 - 74% Max wheelchair distance: 150    Wheelchair 50 feet with 2 turns activity    Assist        Assist Level: Moderate Assistance - Patient 50 - 74%   Wheelchair 150 feet activity     Assist      Assist Level: Moderate Assistance - Patient 50 - 74%   Blood pressure 105/73, pulse 82, temperature 97.8 F (36.6 C), temperature source Oral, resp. rate 18, height 5\' 3"  (1.6 m), weight 74.5 kg, SpO2 97 %.  Medical Problem List and Plan: 1.  Left side weakness secondary to recurrent meningioma status post right frontal craniotomy resection 04/16/2020- previous craniotomy for meningioma was 2014             -patient may shower if uses shower cap until staples out              -ELOS/Goals: 2-2.5 weeks- goals Mod I to supervision 2.  Antithrombotics: -DVT/anticoagulation: Subcutaneous heparin initiated 04/19/2020             -antiplatelet therapy: N/A 3. Pain Management: Oxycodone as needed 4. Mood: Provide emotional support             -antipsychotic agents: N/A 5. Neuropsych: This patient is capable of making decisions on her own behalf. 6. Skin/Wound Care: Routine skin checks, should be ready for staple removal next week , may remove telfa dressing today  7. Fluids/Electrolytes/Nutrition: Routine in and outs Repeat BMET nl 8.  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  Keppra 1500 mg twice daily 9.  Hypertension.  Toprol 12.5 mg daily.  Monitor with increased mobility Vitals:   04/23/20 1955 04/24/20 0505  BP: (!) 109/58 105/73  Pulse: 80 82  Resp:    Temp: 97.9 F (36.6 C) 97.8 F (36.6 C)  SpO2: 100% 97%  Controlled 6/11 10.  Constipation.  Currently on MiraLAX as needed.  Adjust bowel program as needed 11.  Hyperlipidemia.  Lipitor 12.  History of tobacco use.  Provide counseling 13.  Previous craniotomy with mild L hemiparesis since 2014 for meningioma resection   14.  Leukocytosis without fever will monitor , likely due to decadron LOS: 3 days A FACE TO FACE EVALUATION WAS PERFORMED  Charlett Blake 04/24/2020, 9:09 AM

## 2020-04-24 NOTE — IPOC Note (Signed)
Overall Plan of Care [facility]) Patient Details Name: [name] [name] MRN: [id number] DOB: February 17, 1956  Admitting Diagnosis: Left hemiparesis [facility])  Hospital Problems: Principal Problem:   Left hemiparesis ([place]) Active Problems:   Meningioma ([place])     Functional Problem List: Nursing Bladder, Endurance, Pain, Safety, Medication Management  PT Balance, Edema, Endurance, Motor, Nutrition, Pain, Safety  OT Balance, Motor, Endurance, Sensory, Vision  SLP    TR         Basic ADL's: OT Eating, Grooming, Bathing, Dressing, Toileting     Advanced  ADL's: OT Simple Meal Preparation, Laundry     Transfers: PT Bed Mobility, Bed to Chair, Car, [employer], Futures trader, Metallurgist: PT Ambulation, Emergency planning/management officer, Stairs     Additional Impairments: OT Fuctional Use of Upper Extremity  SLP        TR      Anticipated Outcomes Item Anticipated Outcome  Self Feeding set up  Swallowing      Basic self-care  supervision  Toileting  supervision   Bathroom Transfers supervision  Bowel/Bladder  Manage bladder with timed toileting  Transfers  Supervision assist with LRAD  Locomotion  ambulatory with supevision assist and LRAD  Communication     Cognition     Pain  Pain at or below level 3 with prn medications  Safety/Judgment  Maintain safety with supervision level   Therapy Plan: PT Intensity: Minimum of 1-2 x/day ,45 to 90 minutes PT Frequency: 5 out of 7 days PT Duration Estimated Length of Stay: 14-17 days OT Intensity: Minimum of 1-2 x/day, 45 to 90 minutes OT Frequency: 5 out of 7 days OT Duration/Estimated Length of Stay: 14-16 days     Due to the current state of emergency, patients may not be receiving their 3-hours of Medicare-mandated therapy.   Team Interventions: Nursing Interventions Patient/Family Education, Skin Care/Wound Management, Medication Management, Disease Management/Prevention, Discharge Planning  PT interventions  Ambulation/gait training, Balance/vestibular training, Cognitive remediation/compensation, Community reintegration, Discharge planning, Disease management/prevention, DME/adaptive equipment instruction, Functional electrical stimulation, Functional mobility training, Neuromuscular re-education, Pain management, Patient/family education, Psychosocial support, Skin care/wound management, Splinting/orthotics, Stair training, Therapeutic Activities, Therapeutic Exercise, UE/LE Strength taining/ROM, UE/LE Coordination activities, Visual/perceptual remediation/compensation, Wheelchair propulsion/positioning  OT Interventions Balance/vestibular training, Neuromuscular re-education, Self Care/advanced ADL retraining, UE/LE Coordination activities, Therapeutic Activities, Functional mobility training, Discharge planning, Community reintegration, Patient/family education, UE/LE Strength taining/ROM, DME/adaptive equipment instruction, Therapeutic Exercise, Pain management, Visual/perceptual remediation/compensation, Functional electrical stimulation, Psychosocial support  SLP Interventions    TR Interventions    SW/CM Interventions Discharge Planning, Psychosocial Support, Patient/Family Education   Barriers to Discharge MD  Medical stability  Nursing Decreased caregiver support    PT Inaccessible home environment, Home environment access/layout, Medical stability, Pending chemo/radiation    OT      SLP      SW       Team Discharge Planning: Destination: PT-Home ,OT- Home , SLP-  Projected Follow-up: PT-Home health PT, OT-  Outpatient OT, SLP-  Projected Equipment Needs: PT-Rolling walker with 5" wheels, OT- None recommended by OT, SLP-  Equipment Details: PT- , OT-pt states having tub bench from previous surgery Patient/family involved in discharge planning: PT- Patient,  OT-Patient, SLP-   MD ELOS: 14-18d Medical Rehab Prognosis:  Excellent Assessment:  64 year old right-handed female with  history of hyperlipidemia, hypertension, history of tobacco abuse, craniotomy for tumor excision/meningioma 09/10/2013.  Per chart review lives with husband.  Independent working as a Marine scientist.  Two-level home bed and  bath on main level 5 steps to entry and good family support.  Presented 04/12/2020 with left hemiparesis and seizure.  Patient on no antiepileptic medication prior to admission.  Admission chemistries unremarkable except glucose mildly elevated 139.  EEG identified no seizure.  MRI showed recurrent meningioma along the right frontoparietal convexity and falx with some contralateral extension.  Compression and possible invasion of the adjacent superior sagittal sinus which remain patent.  Patient underwent stereotactic right frontal craniotomy resection of meningioma 04/16/2020 per Dr. Kathyrn Sheriff.  Maintained on Keppra for seizure prophylaxis as well as Decadron protocol.  Subcutaneous heparin added for DVT prophylaxis 04/19/2020.  Patient is tolerating a regular diet.  Therapy evaluations completed and patient was admitted for a comprehensive rehab program.   Now requiring 24/7 Rehab RN,MD, as well as CIR level PT, OT and SLP.  Treatment team will focus on ADLs and mobility with goals set at Sup See Team Conference Notes for weekly updates to the plan of care

## 2020-04-24 NOTE — Evaluation (Signed)
Recreational Therapy Assessment and Plan  Patient Details  Name: [name] [name] MRN: [id number] Date of Birth: [birth year] Today's Date: 04/24/2020  Rehab Potential:   Good ELOS:   2 weeks  Assessment  Problem List:      Patient Active Problem List   Diagnosis Date Noted  . Meningioma ([place]) 04/21/2020  . Left hemiparesis ([place]) 04/21/2020  . Dyslipidemia   . Essential hypertension   . History of tobacco abuse   . History of meningioma   . Leucocytosis   . Acute blood loss anemia   . Brain tumor ([place]) 04/13/2020  . Seizure ([place]) 04/12/2020  . Occlusion and stenosis of carotid artery without mention of cerebral infarction 08/10/2012  . Fibromuscular dysplasia ([place]) 08/10/2012    Past Medical History:      Past Medical History:  Diagnosis Date  . Bruises easily   . Carotid artery occlusion   . Dizziness   . Headache(784.0)   . Hyperlipidemia    takes Atorvastatin daily  . PONV (postoperative nausea and vomiting)   . Stroke ([place])   . TIA (transient ischemic attack)    Past Surgical History:       Past Surgical History:  Procedure Laterality Date  . ABDOMINAL HYSTERECTOMY  2002  . APPLICATION OF CRANIAL NAVIGATION N/A 04/16/2020   Procedure: APPLICATION OF CRANIAL NAVIGATION;  Surgeon: Consuella Lose, MD;  Location: [facility];  Service: Neurosurgery;  Laterality: N/A;  . COLONOSCOPY    . CRANIOTOMY Right 09/10/2013   Procedure: Craniotomy for tumor excision;  Surgeon: Consuella Lose, MD;  Location: [facility];  Service: Neurosurgery;  Laterality: Right;  Craniotomy for meningioma with stealth  . CRANIOTOMY N/A 04/16/2020   Procedure: STEREOTACTIC CRANIOTOMY FOR RESECTION OF MENINGIOMA;  Surgeon: Consuella Lose, MD;  Location: [place];  Service: Neurosurgery;  Laterality: N/A;  . DILATION AND CURETTAGE OF UTERUS  1980  . left hand surgery  2007 and 2008   plates and screws    Assessment & Plan Clinical Impression: Patient is a  64 year old right-handed female with history of hyperlipidemia, hypertension, history of tobacco abuse, craniotomy for tumor excision/meningioma 09/10/2013. Per chart review lives with husband. Independent working as a Marine scientist. Two-level home bed and bath on main level 5 steps to entry and good family support. Presented 04/12/2020 with left hemiparesis and seizure. Patient on no antiepileptic medication prior to admission. Admission chemistries unremarkable except glucose mildly elevated 139. EEG identified no seizure. MRI showed recurrent meningioma along the right frontoparietal convexity and falx with some contralateral extension. Compression and possible invasion of the adjacent superior sagittal sinus which remain patent. Patient underwent stereotactic right frontal craniotomy resection of meningioma 04/16/2020 per Dr. Kathyrn Sheriff.   Patient transferred to CIR on 04/21/2020.   Pt presents with decreased activity tolerance, decreased functional mobility, decreased balance, decreased coordination Limiting pt's independence with leisure/community pursuits.  Met with pt and husband to discuss TR services, leisure interests and relaxation strategies as pt reports a lot of stress PTA in addition to current hospitalization.  Discussed diaphragmatic breathing, progressive muscle relaxation, visualization & aroma therapy.  Pt stated understanding and appreciation of the above information.  Diversional activited provided in the room for use as well.  Discussed the use of aromatherapy as a therapy intervention including type of oil used, how it would be delivered, desired effect and complications from use including headache and or skin irritation depending on how oil is delivered. 2 drops of lavender essential oil was applied to a cottonball & placed  on her bedside table for inhalation in the room.  Nursing alerted about pts use of aromatherapy and instructed to remove essential oil from the room and discard it  in the trash if pt requesting removal and/or complains of headache.  Pt stated understanding and is agreement with the above.   Plan Min 1 TR session >20 minutes per week during LOS  Recommendations for other services: None   Discharge Criteria: Patient will be discharged from TR if patient refuses treatment 3 consecutive times without medical reason.  If treatment goals not met, if there is a change in medical status, if patient makes no progress towards goals or if patient is discharged from hospital.  The above assessment, treatment plan, treatment alternatives and goals were discussed and mutually agreed upon: by patient  [place] 04/24/2020, 1:02 PM

## 2020-04-24 NOTE — Progress Notes (Signed)
Orthopedic Tech Progress Note Patient Details:  [name] September 07, 1956 [id number]  Ordered Left PRAFO brace  Patient ID: [name] [name], female   DOB: 1956/09/15, 64 y.o.   MRN: [id number]   Tammy Sours 04/24/2020, 2:44 PM

## 2020-04-25 ENCOUNTER — Inpatient Hospital Stay (HOSPITAL_COMMUNITY): Payer: No Typology Code available for payment source

## 2020-04-25 ENCOUNTER — Inpatient Hospital Stay (HOSPITAL_COMMUNITY): Payer: No Typology Code available for payment source | Admitting: Physical Therapy

## 2020-04-25 ENCOUNTER — Inpatient Hospital Stay (HOSPITAL_COMMUNITY): Payer: No Typology Code available for payment source | Admitting: Occupational Therapy

## 2020-04-25 NOTE — Progress Notes (Signed)
Occupational Therapy Session Note  Patient Details  Name: [name] [name] MRN: [id number] Date of Birth: Apr 27, 1956  Today's Date: 04/25/2020 OT Individual Time: 1345-1413 OT Individual Time Calculation (min): 28 min   Short Term Goals: Week 1:  OT Short Term Goal 1 (Week 1): Pt will dress UB with use of hemi techniques with modA. OT Short Term Goal 2 (Week 1): Pt will complete LB dressing with modA at sit to stand level, demonstrating improved dynamic standing balance. OT Short Term Goal 3 (Week 1): Pt will complete toilet transfer with minA with LRAD.  Skilled Therapeutic Interventions/Progress Updates:    Pt greeted in bed with no c/o pain. Agreeable to session and requesting to use the restroom first. Supine<sit from flat bed without bedrail completed with setup assistance, pt able to manage blankets herself. She donned shoes while sitting EOB with manual assist to maintain seated figure 4 with the Lt LE. OT assisted with tying laces as well. CGA for ambulatory toilet transfer using RW and for toileting tasks with pt having +bladder void. CGA while standing at the sink to wash her hands after. Pt transferred to the w/c where OT threaded sneakers with elastic laces to increase her functional independence with donning footwear. Pt appreciative. Left her with all needs within reach and safety belt fastened.   Therapy Documentation Precautions:  Precautions Precautions: Fall Restrictions Weight Bearing Restrictions: No Vital Signs: Therapy Vitals Temp: 98.2 F (36.8 C) Temp Source: Oral Pulse Rate: 99 Resp: 14 BP: 110/64 Patient Position (if appropriate): Lying Oxygen Therapy SpO2: 99 % O2 Device: Room Air ADL: ADL Eating: Not assessed Upper Body Bathing: Moderate assistance Where Assessed-Upper Body Bathing: Shower (tub transfer bench) Lower Body Bathing: Moderate assistance Where Assessed-Lower Body Bathing: Shower (tub transfer bench) Upper Body Dressing: Maximal  assistance Where Assessed-Upper Body Dressing: Wheelchair Lower Body Dressing: Maximal assistance Where Assessed-Lower Body Dressing: Wheelchair (sit to stand level) Toileting: Not assessed Toilet Transfer: Not assessed Tub/Shower Transfer: Minimal assistance Social research officer, government: Minimal assistance Social research officer, government Method: Education officer, environmental: Radio broadcast assistant, Grab bars      Therapy/Group: Individual Therapy  [name] [name] [name] [name] 04/25/2020, 3:55 PM

## 2020-04-25 NOTE — Plan of Care (Signed)
  Problem: Consults Goal: RH BRAIN INJURY PATIENT EDUCATION Description: Description: See Patient Education module for eduction specifics Outcome: Progressing Goal: Skin Care Protocol Initiated - if Braden Score 18 or less Description: If consults are not indicated, leave blank or document N/A Outcome: Progressing Goal: Nutrition Consult-if indicated Outcome: Progressing   Problem: RH BOWEL ELIMINATION Goal: RH STG MANAGE BOWEL WITH ASSISTANCE Description: STG Manage Bowel with mod I  Assistance. Outcome: Progressing Goal: RH STG MANAGE BOWEL W/MEDICATION W/ASSISTANCE Description: STG Manage Bowel with Medication with mod I Assistance. Outcome: Progressing   Problem: RH BLADDER ELIMINATION Goal: RH STG MANAGE BLADDER WITH ASSISTANCE Description: STG Manage Bladder With no Assistance Outcome: Progressing   Problem: RH SKIN INTEGRITY Goal: RH STG ABLE TO PERFORM INCISION/WOUND CARE W/ASSISTANCE Description: STG Able To Perform Incision/Wound Care With  min Assistance. Outcome: Progressing   Problem: RH SAFETY Goal: RH STG ADHERE TO SAFETY PRECAUTIONS W/ASSISTANCE/DEVICE Description: STG Adhere to Safety Precautions With supervision Assistance/Device. Outcome: Progressing Goal: RH STG DECREASED RISK OF FALL WITH ASSISTANCE Description: STG Decreased Risk of Fall With  supervision Assistance. Outcome: Progressing   Problem: RH COGNITION-NURSING Goal: RH STG ANTICIPATES NEEDS/CALLS FOR ASSIST W/ASSIST/CUES Description: STG Anticipates Needs/Calls for Assist With  supervision Assistance/Cues. Outcome: Progressing   Problem: RH PAIN MANAGEMENT Goal: RH STG PAIN MANAGED AT OR BELOW PT'S PAIN GOAL Description: At or below level 3 Outcome: Progressing   Problem: RH KNOWLEDGE DEFICIT BRAIN INJURY Goal: RH STG INCREASE KNOWLEDGE OF SELF CARE AFTER BRAIN INJURY Description: Patient and spouse will be able to manage care at discharge independently using handouts/resources as  references Outcome: Progressing

## 2020-04-25 NOTE — Progress Notes (Signed)
[facility] PHYSICAL MEDICINE & REHABILITATION PROGRESS NOTE   Subjective/Complaints: Doing well. Just finished breakfast before I came in. No complaints  ROS: Patient denies fever, rash, sore throat, blurred vision, nausea, vomiting, diarrhea, cough, shortness of breath or chest pain, joint or back pain, headache, or mood change.    Objective:   No results found. No results for input(s): WBC, HGB, HCT, PLT in the last 72 hours. No results for input(s): NA, K, CL, CO2, GLUCOSE, BUN, CREATININE, CALCIUM in the last 72 hours.  Intake/Output Summary (Last 24 hours) at 04/25/2020 1217 Last data filed at 04/25/2020 0810 Gross per 24 hour  Intake 400 ml  Output --  Net 400 ml     Physical Exam: Vital Signs Blood pressure 108/69, pulse 79, temperature 97.8 F (36.6 C), temperature source Oral, resp. rate 18, height 5\' 3"  (1.6 m), weight 74.5 kg, SpO2 100 %.    Constitutional: No distress . Vital signs reviewed. HEENT: EOMI, oral membranes moist Neck: supple Cardiovascular: RRR without murmur. No JVD    Respiratory/Chest: CTA Bilaterally without wheezes or rales. Normal effort    GI/Abdomen: BS +, non-tender, non-distended Ext: no clubbing, cyanosis, or edema Psych: pleasant and cooperative Sensory exam normal sensation to light touch and proprioception in bilateral upper and lower extremities  Skin: crain wound CDI with staples Neurologic: Cranial nerves II through XII intact, motor strength is 5/5 in right  deltoid, bicep, tricep, grip, hip flexor, knee extensors, ankle dorsiflexor and plantar flexor 3- left delt , 2- L bi/tri, 3- Left KE, HF, 0/5 L ankle DF--wearing PRAFO   Cerebellar exam cannot perform Left finger to nose to finger as well as heel to shindue to weakness  Musculoskeletal: normal PROM, no swelling   Assessment/Plan: 1. Functional deficits secondary to Right frontal meningioma  which require 3+ hours per day of interdisciplinary therapy in a comprehensive  inpatient rehab setting.  Physiatrist is providing close team supervision and 24 hour management of active medical problems listed below.  Physiatrist and rehab team continue to assess barriers to discharge/monitor patient progress toward functional and medical goals  Care Tool:  Bathing    Body parts bathed by patient: Chest, Left arm, Abdomen, Front perineal area, Right upper leg, Left upper leg, Face, Buttocks   Body parts bathed by helper: Right arm, Right lower leg, Left lower leg     Bathing assist Assist Level: Moderate Assistance - Patient 50 - 74%     Upper Body Dressing/Undressing Upper body dressing   What is the patient wearing?: Hospital gown only    Upper body assist Assist Level: Maximal Assistance - Patient 25 - 49%    Lower Body Dressing/Undressing Lower body dressing      What is the patient wearing?: Hospital gown only     Lower body assist Assist for lower body dressing: Maximal Assistance - Patient 25 - 49%     Toileting Toileting    Toileting assist Assist for toileting: Minimal Assistance - Patient > 75%     Transfers Chair/bed transfer  Transfers assist     Chair/bed transfer assist level: Moderate Assistance - Patient 50 - 74%     Locomotion Ambulation   Ambulation assist      Assist level: Maximal Assistance - Patient 25 - 49% Assistive device: Hand held assist Max distance: 25   Walk 10 feet activity   Assist     Assist level: Maximal Assistance - Patient 25 - 49% Assistive device: Hand held assist, No  Device   Walk 50 feet activity   Assist Walk 50 feet with 2 turns activity did not occur: Safety/medical concerns         Walk 150 feet activity   Assist Walk 150 feet activity did not occur: Safety/medical concerns         Walk 10 feet on uneven surface  activity   Assist Walk 10 feet on uneven surfaces activity did not occur: Safety/medical concerns         Wheelchair     Assist   Type  of Wheelchair: Manual    Wheelchair assist level: Moderate Assistance - Patient 50 - 74% Max wheelchair distance: 150    Wheelchair 50 feet with 2 turns activity    Assist        Assist Level: Moderate Assistance - Patient 50 - 74%   Wheelchair 150 feet activity     Assist      Assist Level: Moderate Assistance - Patient 50 - 74%   Blood pressure 108/69, pulse 79, temperature 97.8 F (36.6 C), temperature source Oral, resp. rate 18, height 5\' 3"  (1.6 m), weight 74.5 kg, SpO2 100 %.  Medical Problem List and Plan: 1.  Left side weakness secondary to recurrent meningioma status post right frontal craniotomy resection 04/16/2020- previous craniotomy for meningioma was 2014             -patient may shower if uses shower cap until staples out              -ELOS/Goals: 2-2.5 weeks- goals Mod I to supervision 2.  Antithrombotics: -DVT/anticoagulation: Subcutaneous heparin initiated 04/19/2020             -antiplatelet therapy: N/A 3. Pain Management: Oxycodone as needed 4. Mood: Provide emotional support             -antipsychotic agents: N/A 5. Neuropsych: This patient is capable of making decisions on her own behalf. 6. Skin/Wound Care: Routine skin checks, should be ready for staple removal next week    7. Fluids/Electrolytes/Nutrition: Routine in and outs Repeat BMET nl 8.  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  Keppra 1500 mg twice daily 9.  Hypertension.  Toprol 12.5 mg daily.  Monitor with increased mobility Vitals:   04/24/20 2004 04/25/20 0505  BP: 106/70 108/69  Pulse: 85 79  Resp:    Temp: 98.2 F (36.8 C) 97.8 F (36.6 C)  SpO2: 97% 100%  Controlled 6/12 10.  Constipation.  Currently on MiraLAX as needed.  Adjust bowel program as needed 11.  Hyperlipidemia.  Lipitor 12.  History of tobacco use.  Provide counseling 13. Previous craniotomy with mild L hemiparesis since 2014 for meningioma  resection   14.  Leukocytosis without fever will monitor , likely due to decadron LOS: 4 days A FACE TO FACE EVALUATION WAS PERFORMED  Meredith Staggers 04/25/2020, 12:17 PM

## 2020-04-25 NOTE — Progress Notes (Signed)
Occupational Therapy Session Note  Patient Details  Name: [name] [name] MRN: [id number] Date of Birth: 09/29/1956  Today's Date: 04/25/2020 OT Individual Time: 1015-1045 OT Individual Time Calculation (min): 30 min    Short Term Goals: Week 1:  OT Short Term Goal 1 (Week 1): Pt will dress UB with use of hemi techniques with modA. OT Short Term Goal 2 (Week 1): Pt will complete LB dressing with modA at sit to stand level, demonstrating improved dynamic standing balance. OT Short Term Goal 3 (Week 1): Pt will complete toilet transfer with minA with LRAD.  Skilled Therapeutic Interventions/Progress Updates:    1:1. Pt received in w/c with no pain and reporting needing to toilet. Pt compeles ambulatory transfer into bathroom with RW and MIN A for balance/sit to stand and Min facilitation at Pts L foot with OT foot to prevent adduction/inversion while stepping through. Pt able ot manage pants with MIN A and complete hygiene seated. Pt completes grooming seated at sink with supervision and returns to bed with min A via stand pivot with no AD. Pt provided with self ROM HEP and briefly reviews with OT. Pt to have another OT session later and that OT alterted to review further with pt as needed. Pt in bed, exit alarm on and call light tin reach  Therapy Documentation Precautions:  Precautions Precautions: Fall Restrictions Weight Bearing Restrictions: No General:   Vital Signs:   Pain:   ADL: ADL Eating: Not assessed Upper Body Bathing: Moderate assistance Where Assessed-Upper Body Bathing: Shower (tub transfer bench) Lower Body Bathing: Moderate assistance Where Assessed-Lower Body Bathing: Shower (tub transfer bench) Upper Body Dressing: Maximal assistance Where Assessed-Upper Body Dressing: Wheelchair Lower Body Dressing: Maximal assistance Where Assessed-Lower Body Dressing: Wheelchair (sit to stand level) Toileting: Not assessed Toilet Transfer: Not assessed Tub/Shower  Transfer: Minimal assistance Social research officer, government: Minimal assistance Social research officer, government Method: Education officer, environmental: Radio broadcast assistant, Systems analyst    Praxis   Exercises:   Other Treatments:     Therapy/Group: Individual Therapy  Tonny Branch 04/25/2020, 10:32 AM

## 2020-04-25 NOTE — Progress Notes (Signed)
Physical Therapy Session Note  Patient Details  Name: [name] [name] MRN: [id number] Date of Birth: 10/26/1956  Today's Date: 04/25/2020 PT Individual Time: 1525-1650 [phone]   PT Individual Time Calculation (min): 85 min 55 min   Short Term Goals: Week 1:  PT Short Term Goal 1 (Week 1): Pt will perform bed mobility with supervision assist PT Short Term Goal 2 (Week 1): Pt will transfer to and from [facility] with min assist consistently PT Short Term Goal 3 (Week 1): Pt will ambulate 65f with min assist and LRAD PT Short Term Goal 4 (Week 1): pt will ascend 4 steps with 1 rail and mod assist  Skilled Therapeutic Interventions/Progress Updates:   Pt received supine in bed and agreeable to PT. Supine>sit transfer with supervision on the L and cues for awareness of LUE. Dressing at EOB with min assist for sit<>stand to pull pants to waist and max assist for clothing management. Pt performed facial hygiene at sink with supervision assist/set up assist.   [facility] and no DF wrap x 576fwith min assist overall and cues for step height and safety in turns to prevent inversion ankle sprain. PT applied DF wrap and pt performed gait over level surface 2 x  [id number] min assist and cues for knee flexion in swing to improve symmetry and foot clearance. Dynamic gait training forward/reverse 2 x 13f45fth RW; cues for step length, height, and symmetry. noted improvement in HS activation with dynamic gait training.   BLE NRM to perform standing HS curls, partial squat from flexed position, and hip abduction. Min assist throughout to prevent GR in the L knee and prevent trunkal compensations  Each completed x 10 BLE with seated rest break between bouts.   [facility] to [facility] and all needs met.      Session 2  Pt received sitting in WC and agreeable to PT. [facility]  stand pivot transfer training performed with RW  And supervision assist throughout treatment, min cues for step width on the L when turing to the chair on the L.   Dynamic stranding balance instructed by PT to force improved weight shifting over the LLE using wii Fit. table tilt x 2 and peguin slide x 2. Min assist throughout with moderate cues for improved use of ankle strategy and COM control to improve lateral and forward weight shifting. Min assist also provided to prevent GR on the LLE.   Gait training with RW and DF wrap 40ft76f +180ft.17f assist throughout with tactile cues for improved posture, pelvic rotation on the L, and to prevent GR on the LLE intermittently. Verbal cues for improved knee flexion on the L and step length on the R in swing phases.   Nustep reciprocal movement training BUE/BLE x 3 min. Pt unable to sustain grasp on the L and prformed additional reciprocal movement training with BLE x 5 min.   Supine NMR: Bridge, heel slide, PNF D1 and D2. Clam shell with manual resistance. Each completed with manual resistance x 10 BLE with cues for full ROM and   Patient returned to room and performed stand pivot transfer to toilet with use of Rail and min assist. Pt left sitting on toilet with call bell in reach and all needs met.  Therapy Documentation Precautions:  Precautions Precautions: Fall Restrictions Weight Bearing Restrictions: No Vital Signs: Therapy Vitals Temp: 98.2 F (36.8 C) Temp Source: Oral Pulse Rate: 99 Resp: 14 BP: 110/64 Patient Position (if appropriate): Lying Oxygen Therapy SpO2: 99 % O2 Device: Room Air Pain: denies   Therapy/Group: Individual Therapy  Austin E Tucker 04/25/2020, 5:58 PM  

## 2020-04-26 ENCOUNTER — Inpatient Hospital Stay (HOSPITAL_COMMUNITY): Payer: No Typology Code available for payment source | Admitting: Occupational Therapy

## 2020-04-26 NOTE — Plan of Care (Signed)
°  Problem: Consults Goal: RH BRAIN INJURY PATIENT EDUCATION Description: Description: See Patient Education module for eduction specifics Outcome: Progressing Goal: Skin Care Protocol Initiated - if Braden Score 18 or less Description: If consults are not indicated, leave blank or document N/A Outcome: Progressing Goal: Nutrition Consult-if indicated Outcome: Progressing   Problem: RH BOWEL ELIMINATION Goal: RH STG MANAGE BOWEL WITH ASSISTANCE Description: STG Manage Bowel with mod I  Assistance. Outcome: Progressing Goal: RH STG MANAGE BOWEL W/MEDICATION W/ASSISTANCE Description: STG Manage Bowel with Medication with mod I Assistance. Outcome: Progressing   Problem: RH BLADDER ELIMINATION Goal: RH STG MANAGE BLADDER WITH ASSISTANCE Description: STG Manage Bladder With no Assistance Outcome: Progressing   Problem: RH SKIN INTEGRITY Goal: RH STG ABLE TO PERFORM INCISION/WOUND CARE W/ASSISTANCE Description: STG Able To Perform Incision/Wound Care With  min Assistance. Outcome: Progressing   Problem: RH SAFETY Goal: RH STG ADHERE TO SAFETY PRECAUTIONS W/ASSISTANCE/DEVICE Description: STG Adhere to Safety Precautions With supervision Assistance/Device. Outcome: Progressing Goal: RH STG DECREASED RISK OF FALL WITH ASSISTANCE Description: STG Decreased Risk of Fall With  supervision Assistance. Outcome: Progressing   Problem: RH COGNITION-NURSING Goal: RH STG ANTICIPATES NEEDS/CALLS FOR ASSIST W/ASSIST/CUES Description: STG Anticipates Needs/Calls for Assist With  supervision Assistance/Cues. Outcome: Progressing   Problem: RH PAIN MANAGEMENT Goal: RH STG PAIN MANAGED AT OR BELOW PT'S PAIN GOAL Description: At or below level 3 Outcome: Progressing   Problem: RH KNOWLEDGE DEFICIT BRAIN INJURY Goal: RH STG INCREASE KNOWLEDGE OF SELF CARE AFTER BRAIN INJURY Description: Patient and spouse will be able to manage care at discharge independently using handouts/resources as  references Outcome: Progressing

## 2020-04-26 NOTE — Progress Notes (Signed)
Occupational Therapy Session Note  Patient Details  Name: [name] [name] MRN: [id number] Date of Birth: [birth year]  Today's Date: 04/26/2020 OT Individual Time: 4098-1191 OT Individual Time Calculation (min): 41 min   Short Term Goals: Week 1:  OT Short Term Goal 1 (Week 1): Pt will dress UB with use of hemi techniques with modA. OT Short Term Goal 2 (Week 1): Pt will complete LB dressing with modA at sit to stand level, demonstrating improved dynamic standing balance. OT Short Term Goal 3 (Week 1): Pt will complete toilet transfer with minA with LRAD.  Skilled Therapeutic Interventions/Progress Updates:    Pt greeted in the w/c with no c/o pain. Requesting to shower. Ambulatory transfer to TTB completed with CGA using RW with Lt orthotic component, pt able to manage her Lt arm in/out of orthosis with min cuing. Once shower cap was donned to cover staple site, she bathed while sitting on TTB with Min A to wash Rt arm using HOH with Lt. Pt used lateral and forward leans to complete pericare. Stand pivot<w/c completed with CGA where pt then engaged in dressing tasks sit<stand at the sink. Educated pt to place Rt hand over Lt hand on upper thigh during power ups. CGA for dynamic standing during dressing. She needed assistance to maintain figure 4 position with the Lt LE while donning her sock and also when threading Lt LE into pants. She also needed Min A to don bra, suggested to dtr to bring in sports bra to increase her functional independence. Pt did well with independent recall of hemi techniques. She used one handed techniques when completing oral care after and also when applying deoderent with Min A. Note that she was able to dispense lotion in 2/3 attempts with the Lt hand given CGA! We celebrated!! At end of session pt remained seated in the w/c with all needs within reach and family present. Tx focus placed on NMR, functional transfers, ADL retraining, and family education.   Therapy  Documentation Precautions:  Precautions Precautions: Fall Restrictions Weight Bearing Restrictions: No ADL: ADL Eating: Not assessed Upper Body Bathing: Moderate assistance Where Assessed-Upper Body Bathing: Shower (tub transfer bench) Lower Body Bathing: Moderate assistance Where Assessed-Lower Body Bathing: Shower (tub transfer bench) Upper Body Dressing: Maximal assistance Where Assessed-Upper Body Dressing: Wheelchair Lower Body Dressing: Maximal assistance Where Assessed-Lower Body Dressing: Wheelchair (sit to stand level) Toileting: Not assessed Toilet Transfer: Not assessed Tub/Shower Transfer: Minimal assistance Social research officer, government: Minimal assistance Social research officer, government Method: Education officer, environmental: Radio broadcast assistant, Grab bars:     Therapy/Group: Individual Therapy  Brenon Antosh A Katerra Ingman 04/26/2020, 3:44 PM

## 2020-04-27 ENCOUNTER — Inpatient Hospital Stay (HOSPITAL_COMMUNITY): Payer: No Typology Code available for payment source | Admitting: Occupational Therapy

## 2020-04-27 ENCOUNTER — Inpatient Hospital Stay (HOSPITAL_COMMUNITY): Payer: No Typology Code available for payment source | Admitting: Physical Therapy

## 2020-04-27 ENCOUNTER — Inpatient Hospital Stay: Payer: No Typology Code available for payment source | Attending: Neurosurgery

## 2020-04-27 ENCOUNTER — Encounter (HOSPITAL_COMMUNITY): Payer: No Typology Code available for payment source | Admitting: Psychology

## 2020-04-27 DIAGNOSIS — D329 Benign neoplasm of meninges, unspecified: Secondary | ICD-10-CM

## 2020-04-27 DIAGNOSIS — G8194 Hemiplegia, unspecified affecting left nondominant side: Secondary | ICD-10-CM

## 2020-04-27 NOTE — Progress Notes (Addendum)
Occupational Therapy Session Note  Patient Details  Name: [name] [name] MRN: [id number] Date of Birth: 1956-08-23  Today's Date: 04/27/2020 OT Individual Time: 7282-0601 OT Individual Time Calculation (min): 74 min   Short Term Goals: Week 1:  OT Short Term Goal 1 (Week 1): Pt will dress UB with use of hemi techniques with modA. OT Short Term Goal 2 (Week 1): Pt will complete LB dressing with modA at sit to stand level, demonstrating improved dynamic standing balance. OT Short Term Goal 3 (Week 1): Pt will complete toilet transfer with minA with LRAD.  Skilled Therapeutic Interventions/Progress Updates:    Pt greeted sitting in wc with spouse present after finishing lunch. Pt reported need to go to the bathroom. Pt ambulated from wc to bathroom w/ RW and hand orthoses with min A. Pt transferred onto raised commode over toilet. Pt voided bladder and completed 3/3 toileting steps with min A to get pants over L hip. Pt ambulated to the sink in similar fashion with min cues for RW positioning at the sink. Pt returned to wc, then brought down to therapy gym. L UE NMR with Sci[employer] arm bike. Pt needed ace wrap to maintain L hand grasp on handles. 3 minutes of B UE pushing, then isolated L hand for 2, 1 minute intervals. Pt brought into quadruped position to facilitate weight bearing for L UE NMR. Worked on crawling forward and backward. Progressed to picking up R hand form mat to increase weight bearing through L UE. OT provided support to shoulder and elbow. Pt then propped up on elbow and completed clothes pin activity while in propped sidelying. OT provided pt with soft ellow theraputty and worked on grasp, pinch, and rolling putty on high-low table. Pt returned to bed at end of session with min A stand-pivot. Pt left semi-reclined in bed with bed alarm on, call bell in reach,  And needs met.   Therapy Documentation Precautions:  Precautions Precautions: Fall Restrictions Weight Bearing  Restrictions: No Pain:  denies pain  Therapy/Group: Individual Therapy  Valma Cava 04/27/2020, 1:29 PM

## 2020-04-27 NOTE — Progress Notes (Signed)
Occupational Therapy Session Note  Patient Details  Name: [name] [name] MRN: [id number] Date of Birth: 05/06/1956  Today's Date: 04/27/2020 OT Individual Time: 1052-1202 OT Individual Time Calculation (min): 70 min   Short Term Goals: Week 1:  OT Short Term Goal 1 (Week 1): Pt will dress UB with use of hemi techniques with modA. OT Short Term Goal 2 (Week 1): Pt will complete LB dressing with modA at sit to stand level, demonstrating improved dynamic standing balance. OT Short Term Goal 3 (Week 1): Pt will complete toilet transfer with minA with LRAD.  Skilled Therapeutic Interventions/Progress Updates:    Pt greeted in the w/c with ADL needs met. Wanting to work on her Lt hand. Pt completed multiple grasp/release and reaching tasks during tx, tolerating Saebo estim for 40 minutes with min facilitation for Lt limb guidance during activity. Skin intact when pads were removed, placed over wrist extensors. She also engaged in active assist ROM using the UE ranger to work on proximal strengthening. At close of session provided pt with a Lt half lap tray for her w/c. Pt appreciative. She also verbalized that she needs a new TTB for home and wants f/u OT. Left pt in the w/c with all needs within reach and spouse present.   Saebo Stim One 330 pulse width 35 Hz pulse rate On 8 sec/ off 8 sec Ramp up/ down 2 sec Symmetrical Biphasic wave form  Max intensity 110mA at 500 Ohm load   Therapy Documentation Precautions:  Precautions Precautions: Fall Restrictions Weight Bearing Restrictions: No Pain: no c/o pain during tx   ADL: ADL Eating: Not assessed Upper Body Bathing: Moderate assistance Where Assessed-Upper Body Bathing: Shower (tub transfer bench) Lower Body Bathing: Moderate assistance Where Assessed-Lower Body Bathing: Shower (tub transfer bench) Upper Body Dressing: Maximal assistance Where Assessed-Upper Body Dressing: Wheelchair Lower Body Dressing: Maximal assistance Where  Assessed-Lower Body Dressing: Wheelchair (sit to stand level) Toileting: Not assessed Toilet Transfer: Not assessed Tub/Shower Transfer: Minimal assistance Walk-In Shower Transfer: Minimal assistance Walk-In Shower Transfer Method: Squat pivot Walk-In Shower Equipment: Transfer tub bench, Grab bars      Therapy/Group: Individual Therapy  Michaela A Hoffman 04/27/2020, 12:31 PM  

## 2020-04-27 NOTE — Plan of Care (Signed)
  Problem: Consults Goal: RH BRAIN INJURY PATIENT EDUCATION Description: Description: See Patient Education module for eduction specifics Outcome: Progressing Goal: Skin Care Protocol Initiated - if Braden Score 18 or less Description: If consults are not indicated, leave blank or document N/A Outcome: Progressing Goal: Nutrition Consult-if indicated Outcome: Progressing   Problem: RH BOWEL ELIMINATION Goal: RH STG MANAGE BOWEL WITH ASSISTANCE Description: STG Manage Bowel with mod I  Assistance. Outcome: Progressing Goal: RH STG MANAGE BOWEL W/MEDICATION W/ASSISTANCE Description: STG Manage Bowel with Medication with mod I Assistance. Outcome: Progressing   Problem: RH BLADDER ELIMINATION Goal: RH STG MANAGE BLADDER WITH ASSISTANCE Description: STG Manage Bladder With no Assistance Outcome: Progressing   Problem: RH SKIN INTEGRITY Goal: RH STG ABLE TO PERFORM INCISION/WOUND CARE W/ASSISTANCE Description: STG Able To Perform Incision/Wound Care With  min Assistance. Outcome: Progressing   Problem: RH SAFETY Goal: RH STG ADHERE TO SAFETY PRECAUTIONS W/ASSISTANCE/DEVICE Description: STG Adhere to Safety Precautions With supervision Assistance/Device. Outcome: Progressing Goal: RH STG DECREASED RISK OF FALL WITH ASSISTANCE Description: STG Decreased Risk of Fall With  supervision Assistance. Outcome: Progressing   Problem: RH COGNITION-NURSING Goal: RH STG ANTICIPATES NEEDS/CALLS FOR ASSIST W/ASSIST/CUES Description: STG Anticipates Needs/Calls for Assist With  supervision Assistance/Cues. Outcome: Progressing   Problem: RH PAIN MANAGEMENT Goal: RH STG PAIN MANAGED AT OR BELOW PT'S PAIN GOAL Description: At or below level 3 Outcome: Progressing   Problem: RH KNOWLEDGE DEFICIT BRAIN INJURY Goal: RH STG INCREASE KNOWLEDGE OF SELF CARE AFTER BRAIN INJURY Description: Patient and spouse will be able to manage care at discharge independently using handouts/resources as  references Outcome: Progressing

## 2020-04-27 NOTE — Progress Notes (Signed)
[place] PHYSICAL MEDICINE & REHABILITATION PROGRESS NOTE   Subjective/Complaints: Seen in gym, doing well   ROS: Patient denies CP, SOB, N/V/D    Objective:   No results found. No results for input(s): WBC, HGB, HCT, PLT in the last 72 hours. No results for input(s): NA, K, CL, CO2, GLUCOSE, BUN, CREATININE, CALCIUM in the last 72 hours.  Intake/Output Summary (Last 24 hours) at 04/27/2020 0946 Last data filed at 04/27/2020 0700 Gross per 24 hour  Intake 960 ml  Output --  Net 960 ml     Physical Exam: Vital Signs Blood pressure 108/75, pulse 83, temperature 97.6 F (36.4 C), temperature source Oral, resp. rate 19, height 5\' 3"  (1.6 m), weight 74.5 kg, SpO2 96 %.  General: No acute distress Mood and affect are appropriate Heart: Regular rate and rhythm no rubs murmurs or extra sounds Lungs: Clear to auscultation, breathing unlabored, no rales or wheezes Abdomen: Positive bowel sounds, soft nontender to palpation, nondistended Extremities: No clubbing, cyanosis, or edema Skin: No evidence of breakdown, no evidence of rash  Skin: crain wound CDI with staples Neurologic: Cranial nerves II through XII intact, motor strength is 5/5 in right  deltoid, bicep, tricep, grip, hip flexor, knee extensors, ankle dorsiflexor and plantar flexor 3- left delt , 2- L bi/tri, 3- Left KE, HF, 0/5 L ankle DF--wearing PRAFO   Cerebellar exam cannot perform Left finger to nose to finger as well as heel to shindue to weakness  Musculoskeletal: normal PROM, no swelling   Assessment/Plan: 1. Functional deficits secondary to Right frontal meningioma  which require 3+ hours per day of interdisciplinary therapy in a comprehensive inpatient rehab setting.  Physiatrist is providing close team supervision and 24 hour management of active medical problems listed below.  Physiatrist and rehab team continue to assess barriers to discharge/monitor patient progress toward functional and medical  goals  Care Tool:  Bathing    Body parts bathed by patient: Chest, Left arm, Abdomen, Front perineal area, Right upper leg, Left upper leg, Face, Buttocks, Right lower leg, Left lower leg   Body parts bathed by helper: Right arm     Bathing assist Assist Level: Minimal Assistance - Patient > 75%     Upper Body Dressing/Undressing Upper body dressing   What is the patient wearing?: Bra, Pull over shirt    Upper body assist Assist Level: Minimal Assistance - Patient > 75%    Lower Body Dressing/Undressing Lower body dressing      What is the patient wearing?: Underwear/pull up, Pants     Lower body assist Assist for lower body dressing: Moderate Assistance - Patient 50 - 74%     Toileting Toileting    Toileting assist Assist for toileting: Contact Guard/Touching assist     Transfers Chair/bed transfer  Transfers assist     Chair/bed transfer assist level: Moderate Assistance - Patient 50 - 74%     Locomotion Ambulation   Ambulation assist      Assist level: Maximal Assistance - Patient 25 - 49% Assistive device: Hand held assist Max distance: 25   Walk 10 feet activity   Assist     Assist level: Maximal Assistance - Patient 25 - 49% Assistive device: Hand held assist, No Device   Walk 50 feet activity   Assist Walk 50 feet with 2 turns activity did not occur: Safety/medical concerns         Walk 150 feet activity   Assist Walk 150 feet activity did not  occur: Safety/medical concerns         Walk 10 feet on uneven surface  activity   Assist Walk 10 feet on uneven surfaces activity did not occur: Safety/medical concerns         Wheelchair     Assist   Type of Wheelchair: Manual    Wheelchair assist level: Moderate Assistance - Patient 50 - 74% Max wheelchair distance: 150    Wheelchair 50 feet with 2 turns activity    Assist        Assist Level: Moderate Assistance - Patient 50 - 74%   Wheelchair 150  feet activity     Assist      Assist Level: Moderate Assistance - Patient 50 - 74%   Blood pressure 108/75, pulse 83, temperature 97.6 F (36.4 C), temperature source Oral, resp. rate 19, height 5\' 3"  (1.6 m), weight 74.5 kg, SpO2 96 %.  Medical Problem List and Plan: 1.  Left side weakness secondary to recurrent meningioma status post right frontal craniotomy resection 04/16/2020- previous craniotomy for meningioma was 2014             -patient may shower if uses shower cap until staples out              -ELOS/Goals: 2-2.5 weeks- goals Mod I to supervision 2.  Antithrombotics: -DVT/anticoagulation: Subcutaneous heparin initiated 04/19/2020             -antiplatelet therapy: N/A 3. Pain Management: Oxycodone as needed 4. Mood: Provide emotional support             -antipsychotic agents: N/A 5. Neuropsych: This patient is capable of making decisions on her own behalf. 6. Skin/Wound Care: Routine skin checks, should be ready for staple removal this  week    7. Fluids/Electrolytes/Nutrition: Routine in and outs Repeat BMET nl 8.  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  Keppra 1500 mg twice daily 9.  Hypertension.  Toprol 12.5 mg daily.  Monitor with increased mobility Vitals:   04/26/20 1918 04/27/20 0527  BP: 108/63 108/75  Pulse: 90 83  Resp: 16 19  Temp: 98.9 F (37.2 C) 97.6 F (36.4 C)  SpO2: 97% 96%  Controlled 6/14 10.  Constipation.  Currently on MiraLAX as needed.  Adjust bowel program as needed 11.  Hyperlipidemia.  Lipitor 12.  History of tobacco use.  Provide counseling 13. Previous craniotomy with mild L hemiparesis since 2014 for meningioma resection   14.  Leukocytosis without fever will monitor , likely due to decadron LOS: 6 days A FACE TO FACE EVALUATION WAS PERFORMED  Charlett Blake 04/27/2020, 9:46 AM

## 2020-04-27 NOTE — Consult Note (Signed)
Neuropsychological Consultation   Patient:   [name] [name]   DOB:   1956/09/29  MR Number:  [id number]  Location:  [facility] [facility] [id number] [place] [address] Dept: [place]: [phone]           Date of Service:   04/27/2020  Start Time:   10 AM End Time:   11 AM  Provider/Observer:  Ilean Skill, Psy.D.       Clinical Neuropsychologist       Billing Code/Service: [phone]  Chief Complaint:    [name] [name] is a 64 year old female who has a prior medical history of hyperlipidemia, hypertension, history of tobacco abuse, craniotomy for tumor excision/meningioma on 09/10/2013.  The patient has been living independently at home with her husband and working as a Marine scientist.  The patient presented on 04/12/2020 with left hemiparesis and seizure.  Patient had been no antiepileptic medications prior.  MRI showed recurrent meningioma along the right frontoparietal convexity and falx with some contralateral extension.  Compression and possible invasion of the adjacent sagittal sinus.  Patient underwent stereotactic right frontal craniotomy resection for meningioma on 04/16/2020.  Patient remained on Keppra for seizure prophylaxis as well as Decadron protocol.  Patient has continued with left hemiparesis following surgery but has made some improvements in motor functioning during rehab stay.  No other cognitive complaints are noted by the patient.  Reason for Service:  The patient was referred for neuropsychological consultation due to coping and adjustment following craniotomy for resection of meningioma.  Below is the HPI for the current admission.  HPI: [name] [name] is a 6 year old right-handed female with history of hyperlipidemia, hypertension, history of tobacco abuse, craniotomy for tumor excision/meningioma 09/10/2013.  Per chart review lives with husband.  Independent working as a Marine scientist.   Two-level home bed and bath on main level 5 steps to entry and good family support.  Presented 04/12/2020 with left hemiparesis and seizure.  Patient on no antiepileptic medication prior to admission.  Admission chemistries unremarkable except glucose mildly elevated 139.  EEG identified no seizure.  MRI showed recurrent meningioma along the right frontoparietal convexity and falx with some contralateral extension.  Compression and possible invasion of the adjacent superior sagittal sinus which remain patent.  Patient underwent stereotactic right frontal craniotomy resection of meningioma 04/16/2020 per Dr. Kathyrn Sheriff.  Maintained on Keppra for seizure prophylaxis as well as Decadron protocol.  Subcutaneous heparin added for DVT prophylaxis 04/19/2020.  Patient is tolerating a regular diet.  Therapy evaluations completed and patient was admitted for a comprehensive rehab program.  Current Status:  Upon entering the room, the patient was sitting in her wheelchair and was well oriented with good mental status.  The patient was aware of what it happened with the surgery and was able to give a good history regarding her previous meningioma in the same area of the brain in 2014.  The patient presented with rather flat affect reported that she was highly motivated to work very hard in therapies and had a busy therapy schedule.  The patient reports that she is working on trying to regain as much of her motor functioning as possible.  She reports that she could not identify any other cognitive changes beyond residual left-sided motor deficits.  Behavioral Observation: [name] [name]  presents as a 50 y.o.-year-old Right African American Female who appeared her stated age. her dress was Appropriate and she was Well Groomed and her manners  were Appropriate to the situation.  her participation was indicative of Appropriate and Attentive behaviors.  There were any physical disabilities noted.  she displayed an appropriate level  of cooperation and motivation.     Interactions:    Active Appropriate and Attentive  Attention:   abnormal and attention span and concentration were age appropriate  Memory:   within normal limits; recent and remote memory intact  Visuo-spatial:  not examined  Speech (Volume):  normal  Speech:   normal; normal  Thought Process:  Coherent and Relevant  Though Content:  WNL; not suicidal and not homicidal  Orientation:   person, place, time/date and situation  Judgment:   Good  Planning:   Fair  Affect:    Lethargic  Mood:    Dysphoric  Insight:   Good  Intelligence:   normal  Medical History:   Past Medical History:  Diagnosis Date  . Bruises easily   . Carotid artery occlusion   . Dizziness   . Headache(784.0)   . Hyperlipidemia    takes Atorvastatin daily  . PONV (postoperative nausea and vomiting)   . Stroke ([place])   . TIA (transient ischemic attack)        Abuse/Trauma History: The patient has had previous craniotomy due to meningioma.  Psychiatric History:  No prior psychiatric history.  Family Med/Psych History:  Family History  Problem Relation Age of Onset  . Cancer Father        Prostate  . Deep vein thrombosis Father     Impression/DX:   [name] [name] is a 64 year old female who has a prior medical history of hyperlipidemia, hypertension, history of tobacco abuse, craniotomy for tumor excision/meningioma on 09/10/2013.  The patient has been living independently at home with her husband and working as a Marine scientist.  The patient presented on 04/12/2020 with left hemiparesis and seizure.  Patient had been no antiepileptic medications prior.  MRI showed recurrent meningioma along the right frontoparietal convexity and falx with some contralateral extension.  Compression and possible invasion of the adjacent sagittal sinus.  Patient underwent stereotactic right frontal craniotomy resection for meningioma on 04/16/2020.  Patient remained on Keppra for seizure  prophylaxis as well as Decadron protocol.  Patient has continued with left hemiparesis following surgery but has made some improvements in motor functioning during rehab stay.  No other cognitive complaints are noted by the patient.  Upon entering the room, the patient was sitting in her wheelchair and was well oriented with good mental status.  The patient was aware of what it happened with the surgery and was able to give a good history regarding her previous meningioma in the same area of the brain in 2014.  The patient presented with rather flat affect reported that she was highly motivated to work very hard in therapies and had a busy therapy schedule.  The patient reports that she is working on trying to regain as much of her motor functioning as possible.  She reports that she could not identify any other cognitive changes beyond residual left-sided motor deficits.   Disposition/Plan:  The patient appears to be coping well given the circumstances and denies any other significant cognitive changes post surgery.  She was rather flat affect but denied any significant depression or anxiety type symptoms.  The patient reports that she remains highly motivated and has been working hard in her therapeutic efforts and wants to regain as much motor function as possible.  She is aware of the timelines as far  as recovery.  Diagnosis:    Right hemiparesis         Electronically Signed   _______________________ Ilean Skill, Psy.D.

## 2020-04-27 NOTE — Progress Notes (Signed)
Physical Therapy Session Note  Patient Details  Name: [name] [name] MRN: [id number] Date of Birth: 06-06-1956  Today's Date: 04/27/2020 PT Individual Time: 4098-1191 PT Individual Time Calculation (min): 70 min   Short Term Goals: Week 1:  PT Short Term Goal 1 (Week 1): Pt will perform bed mobility with supervision assist PT Short Term Goal 2 (Week 1): Pt will transfer to and from [facility] with min assist consistently PT Short Term Goal 3 (Week 1): Pt will ambulate 68f with min assist and LRAD PT Short Term Goal 4 (Week 1): pt will ascend 4 steps with 1 rail and mod assist  Skilled Therapeutic Interventions/Progress Updates: Pt presented in w/c agreeable to therapy. Pt denies pain during session. Pt requesting to get dressed prior to leaving room. PTA donned TED hose total A and pt performed LB/UB dressing with modA. Pt then performed oral hygiene and washed face with supervision. Pt transported to rehab gym for energy conservation and participated in STS x 5 from level tile then with RLE on 4in step for increased recruitment of LLE. Pt also participated in x 2 bouts of horseshoes with RLE on 4in step for increased wt shifting of LLE. Pt also participated in seated and standing hamstring curls 2 x10 for forced use of LLE and increased recruitment of hamstrings.Performed LAQ x 10 with 3 sec hold and cues to maintain erect posture to minimize compensation. PTA then trialed use of toe up brace with pt ambulating 275fx 2 with minA. PTA placed shoe cover on second trial with pt demonstrating improved sequencing and decreased hip flexion as compensatory movement. Pt then ambulated 5037f 2 with brace and toe cover with minA and improved sequencing. Pt with x 3 bouts of L knee instability during ambulation. Pt returned to w/c and transported back to room at end of session. Pt remained in w/c with belt alarm placed, call bell within reach and needs met.      Therapy Documentation Precautions:   Precautions Precautions: Fall Restrictions Weight Bearing Restrictions: No General:   Vital Signs: Therapy Vitals Temp: 98.6 F (37 C) Temp Source: Oral Pulse Rate: 90 Resp: 14 BP: 107/60 Patient Position (if appropriate): Lying Oxygen Therapy SpO2: 98 % O2 Device: Room Air    Therapy/Group: Individual Therapy  Avaiah Stempel  Latrise Bowland, PTA  04/27/2020, 4:24 PM

## 2020-04-28 ENCOUNTER — Other Ambulatory Visit: Payer: Self-pay | Admitting: Radiation Therapy

## 2020-04-28 ENCOUNTER — Inpatient Hospital Stay (HOSPITAL_COMMUNITY): Payer: No Typology Code available for payment source | Admitting: Physical Therapy

## 2020-04-28 ENCOUNTER — Inpatient Hospital Stay (HOSPITAL_COMMUNITY): Payer: No Typology Code available for payment source | Admitting: Occupational Therapy

## 2020-04-28 DIAGNOSIS — D329 Benign neoplasm of meninges, unspecified: Secondary | ICD-10-CM

## 2020-04-28 MED ORDER — DEXAMETHASONE 2 MG PO TABS
3.0000 mg | ORAL_TABLET | Freq: Every day | ORAL | Status: DC
  Administered 2020-04-29 – 2020-04-30 (×2): 3 mg via ORAL
  Filled 2020-04-28 (×2): qty 2

## 2020-04-28 MED ORDER — BISACODYL 10 MG RE SUPP
10.0000 mg | Freq: Once | RECTAL | Status: AC
  Administered 2020-04-28: 10 mg via RECTAL
  Filled 2020-04-28: qty 1

## 2020-04-28 NOTE — Plan of Care (Signed)
  Problem: Consults Goal: RH BRAIN INJURY PATIENT EDUCATION Description: Description: See Patient Education module for eduction specifics Outcome: Progressing Goal: Skin Care Protocol Initiated - if Braden Score 18 or less Description: If consults are not indicated, leave blank or document N/A Outcome: Progressing Goal: Nutrition Consult-if indicated Outcome: Progressing   Problem: RH BOWEL ELIMINATION Goal: RH STG MANAGE BOWEL WITH ASSISTANCE Description: STG Manage Bowel with mod I  Assistance. Outcome: Progressing Goal: RH STG MANAGE BOWEL W/MEDICATION W/ASSISTANCE Description: STG Manage Bowel with Medication with mod I Assistance. Outcome: Progressing   Problem: RH BLADDER ELIMINATION Goal: RH STG MANAGE BLADDER WITH ASSISTANCE Description: STG Manage Bladder With no Assistance Outcome: Progressing   Problem: RH SKIN INTEGRITY Goal: RH STG ABLE TO PERFORM INCISION/WOUND CARE W/ASSISTANCE Description: STG Able To Perform Incision/Wound Care With  min Assistance. Outcome: Progressing   Problem: RH SAFETY Goal: RH STG ADHERE TO SAFETY PRECAUTIONS W/ASSISTANCE/DEVICE Description: STG Adhere to Safety Precautions With supervision Assistance/Device. Outcome: Progressing Goal: RH STG DECREASED RISK OF FALL WITH ASSISTANCE Description: STG Decreased Risk of Fall With  supervision Assistance. Outcome: Progressing   Problem: RH COGNITION-NURSING Goal: RH STG ANTICIPATES NEEDS/CALLS FOR ASSIST W/ASSIST/CUES Description: STG Anticipates Needs/Calls for Assist With  supervision Assistance/Cues. Outcome: Progressing   Problem: RH PAIN MANAGEMENT Goal: RH STG PAIN MANAGED AT OR BELOW PT'S PAIN GOAL Description: At or below level 3 Outcome: Progressing   Problem: RH KNOWLEDGE DEFICIT BRAIN INJURY Goal: RH STG INCREASE KNOWLEDGE OF SELF CARE AFTER BRAIN INJURY Description: Patient and spouse will be able to manage care at discharge independently using handouts/resources as  references Outcome: Progressing

## 2020-04-28 NOTE — Progress Notes (Signed)
Physical Therapy Session Note  Patient Details  Name: [name] [name] MRN: [id number] Date of Birth: 02/12/1956  Today's Date: 04/28/2020 PT Individual Time: 0805-0915 PT Individual Time Calculation (min): 70 min   Short Term Goals: Week 1:  PT Short Term Goal 1 (Week 1): Pt will perform bed mobility with supervision assist PT Short Term Goal 2 (Week 1): Pt will transfer to and from WC with min assist consistently PT Short Term Goal 3 (Week 1): Pt will ambulate 50ft with min assist and LRAD PT Short Term Goal 4 (Week 1): pt will ascend 4 steps with 1 rail and mod assist  Skilled Therapeutic Interventions/Progress Updates: Pt presented in w/c with nsg present agreeable to therapy. Pt states some soreness in arm, received pain meds from nsg. PTA threaded pants for time management and pt performed STS with CGA and RW to pull pants over hips. PTA donned socks shoes total A for time management. Pt transported to rehab gym and performed stand pivot to mat with CGA. Participated in LE NMR activities for forced use of LLE. Pt performed SAQ with 3sec hold 2 x 10, heel slides with hip flexion 2 x 10, SLR 2 x 10, hamstring pulls with red physioball 2 x 10, bridges on red physioball 2 x 10, and SL bridge x 5. Pt returned to sitting at EOB with CGA and verbal cues for use of LUE (pushing thorough  elbow). Pt participated in gait training 30ft x 2 with minA and x 1 occurrence of L knee buckling. Used toe up on first 30ft with noted circumduction for compensatory pattern which greatly improved once shoe cover placed. Pt then transported to day room and participated in Cybex Kinetron 50cm/sec with increased DF to promote glute/hamstring activation 2 x 3 min ea. Pt transported back to room at end of session and remained in w/c with belt alarm on, call bell within reach and needs met.      Therapy Documentation Precautions:  Precautions Precautions: Fall Restrictions Weight Bearing Restrictions: No General:    Vital Signs:   Pain:   Mobility:   Locomotion :    Trunk/Postural Assessment :    Balance:   Exercises:   Other Treatments:      Therapy/Group: Individual Therapy  Rosita DeChalus 04/28/2020, 4:10 PM  

## 2020-04-28 NOTE — Progress Notes (Signed)
[facility] PHYSICAL MEDICINE & REHABILITATION PROGRESS NOTE   Subjective/Complaints:  No issues overnite   ROS: Patient denies CP, SOB, N/V/D    Objective:   No results found. No results for input(s): WBC, HGB, HCT, PLT in the last 72 hours. No results for input(s): NA, K, CL, CO2, GLUCOSE, BUN, CREATININE, CALCIUM in the last 72 hours.  Intake/Output Summary (Last 24 hours) at 04/28/2020 0846 Last data filed at 04/27/2020 1700 Gross per 24 hour  Intake 360 ml  Output --  Net 360 ml     Physical Exam: Vital Signs Blood pressure 123/73, pulse 81, temperature 98 F (36.7 C), resp. rate 14, height 5\' 3"  (1.6 m), weight 74.5 kg, SpO2 98 %.   General: No acute distress Mood and affect are appropriate Heart: Regular rate and rhythm no rubs murmurs or extra sounds Lungs: Clear to auscultation, breathing unlabored, no rales or wheezes Abdomen: Positive bowel sounds, soft nontender to palpation, nondistended Extremities: No clubbing, cyanosis, or edema Skin: No evidence of breakdown, no evidence of rash   Skin: crain wound CDI with staples Neurologic: Cranial nerves II through XII intact, motor strength is 5/5 in right  deltoid, bicep, tricep, grip, hip flexor, knee extensors, ankle dorsiflexor and plantar flexor 3- left delt , 2- L bi/tri, 3- Left KE, HF, 0/5 L ankle DF--wearing PRAFO   Cerebellar exam cannot perform Left finger to nose to finger as well as heel to shindue to weakness  Musculoskeletal: normal PROM, no swelling   Assessment/Plan: 1. Functional deficits secondary to Right frontal meningioma  which require 3+ hours per day of interdisciplinary therapy in a comprehensive inpatient rehab setting.  Physiatrist is providing close team supervision and 24 hour management of active medical problems listed below.  Physiatrist and rehab team continue to assess barriers to discharge/monitor patient progress toward functional and medical goals  Care Tool:  Bathing     Body parts bathed by patient: Chest, Left arm, Abdomen, Front perineal area, Right upper leg, Left upper leg, Face, Buttocks, Right lower leg, Left lower leg   Body parts bathed by helper: Right arm     Bathing assist Assist Level: Minimal Assistance - Patient > 75%     Upper Body Dressing/Undressing Upper body dressing   What is the patient wearing?: Bra, Pull over shirt    Upper body assist Assist Level: Minimal Assistance - Patient > 75%    Lower Body Dressing/Undressing Lower body dressing      What is the patient wearing?: Underwear/pull up, Pants     Lower body assist Assist for lower body dressing: Moderate Assistance - Patient 50 - 74%     Toileting Toileting    Toileting assist Assist for toileting: Contact Guard/Touching assist     Transfers Chair/bed transfer  Transfers assist     Chair/bed transfer assist level: Moderate Assistance - Patient 50 - 74%     Locomotion Ambulation   Ambulation assist      Assist level: Maximal Assistance - Patient 25 - 49% Assistive device: Hand held assist Max distance: 25   Walk 10 feet activity   Assist     Assist level: Maximal Assistance - Patient 25 - 49% Assistive device: Hand held assist, No Device   Walk 50 feet activity   Assist Walk 50 feet with 2 turns activity did not occur: Safety/medical concerns         Walk 150 feet activity   Assist Walk 150 feet activity did not occur: Safety/medical  concerns         Walk 10 feet on uneven surface  activity   Assist Walk 10 feet on uneven surfaces activity did not occur: Safety/medical concerns         Wheelchair     Assist   Type of Wheelchair: Manual    Wheelchair assist level: Moderate Assistance - Patient 50 - 74% Max wheelchair distance: 150    Wheelchair 50 feet with 2 turns activity    Assist        Assist Level: Moderate Assistance - Patient 50 - 74%   Wheelchair 150 feet activity     Assist       Assist Level: Moderate Assistance - Patient 50 - 74%   Blood pressure 123/73, pulse 81, temperature 98 F (36.7 C), resp. rate 14, height 5\' 3"  (1.6 m), weight 74.5 kg, SpO2 98 %.  Medical Problem List and Plan: 1.  Left side weakness secondary to recurrent meningioma status post right frontal craniotomy resection 04/16/2020- previous craniotomy for meningioma was 2014             -patient may shower if uses shower cap until staples out              -ELOS/Goals:team conf in am  2.  Antithrombotics: -DVT/anticoagulation: Subcutaneous heparin initiated 04/19/2020             -antiplatelet therapy: N/A 3. Pain Management: Oxycodone as needed 4. Mood: Provide emotional support             -antipsychotic agents: N/A 5. Neuropsych: This patient is capable of making decisions on her own behalf. 6. Skin/Wound Care: Routine skin checks, should be ready for staple removal this  week  Almost 2wks post op   7. Fluids/Electrolytes/Nutrition: Routine in and outs Repeat BMET nl 8.  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  Keppra 1500 mg twice daily 9.  Hypertension.  Toprol 12.5 mg daily.  Monitor with increased mobility Vitals:   04/28/20 0420 04/28/20 0810  BP: 105/62 123/73  Pulse: 81   Resp: 14   Temp: 98 F (36.7 C)   SpO2: 98%   Controlled 6/15 10.  Constipation.  Currently on MiraLAX as needed.  Adjust bowel program as needed, last recorded BMs 2 d ago  20.  Hyperlipidemia.  Lipitor 12.  History of tobacco use.  Provide counseling 13. Previous craniotomy with mild L hemiparesis since 2014 for meningioma resection   14.  Leukocytosis without fever will monitor , likely due to decadron, will start to wean Reduce to 3mg  tomorrow  LOS: 7 days A FACE TO FACE EVALUATION WAS PERFORMED  Charlett Blake 04/28/2020, 8:46 AM

## 2020-04-28 NOTE — Progress Notes (Signed)
Occupational Therapy Session Note  Patient Details  Name: [name] [name] MRN: [id number] Date of Birth: 1956-10-13  Today's Date: 04/28/2020 OT Individual Time: 3664-4034 and 7425-9563 OT Individual Time Calculation (min): 54 min and 47 minutes 30 missed minutes secondary to fatigue  Short Term Goals: Week 1:  OT Short Term Goal 1 (Week 1): Pt will dress UB with use of hemi techniques with modA. OT Short Term Goal 2 (Week 1): Pt will complete LB dressing with modA at sit to stand level, demonstrating improved dynamic standing balance. OT Short Term Goal 3 (Week 1): Pt will complete toilet transfer with minA with LRAD.   Skilled Therapeutic Interventions/Progress Updates:    Session 1: Upon entering the room, pt seated in wheelchair awaiting OT arrival. OT assisted pt via wheelchair to ADL apartment for time management. Pt with no c/o pain but reports soreness in L UE from yesterdays sessions. Focus of session on simulated mobility and functional transfers within home environment. Pt ambulating with RW for transfer onto standard bed based on side pt sleeps on. Sit <>supine with CGA for safety. Pt then ambulating on carpeted surface for transfer into low recliner chair and soft sofa with min guard - min A secondary to low height. Pt reports these surfaces are both lower than her's at home. Pt returning to wheelchair with CGA and min cuing at beginning of session only to fasten/unfasten L hand orthotic RW. OT assisted pt back to wheelchair and started energy conservation education with paper handouts provided for pt to read when able. Dycem placed on lap tray to keep L UE in neutral position. Pt remains in wheelchair with chair alarm belt donned and call bell within reach.   Session 2: Upon entering the room, pt seated in wheelchair with RN present and giving medications. Pt agreeable to shower this session but reports extreme fatigue. Pt ambulating to bathroom with RW and min A into bathroom. Pt  seated on commode to doff clothing items and then transferred onto TTB with min A. OT placing  bath mitt onto L UE and pt utilized R UE for hand over hand assistance to wash body parts. Pt drying self and then ambulating to wheelchair with min A and cuing needed for safety with L ankle/foot. Pt seated in wheelchair and needing assistance to hold LEs into figure four position to apply lotion and thread underwear. Pt standing with  Min A for balance and able to pull over B hips. One handed technique utilized to don B socks as well. Pt donning hospital gown and requesting to return to bed secondary to fatigue.Min A stand pivot transfer. Sit >supine with min guard and L UE positioned for safety. Call bell and all needed items within reach. Bed alarm activated.   Therapy Documentation Precautions:  Precautions Precautions: Fall Restrictions Weight Bearing Restrictions: No General:   Vital Signs: Therapy Vitals BP: 123/73  ADL: ADL Eating: Not assessed Upper Body Bathing: Moderate assistance Where Assessed-Upper Body Bathing: Shower (tub transfer bench) Lower Body Bathing: Moderate assistance Where Assessed-Lower Body Bathing: Shower (tub transfer bench) Upper Body Dressing: Maximal assistance Where Assessed-Upper Body Dressing: Wheelchair Lower Body Dressing: Maximal assistance Where Assessed-Lower Body Dressing: Wheelchair (sit to stand level) Toileting: Not assessed Toilet Transfer: Not assessed Tub/Shower Transfer: Minimal assistance Social research officer, government: Minimal assistance Social research officer, government Method: Education officer, environmental: Radio broadcast assistant, Systems analyst    Praxis   Exercises:   Other Treatments:  Therapy/Group: Individual Therapy  Gypsy Decant 04/28/2020, 11:15 AM

## 2020-04-29 ENCOUNTER — Inpatient Hospital Stay (HOSPITAL_COMMUNITY): Payer: No Typology Code available for payment source | Admitting: Physical Therapy

## 2020-04-29 ENCOUNTER — Inpatient Hospital Stay (HOSPITAL_COMMUNITY): Payer: No Typology Code available for payment source | Admitting: Occupational Therapy

## 2020-04-29 MED ORDER — SENNA 8.6 MG PO TABS
1.0000 | ORAL_TABLET | Freq: Every day | ORAL | Status: DC
  Administered 2020-04-29 – 2020-05-06 (×8): 8.6 mg via ORAL
  Filled 2020-04-29 (×8): qty 1

## 2020-04-29 NOTE — Progress Notes (Signed)
Physical Therapy Session Note  Patient Details  Name: [name] [name] MRN: [id number] Date of Birth: 01-01-1956  Today's Date: 04/29/2020 PT Individual Time: 0900-1000 PT Individual Time Calculation (min): 60 min   Short Term Goals: Week 1:  PT Short Term Goal 1 (Week 1): Pt will perform bed mobility with supervision assist PT Short Term Goal 2 (Week 1): Pt will transfer to and from [facility] with min assist consistently PT Short Term Goal 3 (Week 1): Pt will ambulate 73f with min assist and LRAD PT Short Term Goal 4 (Week 1): pt will ascend 4 steps with 1 rail and mod assist  Skilled Therapeutic Interventions/Progress Updates: Pt presented in w/c with nsg present administering meds and agreeable to therapy. Pt denies pain during session. Pt transported to rehab gym for energy conservation. Performed stand pivot to mat CGA. PTA attempted to trial AFO with inversion support however none fit appropriately this used ace bandage with increased inversion support. Pt then ambulated x30 ft with RW and minA with mild circumduction noted due to toe catching. PTA then applied shoe cover with pt ambulating an additional 37fwith CGA and improved swing through. Pt then ambulated an additional 6010f 2 with pt attempting to kick yoga block to emphasize forward swing vs mild circumduction. Pt did require seated rests between bouts due to fatigue. Pt then transported to stairs and participated in ascending/descending x 4 steps with single raill and minA. Pt was able to achieve enough hip flexion with LLE to clear step consistently however noted mild L knee instability when descending stairs. Pt then transported to day room and performed stand pivot to NuStep. Pt participated in NuStep L3 x 5 min for general conditioning with BLE and RUE only. Pt was able to maintain approx 40 SPM and indicated 5/10 on mBORG. Pt then transported back to room and performed stand pivot transfer to bed per pt request. Pt performed sit to  supine with supervision and use of bed rail to flat bed with increased time. Pt left in bed with call bell within reach, bed alarm on, and needs met.      Therapy Documentation Precautions:  Precautions Precautions: Fall Restrictions Weight Bearing Restrictions: No General:   Vital Signs: Therapy Vitals Temp: 98.3 F (36.8 C) Temp Source: Oral Pulse Rate: 98 Resp: 17 BP: 112/63 Patient Position (if appropriate): Lying Oxygen Therapy SpO2: 96 % O2 Device: Room Air Pain:   Mobility:   Locomotion :    Trunk/Postural Assessment :    Balance:   Exercises:   Other Treatments:      Therapy/Group: Individual Therapy  Natayla Cadenhead 04/29/2020, 4:24 PM

## 2020-04-29 NOTE — Progress Notes (Signed)
Patient ID: [name] [name], female   DOB: [birth year], 64 y.o.   MRN: [id number]  Team Conference Report to Patient/Family  Team Conference discussion was reviewed with the patient and caregiver, including goals, any changes in plan of care and target discharge date.  Patient and caregiver express understanding and are in agreement.  The patient has a target discharge date of 05/07/20.  Dyanne Iha 04/29/2020, 2:12 PM

## 2020-04-29 NOTE — Progress Notes (Signed)
Physical Therapy Session Note  Patient Details  Name: [name] [name] MRN: [id number] Date of Birth: [birth year]  Today's Date: 04/29/2020 PT Individual Time: 1115-1200 AND 1420-1445 PT Individual Time Calculation (min): 45 min and 25 min   Short Term Goals: Week 1:  PT Short Term Goal 1 (Week 1): Pt will perform bed mobility with supervision assist PT Short Term Goal 2 (Week 1): Pt will transfer to and from [facility] with min assist consistently PT Short Term Goal 3 (Week 1): Pt will ambulate 4f with min assist and LRAD PT Short Term Goal 4 (Week 1): pt will ascend 4 steps with 1 rail and mod assist   Skilled Therapeutic Interventions/Progress Updates:  Session 1  Pt received supine in bed and agreeable to PT. Supine>sit transfer with supervision assist for safety. PT applied shoes and DF wrap to the LLE.stand pivot [facility] and cues for gait pattern. [facility] supervision A-min assist with cues for step height on the L and safety in turns x 2066f Pt performed sit<>stand transfer to mat table with supervision assist. Supine NMR: bridge x 10, SLR x 10, hip abduction x 12, PNF lumbar rotation stabilizing reversals x 10 with 3 sec hold.  Pt returned to room and performed ambulatory transfer to bed with supervision. Sit>supine completed with supervision assist and left supine in bed with call bell in reach and all needs met.    Session 2.  Pt received supine in bed and agreeable to PT. Supine>sit transfer with superivsion assist and cues for use of LUE. Pt transported to rehab gym. Trial PLF AFO with gait training x [id number] CGA-supervision assist. mild foot drag on the LLE intermittently. Patient returned to room and [facility] and all needs met.              Therapy Documentation Precautions:  Precautions Precautions: Fall Restrictions Weight Bearing Restrictions:  No Pain:    Denies    Therapy/Group: Individual Therapy  AustLorie Phenix6/2021, 7:01 PM

## 2020-04-29 NOTE — Patient Care Conference (Signed)
Inpatient RehabilitationTeam Conference and Plan of Care Update Date: 04/29/2020   Time: 1:16 PM    Patient Name: [name] [name]      Medical Record Number: [id number]  Date of Birth: [birth year] Sex: Female         Room/Bed: 4W25C/4W25C-01 Payor Info: Payor: [place] / Plan: [employer] COMM PPO / Product Type: *No Product type* /    Admit Date/Time:  04/21/2020  2:36 PM  Primary Diagnosis:  Left hemiparesis [facility])  Patient Active Problem List   Diagnosis Date Noted  . Meningioma ([place]) 04/21/2020  . Left hemiparesis ([place]) 04/21/2020  . Dyslipidemia   . Essential hypertension   . History of tobacco abuse   . History of meningioma   . Leucocytosis   . Acute blood loss anemia   . Brain tumor ([place]) 04/13/2020  . Seizure ([place]) 04/12/2020  . Occlusion and stenosis of carotid artery without mention of cerebral infarction 08/10/2012  . Fibromuscular dysplasia ([place]) 08/10/2012    Expected Discharge Date: Expected Discharge Date: 05/07/20  Team Members Present: Physician leading conference: Dr. Alysia Penna Care Coodinator Present: Nestor Lewandowsky, RN, BSN, CRRN;Christina Sampson Goon, BSW Nurse Present: Rosita Fire, RN PT Present: Barrie Folk, PT;Rosita Dechalus, PTA OT Present: Darleen Crocker, OT PPS Coordinator present : Ileana Ladd, PT     Current Status/Progress Goal Weekly Team Focus  Bowel/Bladder   Pt is continent of bowel and bladder.  To remain continent.  Assess toileting needs often.   Swallow/Nutrition/ Hydration             ADL's   CGA ambulatory bathroom transfers using RW, Min A bathing at shower level, Min A UB dressing, Mod A LB dressing, CGA toileting  Supervision  ADL retraining, dynamic standing balance, Lt NMR, functional transfers, family education   Mobility   CGA sit to stand transfers, ambulation with RW and dorsiflexion assist min to mod A with pt demonstrating decreased hip flexion, hamstring strength, and no dorsiflexion when advancing  the foot. Pt currently trialing toe up for dorsiflexion assist  supervision assist overall  L NMR, balance, LLE strengthening, gait,  d/c planning   Communication             Safety/Cognition/ Behavioral Observations            Pain   No complaints of pain.  To remain pain free.  Assess pain q shift or prn.   Skin   Surgical incision to head, has staples and they are intact. Some ecchymosis to the left/right side of lower abdomen.  To promote healing and prevent further breakdown.  Assess skin q shift or prn.    Rehab Goals Patient on target to meet rehab goals: Yes Rehab Goals Revised: patient on target with current goals *See Care Plan and progress notes for long and short-term goals.     Barriers to Discharge  Current Status/Progress Possible Resolutions Date Resolved   Nursing                  PT                    OT                  SLP                Care Coordinator     on target          Discharge Planning/Teaching Needs:  Plans to discharge home  Will schedule education with spouse daughter or sister if reccommended   Team Discussion:  Some tachycardia noted, getting good movement in arm ? Toe up/AFO. Currently min assist without shoes for B+D and CGA for transfers.  Revisions to Treatment Plan:  On target to meet supervision goals.    Medical Summary Current Status: Incision healing well, Weekly Focus/Goal: d/c 1/2 staples today 1/2 in am  Barriers to Discharge: Other (comments)  Barriers to Discharge Comments: Left foot drop interfering with ambulation adn safety Possible Resolutions to Barriers: AFO consult this week   Continued Need for Acute Rehabilitation Level of Care: The patient requires daily medical management by a physician with specialized training in physical medicine and rehabilitation for the following reasons: Direction of a multidisciplinary physical rehabilitation program to maximize functional independence : Yes Medical management of  patient stability for increased activity during participation in an intensive rehabilitation regime.: Yes Analysis of laboratory values and/or radiology reports with any subsequent need for medication adjustment and/or medical intervention. : Yes   I attest that I was present, lead the team conference, and concur with the assessment and plan of the team.   Dorien Chihuahua B 04/29/2020, 1:16 PM

## 2020-04-29 NOTE — Progress Notes (Addendum)
[place] PHYSICAL MEDICINE & REHABILITATION PROGRESS NOTE   Subjective/Complaints:  Patient states that she has received a call from neurosurgery office asking about discharge date so radiation therapy can be planned.  ROS: Patient denies CP, SOB, N/V/D    Objective:   No results found. No results for input(s): WBC, HGB, HCT, PLT in the last 72 hours. No results for input(s): NA, K, CL, CO2, GLUCOSE, BUN, CREATININE, CALCIUM in the last 72 hours.  Intake/Output Summary (Last 24 hours) at 04/29/2020 1023 Last data filed at 04/29/2020 0849 Gross per 24 hour  Intake 598 ml  Output --  Net 598 ml     Physical Exam: Vital Signs Blood pressure 115/71, pulse (!) 102, temperature 98.8 F (37.1 C), temperature source Oral, resp. rate 16, height _0  (1.6 m), weight 74.5 kg, SpO2 98 %.   General: No acute distress Mood and affect are appropriate Heart: Regular rate and rhythm no rubs murmurs or extra sounds Lungs: Clear to auscultation, breathing unlabored, no rales or wheezes Abdomen: Positive bowel sounds, soft nontender to palpation, nondistended Extremities: No clubbing, cyanosis, or edema Skin: No evidence of breakdown, no evidence of rash   Skin: crain wound CDI with staples Neurologic: Cranial nerves II through XII intact, motor strength is 5/5 in right  deltoid, bicep, tricep, grip, hip flexor, knee extensors, ankle dorsiflexor and plantar flexor 3- left delt , 2- L bi/tri, 3- Left KE, HF, 0/5 L ankle DF--wearing PRAFO   Cerebellar exam cannot perform Left finger to nose to finger as well as heel to shindue to weakness  Musculoskeletal: normal PROM, no swelling   Assessment/Plan: 1. Functional deficits secondary to Right frontal meningioma  which require 3+ hours per day of interdisciplinary therapy in a comprehensive inpatient rehab setting.  Physiatrist is providing close team supervision and 24 hour management of active medical problems listed  below.  Physiatrist and rehab team continue to assess barriers to discharge/monitor patient progress toward functional and medical goals  Care Tool:  Bathing    Body parts bathed by patient: Chest, Left arm, Abdomen, Front perineal area, Right upper leg, Left upper leg, Face, Buttocks, Right lower leg, Left lower leg   Body parts bathed by helper: Right arm     Bathing assist Assist Level: Minimal Assistance - Patient > 75%     Upper Body Dressing/Undressing Upper body dressing   What is the patient wearing?: Bra, Pull over shirt    Upper body assist Assist Level: Minimal Assistance - Patient > 75%    Lower Body Dressing/Undressing Lower body dressing      What is the patient wearing?: Pants     Lower body assist Assist for lower body dressing: Minimal Assistance - Patient > 75%     Toileting Toileting    Toileting assist Assist for toileting: Contact Guard/Touching assist     Transfers Chair/bed transfer  Transfers assist     Chair/bed transfer assist level: Contact Guard/Touching assist     Locomotion Ambulation   Ambulation assist      Assist level: Maximal Assistance - Patient 25 - 49% Assistive device: Hand held assist Max distance: 25   Walk 10 feet activity   Assist     Assist level: Maximal Assistance - Patient 25 - 49% Assistive device: Hand held assist, No Device   Walk 50 feet activity   Assist Walk 50 feet with 2 turns activity did not occur: Safety/medical concerns         Walk  150 feet activity   Assist Walk 150 feet activity did not occur: Safety/medical concerns         Walk 10 feet on uneven surface  activity   Assist Walk 10 feet on uneven surfaces activity did not occur: Safety/medical concerns         Wheelchair     Assist   Type of Wheelchair: Manual    Wheelchair assist level: Moderate Assistance - Patient 50 - 74% Max wheelchair distance: 150    Wheelchair 50 feet with 2 turns  activity    Assist        Assist Level: Moderate Assistance - Patient 50 - 74%   Wheelchair 150 feet activity     Assist      Assist Level: Moderate Assistance - Patient 50 - 74%   Blood pressure 115/71, pulse (!) 102, temperature 98.8 F (37.1 C), temperature source Oral, resp. rate 16, height _0  (1.6 m), weight 74.5 kg, SpO2 98 %.  Medical Problem List and Plan: 1.  Left side weakness secondary to recurrent meningioma status post right frontal craniotomy resection 04/16/2020- previous craniotomy for meningioma was 2014             -patient may shower if uses shower cap until staples out              -ELOS/Goals 6/24 discussed date with pt husband and pt  Team conference today please see physician documentation under team conference tab, met with team  to discuss problems,progress, and goals. Formulized individual treatment plan based on medical history, underlying problem and comorbidities. 2.  Antithrombotics: -DVT/anticoagulation: Subcutaneous heparin initiated 04/19/2020             -antiplatelet therapy: N/A 3. Pain Management: Oxycodone as needed 4. Mood: Provide emotional support             -antipsychotic agents: N/A 5. Neuropsych: This patient is capable of making decisions on her own behalf. 6. Skin/Wound Care: Routine skin checks,crani staple removal , 1/2 today and 1/2 in am 7. Fluids/Electrolytes/Nutrition: Routine in and outs Repeat BMET nl 8.  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  Keppra 1500 mg twice daily 9.  Hypertension.  Toprol 12.5 mg daily.  Monitor with increased mobility Vitals:   04/29/20 0456 04/29/20 0458  BP:  115/71  Pulse:  (!) 102  Resp:  16  Temp: 98.8 F (37.1 C)   SpO2:  98%  Controlled 6/16, mild tachy today but general runs in 80-90s 10.  Constipation.  Currently on MiraLAX as needed.  Adjust bowel program as needed, last recorded BMs 2 d ago  106.  Hyperlipidemia.   Lipitor 12.  History of tobacco use.  Provide counseling 13. Previous craniotomy with mild L hemiparesis since 2014 for meningioma resection   14.  Leukocytosis without fever will monitor , likely due to decadron, will start to wean Reduce to 46m tomorrow  LOS: 8 days A FACE TO FACE EVALUATION WAS PERFORMED  ACharlett Blake6/16/2021, 10:23 AM

## 2020-04-29 NOTE — Progress Notes (Signed)
Occupational Therapy Session Note  Patient Details  Name: [name] [name] MRN: [id number] Date of Birth: [birth year]  Today's Date: 04/29/2020 OT Individual Time: 0700-0800 OT Individual Time Calculation (min): 60 min    Short Term Goals: Week 1:  OT Short Term Goal 1 (Week 1): Pt will dress UB with use of hemi techniques with modA. OT Short Term Goal 2 (Week 1): Pt will complete LB dressing with modA at sit to stand level, demonstrating improved dynamic standing balance. OT Short Term Goal 3 (Week 1): Pt will complete toilet transfer with minA with LRAD.  Skilled Therapeutic Interventions/Progress Updates:    Upon entering the room, pt supine in bed with no c/o pain but reports not sleeping well and up multiple times for BMs. Pt agreeable to OT intervention. Bed flat and pt engaged in L UE PNF D1 movement pattern exercises gravity eliminated with AAROM. Pt performed supine >sit from flat bed without use of AD with supervision. Pt donning bra and pull over shirt with min A this session. Pt utilized figure four for LB dressing but needing assistance to maintain L LE onto R knee. OT standing with CGA for balance to pull pants over B hips. Total A to don B TED hose and L shoe with brace. Pt able to don R shoe independently with use of elastic laces. Pt performed grooming tasks at sink with supervision. OT assisted pt via wheelchair to day room. Pt standing from wheelchair with CGA and then close supervision for 7 minute while L hand in weight bearing position while engaged in word search puzzle ( favorite leisure activity). Pt requesting to sit secondary to fatigue. Pt returned to room with chair alarm belt donned and call bell within reach. Lap tray placed with L UE in neutral position before exiting the room.   Therapy Documentation Precautions:  Precautions Precautions: Fall Restrictions Weight Bearing Restrictions: No General:   Vital Signs: Therapy Vitals Temp: 98.8 F (37.1 C) Temp  Source: Oral Pulse Rate: (!) 102 Resp: 16 BP: 115/71 Patient Position (if appropriate): Lying Oxygen Therapy SpO2: 98 % O2 Device: Room Air Pain:   ADL: ADL Eating: Not assessed Upper Body Bathing: Moderate assistance Where Assessed-Upper Body Bathing: Shower (tub transfer bench) Lower Body Bathing: Moderate assistance Where Assessed-Lower Body Bathing: Shower (tub transfer bench) Upper Body Dressing: Maximal assistance Where Assessed-Upper Body Dressing: Wheelchair Lower Body Dressing: Maximal assistance Where Assessed-Lower Body Dressing: Wheelchair (sit to stand level) Toileting: Not assessed Toilet Transfer: Not assessed Tub/Shower Transfer: Minimal assistance Social research officer, government: Minimal assistance Social research officer, government Method: Education officer, environmental: Radio broadcast assistant, Grab bars   Therapy/Group: Individual Therapy  Gypsy Decant 04/29/2020, 8:28 AM

## 2020-04-29 NOTE — Progress Notes (Signed)
Orthopedic Tech Progress Note Patient Details:  [name] [name] [birth year] [id number] Called in order to [employer] for AFO. Patient ID: [name] [name], female   DOB: 1956-02-20, 65 y.o.   MRN: [id number]   Janit Pagan 04/29/2020, 10:33 AM

## 2020-04-30 ENCOUNTER — Inpatient Hospital Stay (HOSPITAL_COMMUNITY): Payer: No Typology Code available for payment source | Admitting: Physical Therapy

## 2020-04-30 ENCOUNTER — Inpatient Hospital Stay (HOSPITAL_COMMUNITY): Payer: No Typology Code available for payment source | Admitting: Occupational Therapy

## 2020-04-30 ENCOUNTER — Inpatient Hospital Stay (HOSPITAL_COMMUNITY): Payer: No Typology Code available for payment source | Admitting: *Deleted

## 2020-04-30 MED ORDER — DEXAMETHASONE 2 MG PO TABS
2.0000 mg | ORAL_TABLET | Freq: Every day | ORAL | Status: DC
  Administered 2020-05-01 – 2020-05-07 (×7): 2 mg via ORAL
  Filled 2020-04-30 (×7): qty 1

## 2020-04-30 NOTE — Plan of Care (Signed)
  Problem: Consults Goal: RH BRAIN INJURY PATIENT EDUCATION Description: Description: See Patient Education module for eduction specifics Outcome: Progressing Goal: Skin Care Protocol Initiated - if Braden Score 18 or less Description: If consults are not indicated, leave blank or document N/A Outcome: Progressing Goal: Nutrition Consult-if indicated Outcome: Progressing   Problem: RH BOWEL ELIMINATION Goal: RH STG MANAGE BOWEL WITH ASSISTANCE Description: STG Manage Bowel with mod I  Assistance. Outcome: Progressing Goal: RH STG MANAGE BOWEL W/MEDICATION W/ASSISTANCE Description: STG Manage Bowel with Medication with mod I Assistance. Outcome: Progressing   Problem: RH BLADDER ELIMINATION Goal: RH STG MANAGE BLADDER WITH ASSISTANCE Description: STG Manage Bladder With no Assistance Outcome: Progressing   Problem: RH SKIN INTEGRITY Goal: RH STG ABLE TO PERFORM INCISION/WOUND CARE W/ASSISTANCE Description: STG Able To Perform Incision/Wound Care With  min Assistance. Outcome: Progressing   Problem: RH SAFETY Goal: RH STG ADHERE TO SAFETY PRECAUTIONS W/ASSISTANCE/DEVICE Description: STG Adhere to Safety Precautions With supervision Assistance/Device. Outcome: Progressing Goal: RH STG DECREASED RISK OF FALL WITH ASSISTANCE Description: STG Decreased Risk of Fall With  supervision Assistance. Outcome: Progressing   Problem: RH COGNITION-NURSING Goal: RH STG ANTICIPATES NEEDS/CALLS FOR ASSIST W/ASSIST/CUES Description: STG Anticipates Needs/Calls for Assist With  supervision Assistance/Cues. Outcome: Progressing   Problem: RH PAIN MANAGEMENT Goal: RH STG PAIN MANAGED AT OR BELOW PT'S PAIN GOAL Description: At or below level 3 Outcome: Progressing   Problem: RH KNOWLEDGE DEFICIT BRAIN INJURY Goal: RH STG INCREASE KNOWLEDGE OF SELF CARE AFTER BRAIN INJURY Description: Patient and spouse will be able to manage care at discharge independently using handouts/resources as  references Outcome: Progressing

## 2020-04-30 NOTE — Progress Notes (Signed)
Recreational Therapy Session Note  Patient Details  Name: [name] [name] MRN: [id number] Date of Birth: 1956-01-20 Today's Date: 04/30/2020  Pain: no c/o Skilled Therapeutic Interventions/Progress Updates: Per pt request, 2 drops of lavender essential oil applied to a cotton ball and placed on bedside table for inhalation in the room as pt reports that is helpful in relaxation.  Nursing made aware. , 3:53 PM

## 2020-04-30 NOTE — Progress Notes (Signed)
Physical Therapy Weekly Progress Note  Patient Details  Name: [name] [name] MRN: [id number] Date of Birth: 11/21/1955  Beginning of progress report period: April 22, 2020 End of progress report period: April 30, 2020  Today's Date: 04/30/2020 PT Individual Time: 0805-0905 AND 1302-1405  PT Individual Time Calculation (min): 60 min and 63 min  Patient has met 4 of 4 short term goals.  Pt has made better then expected progress over the last week. Pt has improved to CGA for gait and transfers with bouts of supervision assist for ambulation with AFO and RW. Significant improvements in LLE Improved ROM, strength, muscle timing and coordination noted as well.   Patient continues to demonstrate the following deficits muscle weakness, muscle joint tightness and muscle paralysis, decreased cardiorespiratoy endurance, impaired timing and sequencing, abnormal tone, unbalanced muscle activation and decreased coordination and decreased sitting balance, decreased standing balance, decreased postural control, hemiplegia and decreased balance strategies and therefore will continue to benefit from skilled PT intervention to increase functional independence with mobility.  Patient progressing toward long term goals..  Continue plan of care.  PT Short Term Goals Week 1:  PT Short Term Goal 1 (Week 1): Pt will perform bed mobility with supervision assist PT Short Term Goal 1 - Progress (Week 1): Met PT Short Term Goal 2 (Week 1): Pt will transfer to and from WC with min assist consistently PT Short Term Goal 2 - Progress (Week 1): Met PT Short Term Goal 3 (Week 1): Pt will ambulate 50ft with min assist and LRAD PT Short Term Goal 3 - Progress (Week 1): Met PT Short Term Goal 4 (Week 1): pt will ascend 4 steps with 1 rail and mod assist PT Short Term Goal 4 - Progress (Week 1): Met Week 2:  PT Short Term Goal 1 (Week 2): STG=LTG due to ELOS  Skilled Therapeutic Interventions/Progress Updates:   Pt received  supine in bed and agreeable to PT. Supine>sit transfer with supervision assist from PT with cues for improved LUE placement and use to engage parascapular musculature. Upper and lower body dressing with min assist for clothing management and supervision assist for sit<>stand with RW to pull pants and underpants to waist. Pt performed stand pivot transfer to WC with RW and LAFO as well as supervision assist for safety and cues for AD management. Pt performed oral hygiene at sink with supervision assist and cues for toothbrush placement and hemi techinque.   Pt transported to rehab gym. Dynamic gait training to simulate community environment over unlevel foam mat and to weave through 8 cones with CGA and min cues for AD management up/down ledge on mat. Variable gait training forward/revese 4x15 each with CGA for improved posture and pelvic rotation and cues for improved step length. Side stepping R and L  At rail with min assist to improve alignment and posture.   Stair management training with min assist x 8 with min cues for step to gait pattern.   Gait training through hall with RW and LAFO x 200ft and CGA-supervision assist from PT for safety and improved step height on the L and symmetrical step length. Patient returned to room and left sitting in WC with call bell in reach and all needs met.     session 2.  Pt received sitting in WC and agreeable to PT. Stand pivot transfer to WC with RW and LAFO, supervision assist overall.   Gait training with orthotist present for orthotic consult with recommendation for [place] walk On AFO   with posterior support. Use of RW and supervision A-CGA throughout. Mild foot drag intermittently.   Nustep BLE reciprocal movement training x 8 minutes. Cues for improved ROM and hip positioning to prevent excessive ER  Standing balance while engaged in Wii sports with UE support on RW and supervision assist overall. Min cues for improved symmetry through WB, but no  LOB.  Additional gait training through hall with AFO and RW x 169ft and supervision assist for safety. Cues for improved knee flexion in swing.    Patient returned to room and left sitting on toilet with call bell in reach and all needs met.              Therapy Documentation Precautions:  Precautions Precautions: Fall Restrictions Weight Bearing Restrictions: No Pain: Pain Assessment Pain Scale: 0-10 Pain Score: 0-No pain   Therapy/Group: Individual Therapy  Austin E Tucker 04/30/2020, 10:04 AM   

## 2020-04-30 NOTE — Progress Notes (Signed)
Occupational Therapy Session Note  Patient Details  Name: [name] [name] MRN: [id number] Date of Birth: [birth year]  Today's Date: 04/30/2020 OT Individual Time: 1000-1113 OT Individual Time Calculation (min): 73 min    Short Term Goals: Week 1:  OT Short Term Goal 1 (Week 1): Pt will dress UB with use of hemi techniques with modA. OT Short Term Goal 2 (Week 1): Pt will complete LB dressing with modA at sit to stand level, demonstrating improved dynamic standing balance. OT Short Term Goal 3 (Week 1): Pt will complete toilet transfer with minA with LRAD.  Skilled Therapeutic Interventions/Progress Updates:    Upon entering the room, pt seated in wheelchair and awaiting OT arrival. Pt requesting to shower this session. Pt ambulating with RW into bathroom with CGA. Pt sitting on commode to doff clothing items with S - CGA for standing balance. Pt transferred onto TTB for bathing tasks and continues to incorporate L UE into bathing tasks with use of bath mitt. Pt standing with min guard to wash buttocks and peri area. Pt transferred back to commode to don cloean clothing items. OT fastening bra and providing pt with education for donning it like sports bra and she was able to do on her own. Pt donning LB clothing with min cuing for hemiplegic technique. Pt utilizing figure four position without assistance from therapist. Pt needing assist to don B ted hose and L shoe and AFO secondary to fit. Pt ambulating back to wheelchair in same manner as above. Pt seated in wheelchair with focus on yellow theraputty exercises with handout provided. OT also providing pt with CVA notebook with education resources for her to read through. Family education set up with husband who arrived towards end of session. Call bell and all needed items within reach. Lap tray donned.  Therapy Documentation Precautions:  Precautions Precautions: Fall Restrictions Weight Bearing Restrictions: No    ADL: ADL Eating: Not  assessed Upper Body Bathing: Moderate assistance Where Assessed-Upper Body Bathing: Shower (tub transfer bench) Lower Body Bathing: Moderate assistance Where Assessed-Lower Body Bathing: Shower (tub transfer bench) Upper Body Dressing: Maximal assistance Where Assessed-Upper Body Dressing: Wheelchair Lower Body Dressing: Maximal assistance Where Assessed-Lower Body Dressing: Wheelchair (sit to stand level) Toileting: Not assessed Toilet Transfer: Not assessed Tub/Shower Transfer: Minimal assistance Social research officer, government: Minimal assistance Social research officer, government Method: Education officer, environmental: Radio broadcast assistant, Grab bars   Therapy/Group: Individual Therapy  Gypsy Decant 04/30/2020, 4:59 PM

## 2020-04-30 NOTE — Progress Notes (Signed)
[place] PHYSICAL MEDICINE & REHABILITATION PROGRESS NOTE   Subjective/Complaints:    ROS: Patient denies CP, SOB, N/V/D    Objective:   No results found. No results for input(s): WBC, HGB, HCT, PLT in the last 72 hours. No results for input(s): NA, K, CL, CO2, GLUCOSE, BUN, CREATININE, CALCIUM in the last 72 hours.  Intake/Output Summary (Last 24 hours) at 04/30/2020 0855 Last data filed at 04/30/2020 0803 Gross per 24 hour  Intake 340 ml  Output --  Net 340 ml     Physical Exam: Vital Signs Blood pressure 111/60, pulse 90, temperature 98 F (36.7 C), resp. rate 15, height 5\' 3"  (1.6 m), weight 74.5 kg, SpO2 100 %.   General: No acute distress Mood and affect are appropriate Heart: Regular rate and rhythm no rubs murmurs or extra sounds Lungs: Clear to auscultation, breathing unlabored, no rales or wheezes Abdomen: Positive bowel sounds, soft nontender to palpation, nondistended Extremities: No clubbing, cyanosis, or edema Skin: No evidence of breakdown, no evidence of rash, scalp incision CDI 1/2 staples removed no dehiscence  Neurologic: Cranial nerves II through XII intact, motor strength is 5/5 in right  deltoid, bicep, tricep, grip, hip flexor, knee extensors, ankle dorsiflexor and plantar flexor 3- left delt , 3- L bi/tri, 3/5 wrist flexor 3- Left KE, HF, 0/5 L ankle DF--wearing PRAFO   Cerebellar exam cannot perform Left finger to nose to finger as well as heel to shindue to weakness  Musculoskeletal: normal PROM, no swelling   Assessment/Plan: 1. Functional deficits secondary to Right frontal meningioma  which require 3+ hours per day of interdisciplinary therapy in a comprehensive inpatient rehab setting.  Physiatrist is providing close team supervision and 24 hour management of active medical problems listed below.  Physiatrist and rehab team continue to assess barriers to discharge/monitor patient progress toward functional and medical goals  Care  Tool:  Bathing    Body parts bathed by patient: Chest, Left arm, Abdomen, Front perineal area, Right upper leg, Left upper leg, Face, Buttocks, Right lower leg, Left lower leg   Body parts bathed by helper: Right arm     Bathing assist Assist Level: Minimal Assistance - Patient > 75%     Upper Body Dressing/Undressing Upper body dressing   What is the patient wearing?: Bra, Pull over shirt    Upper body assist Assist Level: Minimal Assistance - Patient > 75%    Lower Body Dressing/Undressing Lower body dressing      What is the patient wearing?: Pants     Lower body assist Assist for lower body dressing: Minimal Assistance - Patient > 75%     Toileting Toileting    Toileting assist Assist for toileting: Contact Guard/Touching assist     Transfers Chair/bed transfer  Transfers assist     Chair/bed transfer assist level: Contact Guard/Touching assist     Locomotion Ambulation   Ambulation assist      Assist level: Maximal Assistance - Patient 25 - 49% Assistive device: Hand held assist Max distance: 25   Walk 10 feet activity   Assist     Assist level: Maximal Assistance - Patient 25 - 49% Assistive device: Hand held assist, No Device   Walk 50 feet activity   Assist Walk 50 feet with 2 turns activity did not occur: Safety/medical concerns         Walk 150 feet activity   Assist Walk 150 feet activity did not occur: Safety/medical concerns  Walk 10 feet on uneven surface  activity   Assist Walk 10 feet on uneven surfaces activity did not occur: Safety/medical concerns         Wheelchair     Assist   Type of Wheelchair: Manual    Wheelchair assist level: Moderate Assistance - Patient 50 - 74% Max wheelchair distance: 150    Wheelchair 50 feet with 2 turns activity    Assist        Assist Level: Moderate Assistance - Patient 50 - 74%   Wheelchair 150 feet activity     Assist      Assist  Level: Moderate Assistance - Patient 50 - 74%   Blood pressure 111/60, pulse 90, temperature 98 F (36.7 C), resp. rate 15, height 5\' 3"  (1.6 m), weight 74.5 kg, SpO2 100 %.  Medical Problem List and Plan: 1.  Left side weakness secondary to recurrent meningioma status post right frontal craniotomy resection 04/16/2020- previous craniotomy for meningioma was 2014             -patient may shower Rad onc eval as OP              -ELOS/Goals 6/24 discussed date with pt husband and pt  , AFO LLE ordered 2.  Antithrombotics: -DVT/anticoagulation: Subcutaneous heparin initiated 04/19/2020             -antiplatelet therapy: N/A 3. Pain Management: Oxycodone as needed 4. Mood: Provide emotional support             -antipsychotic agents: N/A 5. Neuropsych: This patient is capable of making decisions on her own behalf. 6. Skin/Wound Care: Routine skin checks,crani staple remove remaining  1/2 today  7. Fluids/Electrolytes/Nutrition: Routine in and outs Repeat BMET nl 8.  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  Keppra 1500 mg twice daily 9.  Hypertension.  Toprol 12.5 mg daily.  Monitor with increased mobility Vitals:   04/29/20 2013 04/30/20 0526  BP: (!) 97/59 111/60  Pulse: 84 90  Resp: 16 15  Temp: 98.3 F (36.8 C) 98 F (36.7 C)  SpO2: 97% 100%  Controlled 6/17, mild tachy today but general runs in 80-90s 10.  Constipation.  Currently on MiraLAX as needed.  Adjust bowel program as needed, last recorded BMs 2 d ago  48.  Hyperlipidemia.  Lipitor 12.  History of tobacco use.  Provide counseling 13. Previous craniotomy with mild L hemiparesis since 2014 for meningioma resection   14.  Leukocytosis without fever will monitor , likely due to decadron, will start to wean Reduce to 2mg  tomorrow , will check if NS wishes to wean to off prior to discharge date  LOS: 9 days A FACE TO [place] E  Cashton Hosley 04/30/2020, 8:55 AM

## 2020-04-30 NOTE — Progress Notes (Signed)
Occupational Therapy Weekly Progress Note  Patient Details  Name: [name] [name] MRN: [id number] Date of Birth: 18-Jun-1956  Beginning of progress report period: April 22, 2020 End of progress report period: April 30, 2020   Patient has met 3 of 3 short term goals. Pt making significant progress towards occupational therapy goals. Pt very motivated for OT intervention during all sessions. Husband has been present and family education is scheduled for this weekend with plans for discharge next week. Pt currently performing all functional transfer with RW and AFO with CGA. If pt ambulates or transfers without AFO she is min A with min cuing for safety. Pt performs UB self care with set up A and LB with min A. Pt attempting to utilize L UE in functional way without cuing to do so. Pt utilizing hand over hand technique without cuing to do so.   Patient continues to demonstrate the following deficits: muscle weakness, decreased cardiorespiratoy endurance, abnormal tone, decreased coordination and decreased motor planning and decreased sitting balance, decreased standing balance, decreased postural control, hemiplegia and decreased balance strategies and therefore will continue to benefit from skilled OT intervention to enhance overall performance with BADL and iADL.  Patient progressing toward long term goals..  Continue plan of care.  OT Short Term Goals Week 1:  OT Short Term Goal 1 (Week 1): Pt will dress UB with use of hemi techniques with modA. OT Short Term Goal 1 - Progress (Week 1): Met OT Short Term Goal 2 (Week 1): Pt will complete LB dressing with modA at sit to stand level, demonstrating improved dynamic standing balance. OT Short Term Goal 2 - Progress (Week 1): Met OT Short Term Goal 3 (Week 1): Pt will complete toilet transfer with minA with LRAD. OT Short Term Goal 3 - Progress (Week 1): Met Week 2:  OT Short Term Goal 1 (Week 2): STGs=LTGs secondary to upcoming discharge       Therapy Documentation Precautions:  Precautions Precautions: Fall Restrictions Weight Bearing Restrictions: No   ADL: ADL Eating: Not assessed Upper Body Bathing: Moderate assistance Where Assessed-Upper Body Bathing: Shower (tub transfer bench) Lower Body Bathing: Moderate assistance Where Assessed-Lower Body Bathing: Shower (tub transfer bench) Upper Body Dressing: Maximal assistance Where Assessed-Upper Body Dressing: Wheelchair Lower Body Dressing: Maximal assistance Where Assessed-Lower Body Dressing: Wheelchair (sit to stand level) Toileting: Not assessed Toilet Transfer: Not assessed Tub/Shower Transfer: Minimal assistance Social research officer, government: Minimal assistance Social research officer, government Method: Education officer, environmental: Radio broadcast assistant, Grab bars   Therapy/Group: Individual Therapy  Gypsy Decant 04/30/2020, 5:16 PM

## 2020-05-01 ENCOUNTER — Inpatient Hospital Stay (HOSPITAL_COMMUNITY): Payer: No Typology Code available for payment source | Admitting: Physical Therapy

## 2020-05-01 ENCOUNTER — Inpatient Hospital Stay (HOSPITAL_COMMUNITY): Payer: BC Managed Care – PPO | Admitting: Occupational Therapy

## 2020-05-01 NOTE — Progress Notes (Signed)
Late entry: Staples removed from incision to cranium, pt tolerated well

## 2020-05-01 NOTE — Progress Notes (Signed)
[place] PHYSICAL MEDICINE & REHABILITATION PROGRESS NOTE   Subjective/Complaints:  No issues overnite , going to OT   ROS: Patient denies CP, SOB, N/V/D    Objective:   No results found. No results for input(s): WBC, HGB, HCT, PLT in the last 72 hours. No results for input(s): NA, K, CL, CO2, GLUCOSE, BUN, CREATININE, CALCIUM in the last 72 hours.  Intake/Output Summary (Last 24 hours) at 05/01/2020 0824 Last data filed at 04/30/2020 1842 Gross per 24 hour  Intake 335 ml  Output --  Net 335 ml     Physical Exam: Vital Signs Blood pressure 115/72, pulse 100, temperature 97.7 F (36.5 C), temperature source Oral, resp. rate 18, height 5\' 3"  (1.6 m), weight 74.5 kg, SpO2 100 %.   General: No acute distress Mood and affect are appropriate Heart: Regular rate and rhythm no rubs murmurs or extra sounds Lungs: Clear to auscultation, breathing unlabored, no rales or wheezes Abdomen: Positive bowel sounds, soft nontender to palpation, nondistended Extremities: No clubbing, cyanosis, or edema Skin: No evidence of breakdown, no evidence of rash  Skin: No evidence of breakdown, no evidence of rash, scalp incision CDI 1/2 staples removed no dehiscence  Neurologic: Cranial nerves II through XII intact, motor strength is 5/5 in right  deltoid, bicep, tricep, grip, hip flexor, knee extensors, ankle dorsiflexor and plantar flexor 3- left delt , 3- L bi/tri, 3/5 wrist flexor 3- Left KE, HF, 0/5 L ankle DF--wearing PRAFO   Cerebellar exam cannot perform Left finger to nose to finger as well as heel to shindue to weakness  Musculoskeletal: normal PROM, no swelling   Assessment/Plan: 1. Functional deficits secondary to Right frontal meningioma  which require 3+ hours per day of interdisciplinary therapy in a comprehensive inpatient rehab setting.  Physiatrist is providing close team supervision and 24 hour management of active medical problems listed below.  Physiatrist and rehab team  continue to assess barriers to discharge/monitor patient progress toward functional and medical goals  Care Tool:  Bathing    Body parts bathed by patient: Chest, Left arm, Abdomen, Front perineal area, Right upper leg, Left upper leg, Face, Buttocks, Right lower leg, Left lower leg, Right arm   Body parts bathed by helper: Right arm     Bathing assist Assist Level: Contact Guard/Touching assist     Upper Body Dressing/Undressing Upper body dressing   What is the patient wearing?: Bra, Pull over shirt    Upper body assist Assist Level: Set up assist    Lower Body Dressing/Undressing Lower body dressing      What is the patient wearing?: Pants     Lower body assist Assist for lower body dressing: Contact Guard/Touching assist     Toileting Toileting    Toileting assist Assist for toileting: Contact Guard/Touching assist     Transfers Chair/bed transfer  Transfers assist     Chair/bed transfer assist level: Contact Guard/Touching assist     Locomotion Ambulation   Ambulation assist      Assist level: Maximal Assistance - Patient 25 - 49% Assistive device: Hand held assist Max distance: 25   Walk 10 feet activity   Assist     Assist level: Maximal Assistance - Patient 25 - 49% Assistive device: Hand held assist, No Device   Walk 50 feet activity   Assist Walk 50 feet with 2 turns activity did not occur: Safety/medical concerns         Walk 150 feet activity   Assist Walk  150 feet activity did not occur: Safety/medical concerns         Walk 10 feet on uneven surface  activity   Assist Walk 10 feet on uneven surfaces activity did not occur: Safety/medical concerns         Wheelchair     Assist   Type of Wheelchair: Manual    Wheelchair assist level: Moderate Assistance - Patient 50 - 74% Max wheelchair distance: 150    Wheelchair 50 feet with 2 turns activity    Assist        Assist Level: Moderate  Assistance - Patient 50 - 74%   Wheelchair 150 feet activity     Assist      Assist Level: Moderate Assistance - Patient 50 - 74%   Blood pressure 115/72, pulse 100, temperature 97.7 F (36.5 C), temperature source Oral, resp. rate 18, height 5\' 3"  (1.6 m), weight 74.5 kg, SpO2 100 %.  Medical Problem List and Plan: 1.  Left side weakness secondary to recurrent meningioma status post right frontal craniotomy resection 04/16/2020- previous craniotomy for meningioma was 2014             -patient may shower Rad onc eval as OP              -ELOS/Goals 6/24 discussed date with pt husband and pt  , AFO LLE ordered 2.  Antithrombotics: -DVT/anticoagulation: Subcutaneous heparin initiated 04/19/2020             -antiplatelet therapy: N/A 3. Pain Management: Oxycodone as needed 4. Mood: Provide emotional support             -antipsychotic agents: N/A 5. Neuropsych: This patient is capable of making decisions on her own behalf. 6. Skin/Wound Care: Routine skin checks,crani staple remove remaining  1/2 today  7. Fluids/Electrolytes/Nutrition: Routine in and outs Repeat BMET nl 8.  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  Keppra 1500 mg twice daily 9.  Hypertension.  Toprol 12.5 mg daily.  Monitor with increased mobility Vitals:   04/30/20 1936 05/01/20 0531  BP: 117/64 115/72  Pulse: 92 100  Resp: 16 18  Temp: 98.4 F (36.9 C) 97.7 F (36.5 C)  SpO2: 100% 100%  Controlled 6/17, mild tachy today but general runs in 80-90s 10.  Constipation.  Currently on MiraLAX as needed.  Adjust bowel program as needed, last recorded BMs 2 d ago  86.  Hyperlipidemia.  Lipitor 12.  History of tobacco use.  Provide counseling 13. Previous craniotomy with mild L hemiparesis since 2014 for meningioma resection   14.  Leukocytosis without fever will monitor , likely due to decadron, will start to wean Reduce to 2mg  tomorrow , will check if NS  wishes to wean to off prior to discharge date  LOS: 10 days A FACE TO [place] E Demica Zook 05/01/2020, 8:24 AM

## 2020-05-01 NOTE — Progress Notes (Signed)
Occupational Therapy Session Note  Patient Details  Name: [name] [name] MRN: [id number] Date of Birth: Apr 04, 1956  Today's Date: 05/01/2020 OT Individual Time: 0962-8366 and 2947-6546 OT Individual Time Calculation (min): 71 min and 56 min   Short Term Goals: Week 2:  OT Short Term Goal 1 (Week 2): STGs=LTGs secondary to upcoming discharge  Skilled Therapeutic Interventions/Progress Updates:    Pt greeted in bed with no c/o pain. Declining shower, opting instead to get dressed. Supine<sit completed unassisted from flat bed without use of bedrails. She dressed EOB at sit<stand level using RW for standing support, CGA for dynamic standing today. OT placed elastic shoelaces in her new sneakers to increase independence with donning footwear. Pt needed Min A for bra using adaptive overhead technique and assist for donning her Lt shoe with AFO component. Pt does well with independently incorporating hemi techniques and also managing her Lt hand in/out of RW orthosis. She completed a short distance ambulatory transfer to the w/c placed near sink using RW with CGA. While sitting, she engaged in face washing and oral care using one handed strategies. Transitioned to IADL retraining focus in the dayroom. Worked on sidestepping and active assist ROM with the Lt hand while engaging in table washing at elevated table, CGA for standing balance. Discussed helping her spouse out with countertop/table washing at home to work on her Lt arm while increasing feelings of purpose post d/c home. OT provided her with a RW bag and discussed functional use at home for ADL/IADL item transport as well. Pt was escorted back to room and remained sitting up in the w/c with all needs within reach and safety belt fastened.   2nd Session 1:1 tx (56 min) Pt greeted via PT handoff in the dayroom. Stand pivot<w/c completed using RW with supervision assistance. Escorted her back to the room and for remainder of session, focused on Lt  NMR of the UE. Applied Saebo Stim One to wrist extensors for 20 minutes during blocked practice grasp/release activity. Skin intact once pads were removed. Continued working on grasp/release while incorporating functional reach with min facilitation from therapist due to proximal weakness. Pt required several rest breaks due to fatigue. Provided moist hot pack to Lt shoulder due to soreness, as well as ice pack for stomach due to pain from belly shots. Pt remained sitting up in the w/c with spouse present, all needs within reach and Lt half lap tray applied to the w/c.   Saebo Stim One 330 pulse width 35 Hz pulse rate On 8 sec/ off 8 sec Ramp up/ down 2 sec Symmetrical Biphasic wave form  Max intensity 155mA at 500 Ohm load   Therapy Documentation Precautions:  Precautions Precautions: Fall Restrictions Weight Bearing Restrictions: No ADL: ADL Eating: Not assessed Upper Body Bathing: Moderate assistance Where Assessed-Upper Body Bathing: Shower (tub transfer bench) Lower Body Bathing: Moderate assistance Where Assessed-Lower Body Bathing: Shower (tub transfer bench) Upper Body Dressing: Maximal assistance Where Assessed-Upper Body Dressing: Wheelchair Lower Body Dressing: Maximal assistance Where Assessed-Lower Body Dressing: Wheelchair (sit to stand level) Toileting: Not assessed Toilet Transfer: Not assessed Tub/Shower Transfer: Minimal assistance Social research officer, government: Minimal assistance Social research officer, government Method: Education officer, environmental: Radio broadcast assistant, Grab bars :     Therapy/Group: Individual Therapy  [name] [name] 05/01/2020, 10:11 AM

## 2020-05-01 NOTE — Progress Notes (Signed)
Physical Therapy Session Note  Patient Details  Name: [name] [name] MRN: [id number] Date of Birth: [birth year]  Today's Date: 05/01/2020 PT Individual Time: 0927-1020 PT Individual Time Calculation (min): 53 min   Short Term Goals: Week 2:  PT Short Term Goal 1 (Week 2): STG=LTG due to ELOS  Skilled Therapeutic Interventions/Progress Updates:   Pt received sitting in WC and agreeable to PT  Gait training with RW and LAFO over level surface. Supervision assist 2x 100 and 176ft with min-mod cues for improved step height and HS activation. Occassional tactile cues to the pelvis to improve timing of rotation with mild improvement in hip/knee flexion with limb advancement.   Dynamic gait training with RW and LAFO to step over canes 6 x 2 with min assist leading with the RLE to force improved knee fleixon with limb advancement. Pt then performed Weave around 10 cones x 2 with CGA-supervision assist with cues for step length on the RLE with turns to prevent LOB and improve symmetry.   Supine NMR:  SLR, Bridges, Heel slides, D1 and D2 PNF LLE. Each completed with AROM progressing to mild manual resistance. And cues for full ROM to improve neruomotor recruitment.  Qped: sustained hold 2 x 30sec. UE shoulder flexion x 5 BUE with min assist to stabilize the LLE at the elbow and improve ROM in lift. Pt able to achieve supine<>sit and supine<>qped with supervision assist and min cues for awareness of the LUE to protect wrist.   Pt left with OT at end of session for next scheduled treatment.       Therapy Documentation Precautions:  Precautions Precautions: Fall Restrictions Weight Bearing Restrictions: No    Pain: Pain Assessment Pain Scale: 0-10 Pain Score: 3  Pain Type: Acute pain Pain Location: Back Pain Orientation: Lower Pain Descriptors / Indicators: Aching Pain Frequency: Intermittent Pain Onset: On-going Pain Intervention(s): Medication (See eMAR)    Therapy/Group:  Individual Therapy  Lorie Phenix 05/01/2020, 10:21 AM

## 2020-05-02 ENCOUNTER — Ambulatory Visit (HOSPITAL_COMMUNITY): Payer: BC Managed Care – PPO | Admitting: Physical Therapy

## 2020-05-02 ENCOUNTER — Inpatient Hospital Stay (HOSPITAL_COMMUNITY): Payer: BC Managed Care – PPO | Admitting: Occupational Therapy

## 2020-05-02 MED ORDER — ASPIRIN 81 MG PO CHEW
81.0000 mg | CHEWABLE_TABLET | Freq: Every day | ORAL | Status: DC
  Administered 2020-05-03 – 2020-05-07 (×5): 81 mg via ORAL
  Filled 2020-05-02 (×5): qty 1

## 2020-05-02 NOTE — Plan of Care (Signed)
°  Problem: RH Wheelchair Mobility Goal: LTG Patient will propel w/c in controlled environment (PT) Description: LTG: Patient will propel wheelchair in controlled environment, # of feet with assist (PT) Outcome: Not Applicable Note: Pt will d/c at ambulatory level  Goal: LTG Patient will propel w/c in home environment (PT) Description: LTG: Patient will propel wheelchair in home environment, # of feet with assistance (PT). Outcome: Not Applicable

## 2020-05-02 NOTE — Progress Notes (Signed)
Occupational Therapy Session Note  Patient Details  Name: [name] [name] MRN: [id number] Date of Birth: 17-Jun-1956  Today's Date: 05/02/2020 OT Individual Time: 1400-1500 OT Individual Time Calculation (min): 60 min   Short Term Goals: Week 2:  OT Short Term Goal 1 (Week 2): STGs=LTGs secondary to upcoming discharge  Skilled Therapeutic Interventions/Progress Updates:    Pt greeted seated in wc with daughter present for family ed. Pt and family brought down to tub room and practiced tub bench transfer with daughter providing close supervision/CGA. Educated on bathroom modifications for safe BADL participation. Pt's husband then joined education session and he also practiced tub bench transfer with pt. Pt brought to therapy gym and OT educated on use of Saebo for neuro re-ed. OT provided pt and family with information regarding [employer] if they wanted to purchase one for home use. Educated on activities for NMR at home inlcuding grasping cup and flipping cup over and back, hwo to provide elbow support and pushing and pulling family members hand for resistance, and grasp and release of medium sized foam cubes. Pt returned to room at end of session and left seated in wc with family present and needs met.   Therapy Documentation Precautions:  Precautions Precautions: Fall Restrictions Weight Bearing Restrictions: No Pain: Pain Assessment Pain Scale: 0-10 Pain Score: 0-No pain   Therapy/Group: Individual Therapy  Valma Cava 05/02/2020, 3:02 PM

## 2020-05-02 NOTE — Progress Notes (Signed)
[place] PHYSICAL MEDICINE & REHABILITATION PROGRESS NOTE   Subjective/Complaints: No complaints this morning. Denies pain, constipation, insomnia. Asks whether heparin can be stopped Would prefer discharge a little earlier if safe.   ROS: Patient denies CP, SOB, N/V/D   Objective:   No results found. No results for input(s): WBC, HGB, HCT, PLT in the last 72 hours. No results for input(s): NA, K, CL, CO2, GLUCOSE, BUN, CREATININE, CALCIUM in the last 72 hours.  Intake/Output Summary (Last 24 hours) at 05/02/2020 1456 Last data filed at 05/02/2020 0730 Gross per 24 hour  Intake 462 ml  Output --  Net 462 ml     Physical Exam: Vital Signs Blood pressure 109/62, pulse 85, temperature 97.6 F (36.4 C), temperature source Oral, resp. rate 18, height 5\' 3"  (1.6 m), weight 74.5 kg, SpO2 98 %. General: No acute distress Mood and affect are appropriate Heart: Regular rate and rhythm no rubs murmurs or extra sounds Lungs: Clear to auscultation, breathing unlabored, no rales or wheezes Abdomen: Positive bowel sounds, soft nontender to palpation, nondistended Extremities: No clubbing, cyanosis, or edema Skin: No evidence of breakdown, no evidence of rash Skin: No evidence of breakdown, no evidence of rash, scalp incision CDI- staples removed Neurologic: Cranial nerves II through XII intact, motor strength is 5/5 in right  deltoid, bicep, tricep, grip, hip flexor, knee extensors, ankle dorsiflexor and plantar flexor 3- left delt , 3- L bi/tri, 3/5 wrist flexor 3- Left KE, HF, 0/5 L ankle DF--wearing PRAFO  Cerebellar exam cannot perform Left finger to nose to finger as well as heel to shindue to weakness  Musculoskeletal: normal PROM, no swelling   Assessment/Plan: 1. Functional deficits secondary to Right frontal meningioma  which require 3+ hours per day of interdisciplinary therapy in a comprehensive inpatient rehab setting.  Physiatrist is providing close team supervision and  24 hour management of active medical problems listed below.  Physiatrist and rehab team continue to assess barriers to discharge/monitor patient progress toward functional and medical goals  Care Tool:  Bathing    Body parts bathed by patient: Chest, Left arm, Abdomen, Front perineal area, Right upper leg, Left upper leg, Face, Buttocks, Right lower leg, Left lower leg, Right arm   Body parts bathed by helper: Right arm     Bathing assist Assist Level: Contact Guard/Touching assist     Upper Body Dressing/Undressing Upper body dressing   What is the patient wearing?: Bra, Pull over shirt    Upper body assist Assist Level: Minimal Assistance - Patient > 75%    Lower Body Dressing/Undressing Lower body dressing      What is the patient wearing?: Pants     Lower body assist Assist for lower body dressing: Contact Guard/Touching assist     Toileting Toileting    Toileting assist Assist for toileting: Contact Guard/Touching assist     Transfers Chair/bed transfer  Transfers assist     Chair/bed transfer assist level: Supervision/Verbal cueing     Locomotion Ambulation   Ambulation assist      Assist level: Supervision/Verbal cueing Assistive device: Walker-rolling Max distance: 156ft   Walk 10 feet activity   Assist     Assist level: Supervision/Verbal cueing Assistive device: Walker-rolling, Orthosis   Walk 50 feet activity   Assist Walk 50 feet with 2 turns activity did not occur: Safety/medical concerns  Assist level: Supervision/Verbal cueing Assistive device: Walker-rolling, Orthosis    Walk 150 feet activity   Assist Walk 150 feet activity did  not occur: Safety/medical concerns  Assist level: Supervision/Verbal cueing Assistive device: Walker-rolling, Orthosis    Walk 10 feet on uneven surface  activity   Assist Walk 10 feet on uneven surfaces activity did not occur: Safety/medical concerns   Assist level: Contact  Guard/Touching assist Assistive device: Walker-rolling, Orthosis   Wheelchair     Assist   Type of Wheelchair: Manual    Wheelchair assist level: Moderate Assistance - Patient 50 - 74% Max wheelchair distance: 150    Wheelchair 50 feet with 2 turns activity    Assist        Assist Level: Moderate Assistance - Patient 50 - 74%   Wheelchair 150 feet activity     Assist      Assist Level: Moderate Assistance - Patient 50 - 74%   Blood pressure 109/62, pulse 85, temperature 97.6 F (36.4 C), temperature source Oral, resp. rate 18, height 5\' 3"  (1.6 m), weight 74.5 kg, SpO2 98 %.  Medical Problem List and Plan: 1.  Left side weakness secondary to recurrent meningioma status post right frontal craniotomy resection 04/16/2020- previous craniotomy for meningioma was 2014             -patient may shower Rad onc eval as OP              -ELOS/Goals 6/24 discussed date with pt husband and pt  , AFO LLE ordered  -Continue CIR  2.  Antithrombotics: -DVT/anticoagulation: Subcutaneous heparin discontinued as ambulating >150 feet             -antiplatelet therapy: N/A 3. Pain Management: Oxycodone as needed 4. Mood: Provide emotional support             -antipsychotic agents: N/A 5. Neuropsych: This patient is capable of making decisions on her own behalf. 6. Skin/Wound Care: Routine skin checks, staples removed. 7. Fluids/Electrolytes/Nutrition: Routine in and outs Repeat BMET nl 8.  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  Keppra 1500 mg twice daily 9.  Hypertension.  Toprol 12.5 mg daily.  Monitor with increased mobility Vitals:   05/01/20 1956 05/02/20 0539  BP: 104/63 109/62  Pulse: 92 85  Resp:    Temp: 98.2 F (36.8 C) 97.6 F (36.4 C)  SpO2: 97% 98%  Controlled 6/17, mild tachy today but general runs in 80-90s  6/19: hypotensive, but remains tachy at times so continue toprol.  10.  Constipation.   Currently on MiraLAX as needed.  Adjust bowel program as needed, last recorded BMs 2 d ago  72.  Hyperlipidemia.  Lipitor 12.  History of tobacco use.  Provide counseling 13. Previous craniotomy with mild L hemiparesis since 2014 for meningioma resection 14.  Leukocytosis without fever will monitor , likely due to decadron, will start to wean Reduce to 2mg  , will check if NS wishes to wean to off prior to discharge date  LOS: 11 days A FACE TO FACE EVALUATION WAS [place] 05/02/2020, 2:56 PM

## 2020-05-02 NOTE — Progress Notes (Signed)
Physical Therapy Session Note  Patient Details  Name: [name] [name] MRN: [id number] Date of Birth: [birth year]  Today's Date: 05/02/2020 PT Individual Time: 1300-1356 PT Individual Time Calculation (min): 56 min   Short Term Goals:  Week 2:  PT Short Term Goal 1 (Week 2): STG=LTG due to ELOS  Skilled Therapeutic Interventions/Progress Updates:   Pt received sitting in WC and agreeable to PT. Pt's daughters and husband present for education  Gait training with RW, hand splint and AFO throughout unit with supervision assist including cues for safety with AD management in turns as well as to improve step height on the L and step length on the R 2 x 124f, 1533fand 12049f2 .   Car transfer training with supervision assist to small SUV height. Cues for safety sit>pivot technique and proper gaurding from family.   Stair management training completed 4 steps x 3 with supervision assist from PT and min assist for husband and daughter. Cues for step to gait pattern and proper use of gait belt with gaurding.    Bed mobility to perform sit<>supine to apartment training bed with supervision assist. Min cues for postioning on EOB to allow proper placement once transferred to supine.   Furniture transfer training to sit<>stand form sofa at recliner with supervision assist. Min cues for UE placement as well as to improve anterior weight shift to push into standing.   Patient returned to room and [facility] reach and all needs met.            Therapy Documentation Precautions:  Precautions Precautions: Fall Restrictions Weight Bearing Restrictions: No   Pain: denies   Therapy/Group: Individual Therapy  AusLorie Phenix19/2021, 3:47 PM

## 2020-05-03 NOTE — Plan of Care (Signed)
Problem: RH BOWEL ELIMINATION Goal: RH STG MANAGE BOWEL WITH ASSISTANCE Description: STG Manage Bowel with mod I  Assistance. Outcome: Progressing Flowsheets (Taken 05/03/2020 1745) STG: Pt will manage bowels with assistance: 6-Modified independent Goal: RH STG MANAGE BOWEL W/MEDICATION W/ASSISTANCE Description: STG Manage Bowel with Medication with mod I Assistance. Outcome: Progressing Flowsheets (Taken 05/03/2020 1745) STG: Pt will manage bowels with medication with assistance: 6-Modified independent   Problem: RH BLADDER ELIMINATION Goal: RH STG MANAGE BLADDER WITH ASSISTANCE Description: STG Manage Bladder With no Assistance Outcome: Progressing Flowsheets (Taken 05/03/2020 1745) STG: Pt will manage bladder with assistance: 6-Modified independent   Problem: RH SKIN INTEGRITY Goal: RH STG ABLE TO PERFORM INCISION/WOUND CARE W/ASSISTANCE Description: STG Able To Perform Incision/Wound Care With  min Assistance. Outcome: Progressing Flowsheets (Taken 05/03/2020 1745) STG: Pt will be able to perform incision/wound care with assistance: 6-Modified independent   Problem: RH SAFETY Goal: RH STG ADHERE TO SAFETY PRECAUTIONS W/ASSISTANCE/DEVICE Description: STG Adhere to Safety Precautions With supervision Assistance/Device. Outcome: Progressing Flowsheets (Taken 05/03/2020 1745) STG:Pt will adhere to safety precautions with assistance/device: 6-Modified independent Goal: RH STG DECREASED RISK OF FALL WITH ASSISTANCE Description: STG Decreased Risk of Fall With  supervision Assistance. Outcome: Progressing Flowsheets (Taken 05/03/2020 1745) SAY:[id number] risk of fall  with assistance/device: 6-Modified independent   Problem: RH COGNITION-NURSING Goal: RH STG ANTICIPATES NEEDS/CALLS FOR ASSIST W/ASSIST/CUES Description: STG Anticipates Needs/Calls for Assist With  supervision Assistance/Cues. Outcome: Progressing Flowsheets (Taken 05/03/2020 1745) STG: Anticipates needs/calls for  assistance with assistance/cues: 6-Modified independent   Problem: RH PAIN MANAGEMENT Goal: RH STG PAIN MANAGED AT OR BELOW PT'S PAIN GOAL Description: At or below level 3 Outcome: Progressing Note: Patient reports minimal to no pain at this time. Husband present and engaged in patient's care; receptive to teaching.

## 2020-05-03 NOTE — Progress Notes (Signed)
[facility] PHYSICAL MEDICINE & REHABILITATION PROGRESS NOTE   Subjective/Complaints: No complaints this morning.  Denies pain, constipation, insomnia.   ROS: Patient denies CP, SOB, N/V/D   Objective:   No results found. No results for input(s): WBC, HGB, HCT, PLT in the last 72 hours. No results for input(s): NA, K, CL, CO2, GLUCOSE, BUN, CREATININE, CALCIUM in the last 72 hours.  Intake/Output Summary (Last 24 hours) at 05/03/2020 1219 Last data filed at 05/03/2020 0756 Gross per 24 hour  Intake 780 ml  Output --  Net 780 ml     Physical Exam: Vital Signs Blood pressure (!) 105/50, pulse 96, temperature 98.2 F (36.8 C), temperature source Oral, resp. rate 16, height 5\' 3"  (1.6 m), weight 74.5 kg, SpO2 98 %.   General: Alert and oriented x 3, No apparent distress HEENT: Head is normocephalic, atraumatic, PERRLA, EOMI, sclera anicteric, oral mucosa pink and moist, dentition intact, ext ear canals clear,  Neck: Supple without JVD or lymphadenopathy Heart: Reg rate and rhythm. No murmurs rubs or gallops Chest: CTA bilaterally without wheezes, rales, or rhonchi; no distress Abdomen: Soft, non-tender, non-distended, bowel sounds positive. Extremities: No clubbing, cyanosis, or edema. Pulses are 2+ Skin: No evidence of breakdown, no evidence of rash, scalp incision CDI- staples removed Neurologic: Cranial nerves II through XII intact, motor strength is 5/5 in right  deltoid, bicep, tricep, grip, hip flexor, knee extensors, ankle dorsiflexor and plantar flexor 3- left delt , 3- L bi/tri, 3/5 wrist flexor 3- Left KE, HF, 0/5 L ankle DF--wearing PRAFO  Cerebellar exam cannot perform Left finger to nose to finger as well as heel to shindue to weakness  Musculoskeletal: normal PROM, no swelling   Assessment/Plan: 1. Functional deficits secondary to Right frontal meningioma  which require 3+ hours per day of interdisciplinary therapy in a comprehensive inpatient rehab  setting.  Physiatrist is providing close team supervision and 24 hour management of active medical problems listed below.  Physiatrist and rehab team continue to assess barriers to discharge/monitor patient progress toward functional and medical goals  Care Tool:  Bathing    Body parts bathed by patient: Chest, Left arm, Abdomen, Front perineal area, Right upper leg, Left upper leg, Face, Buttocks, Right lower leg, Left lower leg, Right arm   Body parts bathed by helper: Right arm     Bathing assist Assist Level: Contact Guard/Touching assist     Upper Body Dressing/Undressing Upper body dressing   What is the patient wearing?: Bra, Pull over shirt    Upper body assist Assist Level: Minimal Assistance - Patient > 75%    Lower Body Dressing/Undressing Lower body dressing      What is the patient wearing?: Pants     Lower body assist Assist for lower body dressing: Contact Guard/Touching assist     Toileting Toileting    Toileting assist Assist for toileting: Contact Guard/Touching assist     Transfers Chair/bed transfer  Transfers assist     Chair/bed transfer assist level: Supervision/Verbal cueing     Locomotion Ambulation   Ambulation assist      Assist level: Supervision/Verbal cueing Assistive device: Walker-rolling Max distance: 180   Walk 10 feet activity   Assist     Assist level: Supervision/Verbal cueing Assistive device: Walker-rolling   Walk 50 feet activity   Assist Walk 50 feet with 2 turns activity did not occur: Safety/medical concerns  Assist level: Supervision/Verbal cueing Assistive device: Walker-rolling    Walk 150 feet activity  Assist Walk 150 feet activity did not occur: Safety/medical concerns  Assist level: Supervision/Verbal cueing Assistive device: Walker-rolling    Walk 10 feet on uneven surface  activity   Assist Walk 10 feet on uneven surfaces activity did not occur: Safety/medical  concerns   Assist level: Supervision/Verbal cueing Assistive device: Walker-rolling   Wheelchair     Assist   Type of Wheelchair: Manual    Wheelchair assist level: Moderate Assistance - Patient 50 - 74% Max wheelchair distance: 150    Wheelchair 50 feet with 2 turns activity    Assist        Assist Level: Moderate Assistance - Patient 50 - 74%   Wheelchair 150 feet activity     Assist      Assist Level: Moderate Assistance - Patient 50 - 74%   Blood pressure (!) 105/50, pulse 96, temperature 98.2 F (36.8 C), temperature source Oral, resp. rate 16, height 5\' 3"  (1.6 m), weight 74.5 kg, SpO2 98 %.  Medical Problem List and Plan: 1.  Left side weakness secondary to recurrent meningioma status post right frontal craniotomy resection 04/16/2020- previous craniotomy for meningioma was 2014             -patient may shower Rad onc eval as OP              -ELOS/Goals 6/24 discussed date with pt husband and pt  , AFO LLE ordered  -Continue CIR  2.  Antithrombotics: -DVT/anticoagulation: Subcutaneous heparin discontinued as ambulating >150 feet             -antiplatelet therapy: N/A 3. Pain Management: Tylenol as needed 4. Mood: Provide emotional support. In positive mood.              -antipsychotic agents: N/A 5. Neuropsych: This patient is capable of making decisions on her own behalf. 6. Skin/Wound Care: Routine skin checks, staples removed. 7. Fluids/Electrolytes/Nutrition: Routine in and outs Repeat BMET nl 8.  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  Keppra 1500 mg twice daily 9.  Hypertension.  Toprol 12.5 mg daily.  Monitor with increased mobility Vitals:   05/03/20 0335 05/03/20 0935  BP: 119/68 (!) 105/50  Pulse: 86 96  Resp:    Temp: 98.3 F (36.8 C) 98.2 F (36.8 C)  SpO2: 98% 98%  Controlled 6/17, mild tachy today but general runs in 80-90s  6/19: hypotensive, but remains tachy at  times so continue toprol.   6/20: continues to be hypotensive and tachy. Asymptomatic.  10.  Constipation.  Currently on MiraLAX as needed.  Adjust bowel program as needed, last recorded BMs 2 d ago  90.  Hyperlipidemia.  Lipitor 12.  History of tobacco use.  Provide counseling 13. Previous craniotomy with mild L hemiparesis since 2014 for meningioma resection 14.  Leukocytosis without fever will monitor , likely due to decadron, will start to wean Reduce to 2mg  , will check if NS wishes to wean to off prior to discharge date  LOS: 12 days A FACE TO FACE EVALUATION WAS [place] 05/03/2020, 12:19 PM

## 2020-05-03 NOTE — Plan of Care (Signed)
°  Problem: Consults Goal: RH BRAIN INJURY PATIENT EDUCATION Description: Description: See Patient Education module for eduction specifics Outcome: Progressing Goal: Skin Care Protocol Initiated - if Braden Score 18 or less Description: If consults are not indicated, leave blank or document N/A Outcome: Progressing Goal: Nutrition Consult-if indicated Outcome: Progressing   Problem: RH BOWEL ELIMINATION Goal: RH STG MANAGE BOWEL WITH ASSISTANCE Description: STG Manage Bowel with mod I  Assistance. Outcome: Progressing Goal: RH STG MANAGE BOWEL W/MEDICATION W/ASSISTANCE Description: STG Manage Bowel with Medication with mod I Assistance. Outcome: Progressing   Problem: RH BLADDER ELIMINATION Goal: RH STG MANAGE BLADDER WITH ASSISTANCE Description: STG Manage Bladder With no Assistance Outcome: Progressing   Problem: RH SKIN INTEGRITY Goal: RH STG ABLE TO PERFORM INCISION/WOUND CARE W/ASSISTANCE Description: STG Able To Perform Incision/Wound Care With  min Assistance. Outcome: Progressing   Problem: RH SAFETY Goal: RH STG ADHERE TO SAFETY PRECAUTIONS W/ASSISTANCE/DEVICE Description: STG Adhere to Safety Precautions With supervision Assistance/Device. Outcome: Progressing Goal: RH STG DECREASED RISK OF FALL WITH ASSISTANCE Description: STG Decreased Risk of Fall With  supervision Assistance. Outcome: Progressing   Problem: RH COGNITION-NURSING Goal: RH STG ANTICIPATES NEEDS/CALLS FOR ASSIST W/ASSIST/CUES Description: STG Anticipates Needs/Calls for Assist With  supervision Assistance/Cues. Outcome: Progressing   Problem: RH PAIN MANAGEMENT Goal: RH STG PAIN MANAGED AT OR BELOW PT'S PAIN GOAL Description: At or below level 3 Outcome: Progressing   Problem: RH KNOWLEDGE DEFICIT BRAIN INJURY Goal: RH STG INCREASE KNOWLEDGE OF SELF CARE AFTER BRAIN INJURY Description: Patient and spouse will be able to manage care at discharge independently using handouts/resources as  references Outcome: Progressing

## 2020-05-04 ENCOUNTER — Inpatient Hospital Stay (HOSPITAL_COMMUNITY): Payer: BC Managed Care – PPO | Admitting: Occupational Therapy

## 2020-05-04 ENCOUNTER — Inpatient Hospital Stay (HOSPITAL_COMMUNITY): Payer: BC Managed Care – PPO | Admitting: Physical Therapy

## 2020-05-04 NOTE — Progress Notes (Signed)
Patient ID: [name] [name], female   DOB: [birth year], 64 y.o.   MRN: [id number]   Rolling walker, tub trans bench and bsc ordered through [employer]

## 2020-05-04 NOTE — Progress Notes (Signed)
Physical Therapy Session Note  Patient Details  Name: [name] [name] MRN: [id number] Date of Birth: [birth year]  Today's Date: 05/04/2020 PT Individual Time: 1834-3735 PT Individual Time Calculation (min): 60 min   Short Term Goals: Week 2:  PT Short Term Goal 1 (Week 2): STG=LTG due to ELOS  Skilled Therapeutic Interventions/Progress Updates: Pt presented in w/c agreeable to therapy. Pt denies pain during session. Session focused on gait in outdoor environment. Pt transported to [facility] entrance courtyard. Pt participated in ambulation in courtyard for varying distances of 75-179f. Pt was able to ambulate on uneven surfaces, around obstacles and up/down small grades with overall CGA. Pt was able to safely maneuver RW over uneven levels of patio without cues. PTA provided seated rests between bouts with PTA providing continued education on energy conservation with activity upon d/c. Pt returned to unit and PTA instructed in use of RW for curb stepping. Pt was able to manage lifting RW onto curb and performed ascending/descending curb x 4 with CGA and verbal cues initially for safely managing RW. Pt then transported to day room and performed ambulatory transfer to [employer]. Pt participated in STS x 5 with mirror feedback as pt noted to have increased wt bearing on RLE which improved with manual facilitation to wt shift to LLE from PTA. Pt also performed marches 4 bouts x 10 cycles on 50cm/sec with mirror feedback with PTA providing verbal cues with mirror feedback to increase wt shift to L. Pt then ambulated back to room with RW and close S. Pt returned to w/c at end of session and left with seat alarm on, call bell within reach and needs met.      Therapy Documentation Precautions:  Precautions Precautions: Fall Restrictions Weight Bearing Restrictions: No   Therapy/Group: Individual Therapy  Che Rachal  Forbes Loll, PTA  05/04/2020, 4:51 PM

## 2020-05-04 NOTE — Progress Notes (Signed)
[place] PHYSICAL MEDICINE & REHABILITATION PROGRESS NOTE   Subjective/Complaints:  No issues overnite , using SAEBO to enhance the motor recovery LUE and LLE   ROS: Patient denies CP, SOB, N/V/D   Objective:   No results found. No results for input(s): WBC, HGB, HCT, PLT in the last 72 hours. No results for input(s): NA, K, CL, CO2, GLUCOSE, BUN, CREATININE, CALCIUM in the last 72 hours.  Intake/Output Summary (Last 24 hours) at 05/04/2020 0909 Last data filed at 05/04/2020 0759 Gross per 24 hour  Intake 1380 ml  Output --  Net 1380 ml     Physical Exam: Vital Signs Blood pressure (!) 103/55, pulse 87, temperature 97.6 F (36.4 C), resp. rate 15, height 5\' 3"  (1.6 m), weight 74.5 kg, SpO2 98 %.    General: No acute distress Mood and affect are appropriate Heart: Regular rate and rhythm no rubs murmurs or extra sounds Lungs: Clear to auscultation, breathing unlabored, no rales or wheezes Abdomen: Positive bowel sounds, soft nontender to palpation, nondistended Extremities: No clubbing, cyanosis, or edema Skin: No evidence of breakdown, no evidence of rash  Skin: No evidence of breakdown, no evidence of rash, scalp incision CDI- staples removed Neurologic: motor strength is 5/5 in right  deltoid, bicep, tricep, grip, hip flexor, knee extensors, ankle dorsiflexor and plantar flexor 3- left delt , 3- L bi/tri, 3/5 wrist flexor 3- Left KE, HF, 0/5 L ankle DF--wearing PRAFO  Cerebellar exam cannot perform Left finger to nose to finger as well as heel to shindue to weakness  Musculoskeletal: normal PROM, no swelling   Assessment/Plan: 1. Functional deficits secondary to Right frontal meningioma  which require 3+ hours per day of interdisciplinary therapy in a comprehensive inpatient rehab setting.  Physiatrist is providing close team supervision and 24 hour management of active medical problems listed below.  Physiatrist and rehab team continue to assess barriers to  discharge/monitor patient progress toward functional and medical goals  Care Tool:  Bathing    Body parts bathed by patient: Chest, Left arm, Abdomen, Front perineal area, Right upper leg, Left upper leg, Face, Buttocks, Right lower leg, Left lower leg, Right arm   Body parts bathed by helper: Right arm     Bathing assist Assist Level: Contact Guard/Touching assist     Upper Body Dressing/Undressing Upper body dressing   What is the patient wearing?: Bra, Pull over shirt    Upper body assist Assist Level: Minimal Assistance - Patient > 75%    Lower Body Dressing/Undressing Lower body dressing      What is the patient wearing?: Pants     Lower body assist Assist for lower body dressing: Contact Guard/Touching assist     Toileting Toileting    Toileting assist Assist for toileting: Contact Guard/Touching assist     Transfers Chair/bed transfer  Transfers assist     Chair/bed transfer assist level: Supervision/Verbal cueing     Locomotion Ambulation   Ambulation assist      Assist level: Supervision/Verbal cueing Assistive device: Walker-rolling Max distance: 180   Walk 10 feet activity   Assist     Assist level: Supervision/Verbal cueing Assistive device: Walker-rolling   Walk 50 feet activity   Assist Walk 50 feet with 2 turns activity did not occur: Safety/medical concerns  Assist level: Supervision/Verbal cueing Assistive device: Walker-rolling    Walk 150 feet activity   Assist Walk 150 feet activity did not occur: Safety/medical concerns  Assist level: Supervision/Verbal cueing Assistive device: Walker-rolling  Walk 10 feet on uneven surface  activity   Assist Walk 10 feet on uneven surfaces activity did not occur: Safety/medical concerns   Assist level: Supervision/Verbal cueing Assistive device: Walker-rolling   Wheelchair     Assist   Type of Wheelchair: Manual    Wheelchair assist level: Moderate  Assistance - Patient 50 - 74% Max wheelchair distance: 150    Wheelchair 50 feet with 2 turns activity    Assist        Assist Level: Moderate Assistance - Patient 50 - 74%   Wheelchair 150 feet activity     Assist      Assist Level: Moderate Assistance - Patient 50 - 74%   Blood pressure (!) 103/55, pulse 87, temperature 97.6 F (36.4 C), resp. rate 15, height 5\' 3"  (1.6 m), weight 74.5 kg, SpO2 98 %.  Medical Problem List and Plan: 1.  Left side weakness secondary to recurrent meningioma status post right frontal craniotomy resection 04/16/2020- previous craniotomy for meningioma was 2014             -patient may shower Rad onc eval as OP              -ELOS/Goals 6/24 discussed date with pt husband and pt  , AFO LLE ordered  -Continue CIR  2.  Antithrombotics: -DVT/anticoagulation: Subcutaneous heparin discontinued as ambulating >150 feet             -antiplatelet therapy: N/A 3. Pain Management: Tylenol as needed 4. Mood: Provide emotional support. In positive mood.              -antipsychotic agents: N/A 5. Neuropsych: This patient is capable of making decisions on her own behalf. 6. Skin/Wound Care: Routine skin checks, staples removed. 7. Fluids/Electrolytes/Nutrition: Routine in and outs Repeat BMET nl 8.  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  Keppra 1500 mg twice daily 9.  Hypertension.  Toprol 12.5 mg daily.  Monitor with increased mobility Vitals:   05/03/20 1951 05/04/20 0436  BP: (!) 97/53 (!) 103/55  Pulse: 87 87  Resp: 16 15  Temp: 98.1 F (36.7 C) 97.6 F (36.4 C)  SpO2: 96% 98%  BP low nl range, HR hi nl range  10.  Constipation.  Currently on MiraLAX as needed.  Adjust bowel program as needed, last recorded BMs 2 d ago  62.  Hyperlipidemia.  Lipitor 12.  History of tobacco use.  Provide counseling 13. Previous craniotomy with mild L hemiparesis since 2014 for meningioma  resection 14.  Leukocytosis without fever will monitor , likely due to decadron, will start to wean Reduce to 2mg  , will d/c on this dose given plans for rad onc to begin tx LOS: 13 days A FACE TO FACE EVALUATION WAS PERFORMED  Charlett Blake 05/04/2020, 9:09 AM

## 2020-05-04 NOTE — Plan of Care (Signed)
  Problem: Consults Goal: RH BRAIN INJURY PATIENT EDUCATION Description: Description: See Patient Education module for eduction specifics Outcome: Progressing Goal: Skin Care Protocol Initiated - if Braden Score 18 or less Description: If consults are not indicated, leave blank or document N/A Outcome: Progressing Goal: Nutrition Consult-if indicated Outcome: Progressing   Problem: RH BOWEL ELIMINATION Goal: RH STG MANAGE BOWEL WITH ASSISTANCE Description: STG Manage Bowel with mod I  Assistance. Outcome: Progressing Goal: RH STG MANAGE BOWEL W/MEDICATION W/ASSISTANCE Description: STG Manage Bowel with Medication with mod I Assistance. Outcome: Progressing   Problem: RH BLADDER ELIMINATION Goal: RH STG MANAGE BLADDER WITH ASSISTANCE Description: STG Manage Bladder With no Assistance Outcome: Progressing   Problem: RH SKIN INTEGRITY Goal: RH STG ABLE TO PERFORM INCISION/WOUND CARE W/ASSISTANCE Description: STG Able To Perform Incision/Wound Care With  min Assistance. Outcome: Progressing   Problem: RH SAFETY Goal: RH STG ADHERE TO SAFETY PRECAUTIONS W/ASSISTANCE/DEVICE Description: STG Adhere to Safety Precautions With supervision Assistance/Device. Outcome: Progressing Goal: RH STG DECREASED RISK OF FALL WITH ASSISTANCE Description: STG Decreased Risk of Fall With  supervision Assistance. Outcome: Progressing   Problem: RH COGNITION-NURSING Goal: RH STG ANTICIPATES NEEDS/CALLS FOR ASSIST W/ASSIST/CUES Description: STG Anticipates Needs/Calls for Assist With  supervision Assistance/Cues. Outcome: Progressing   Problem: RH PAIN MANAGEMENT Goal: RH STG PAIN MANAGED AT OR BELOW PT'S PAIN GOAL Description: At or below level 3 Outcome: Progressing   Problem: RH KNOWLEDGE DEFICIT BRAIN INJURY Goal: RH STG INCREASE KNOWLEDGE OF SELF CARE AFTER BRAIN INJURY Description: Patient and spouse will be able to manage care at discharge independently using handouts/resources as  references Outcome: Progressing

## 2020-05-04 NOTE — Progress Notes (Signed)
Patient ID: [name] [name], female   DOB: [birth year], 64 y.o.   MRN: [id number]   Patient declined by [facility].

## 2020-05-04 NOTE — Progress Notes (Addendum)
Occupational Therapy Session Note  Patient Details  Name: [name] [name] MRN: [id number] Date of Birth: 1956-09-13  Today's Date: 05/04/2020 OT Individual Time: 0093-8182 OT Individual Time Calculation (min): 70 min   Short Term Goals: Week 2:  OT Short Term Goal 1 (Week 2): STGs=LTGs secondary to upcoming discharge  Skilled Therapeutic Interventions/Progress Updates:    Pt greeted in the w/c with ADL needs met, premedicated for pain. Started session by reviewing her ability to safely set up and direct care for [facility] Stim One Unit. She plans to independently purchase one for home and we obtained written consent from MD for her prescription. Pt practiced putting together Saebo unit and stimulating appropriate muscles for the Lt UE and Lt LE (wrist extensors and dorsiflexors respectively). Pt able to do this independently after demonstrations. Afterwards, pt directed OT as if therapist were a caregiver when engaging in home exercise programs for the Lt UE using Saebo unit. She continues to need min facilitation for arm guidance due to proximal weakness. Skin intact once pads were removed. At end of session pt remained sitting in the w/c with all needs within reach.   Saebo Stim One 330 pulse width 35 Hz pulse rate On 8 sec/ off 8 sec Ramp up/ down 2 sec Symmetrical Biphasic wave form  Max intensity 170m at 500 Ohm load   Therapy Documentation Precautions:  Precautions Precautions: Fall Restrictions Weight Bearing Restrictions: No ADL: ADL Eating: Not assessed Upper Body Bathing: Moderate assistance Where Assessed-Upper Body Bathing: Shower (tub transfer bench) Lower Body Bathing: Moderate assistance Where Assessed-Lower Body Bathing: Shower (tub transfer bench) Upper Body Dressing: Maximal assistance Where Assessed-Upper Body Dressing: Wheelchair Lower Body Dressing: Maximal assistance Where Assessed-Lower Body Dressing: Wheelchair (sit to stand level) Toileting: Not  assessed Toilet Transfer: Not assessed Tub/Shower Transfer: Minimal assistance WSocial research officer, government Minimal assistance WSocial research officer, governmentMethod: SEducation officer, environmental TRadio broadcast assistant Grab bars    Therapy/Group: Individual Therapy  Allice Garro A Motty Borin 05/04/2020, 12:38 PM

## 2020-05-04 NOTE — Progress Notes (Signed)
Occupational Therapy Session Note  Patient Details  Name: [name] [name] MRN: [id number] Date of Birth: April 28, 1956  Today's Date: 05/04/2020 OT Individual Time: 0702-0800 OT Individual Time Calculation (min): 58 min    Short Term Goals: Week 2:  OT Short Term Goal 1 (Week 2): STGs=LTGs secondary to upcoming discharge  Skilled Therapeutic Interventions/Progress Updates:    Upon entering the room,pt seated in wheelchair and having just finished breakfast. Pt is agreeable to OT intervention and requesting to shower this session. Pt ambulating with RW and close supervision into bathroom to doff clothing items while seated on shower chair for safety. Pt utilized bath mitt on L hand as well to increase independence and figure four position to wash B LEs and feet. Pt exiting the bathroom and seated on EOB for dressing tasks with supervision overall. Pt keeps regular bra fastened and pulls over like sports bra to increase independence with hemiplegic dressing technique. Figure four position utilized to thread clothing onto feet and over B hips with supervision overall. Pt needing assistance to don B TED hose and for L LE AFO. Pt assisted to day room for practice with transitions/transfer with use of RW in tight spaces with pt performing x 4 with close supervision and increased time. Pt returning to the room and remaining in wheelchair with half tray donned to protect L UE. Call bell and all needed items within reach upon exiting the room.   Therapy Documentation Precautions:  Precautions Precautions: Fall Restrictions Weight Bearing Restrictions: No ADL: ADL Eating: Not assessed Upper Body Bathing: Moderate assistance Where Assessed-Upper Body Bathing: Shower (tub transfer bench) Lower Body Bathing: Moderate assistance Where Assessed-Lower Body Bathing: Shower (tub transfer bench) Upper Body Dressing: Maximal assistance Where Assessed-Upper Body Dressing: Wheelchair Lower Body Dressing:  Maximal assistance Where Assessed-Lower Body Dressing: Wheelchair (sit to stand level) Toileting: Not assessed Toilet Transfer: Not assessed Tub/Shower Transfer: Minimal assistance Social research officer, government: Minimal assistance Social research officer, government Method: Education officer, environmental: Radio broadcast assistant, Grab bars   Therapy/Group: Individual Therapy  Gypsy Decant 05/04/2020, 5:15 PM

## 2020-05-05 ENCOUNTER — Inpatient Hospital Stay (HOSPITAL_COMMUNITY): Payer: BC Managed Care – PPO | Admitting: Physical Therapy

## 2020-05-05 ENCOUNTER — Inpatient Hospital Stay (HOSPITAL_COMMUNITY): Payer: BC Managed Care – PPO | Admitting: Occupational Therapy

## 2020-05-05 ENCOUNTER — Inpatient Hospital Stay (HOSPITAL_COMMUNITY): Payer: BC Managed Care – PPO

## 2020-05-05 NOTE — Progress Notes (Signed)
Physical Therapy Session Note ° °Patient Details  °Name: [name] [name] °MRN: [id number] °Date of Birth: 02/18/1956 ° °Today's Date: 05/05/2020 °PT Individual Time: 0900-1000 °PT Individual Time Calculation (min): 60 min  ° °Short Term Goals: °Week 2:  PT Short Term Goal 1 (Week 2): STG=LTG due to ELOS ° °Skilled Therapeutic Interventions/Progress Updates: Pt presented in w/c agreeable to therapy. Pt denies pain during session. Pt transported to day room for energy conservation. Participated in NMES with SAEBO for anterior tibialis activation. Pt required assistance for good placement of electrodes but then was able to appropriately use SAEBO without assist. Pt then participated in supine therex via forced use. Pt participated in heel slides with hip flexion 2 x 15, SLR 2 x 10, SAQ with 3lb wt 2 x 10, bridges from physioball 2 x 10, and SL bridge 2 x 5. Pt then returned to sitting with supervision and PTA donned shoe/AFO total A for time management. Pt ambulated throughout unit supervision level with improving swing through although the shoe continues to intermittently catch less compensatory movement noted. Pt ambulated back to room at end of session and requested to return to bed. Performed bed mobility with supervision and use of bed features from mostly flat bed. Pt left in bed at end of session with bed alarm on, call bell within reach and needs met.  °   ° °Therapy Documentation °Precautions:  °Precautions °Precautions: Fall °Restrictions °Weight Bearing Restrictions: No °General: °  °Vital Signs: °Therapy Vitals °Temp: 98.3 °F (36.8 °C) °Pulse Rate: 98 °Resp: 17 °BP: 119/67 °Patient Position (if appropriate): Sitting °Oxygen Therapy °SpO2: 98 % °O2 Device: Room Air °Pain: °  °Mobility: °  °Locomotion : °   °Trunk/Postural Assessment : °   °Balance: °  °Exercises: °  °Other Treatments:   ° ° ° °Therapy/Group: Individual Therapy ° °Rosita DeChalus °05/05/2020, 4:32 PM  °

## 2020-05-05 NOTE — Progress Notes (Signed)
Occupational Therapy Session Note  Patient Details  Name: [name] [name] MRN: [id number] Date of Birth: 1955/12/20  Today's Date: 05/05/2020 OT Individual Time: 4196-2229 OT Individual Time Calculation (min): 43 min    Short Term Goals: Week 2:  OT Short Term Goal 1 (Week 2): STGs=LTGs secondary to upcoming discharge  Skilled Therapeutic Interventions/Progress Updates:    Pt received in w/c, agreeable to OT session. No c/o pain. Pt was transported to the therapy gym. Pt used RW, independently managing LUE splint and completed transfer to the mat with (S). Pt transitioned into quadriped with CGA and completed guided weight shifting and reaching with BUE to challenge LUE weightbearing and functional reaching. Min facilitation required for L shoulder flexion to 60 degrees and to minimize trapezius compensation. Pt then completed prone push ups, with facilitation provided at the L elbow to support full extension. Tactile cueing provided for scapular activation/stabilization. Pt then transitioned to sitting EOM and completed theraband exercises, focused on the B lats and middle traps/rhomboids. Hands on Facilitation/cueing provided throughout. Pt completed LUE [place] task- focused on tripod grasp and supination/pronation. No proximal support to challenge distal mobility. Pt returned to her room and transferred back to supine. Pt was left with bed alarm set and all needs met.   Therapy Documentation Precautions:  Precautions Precautions: Fall Restrictions Weight Bearing Restrictions: No   Therapy/Group: Individual Therapy  Curtis Sites 05/05/2020, 6:56 AM

## 2020-05-05 NOTE — Progress Notes (Signed)
Patient ID: [name] [name], female   DOB: 11-Jun-1956, 64 y.o.   MRN: [id number]   Patient declined by [facility]

## 2020-05-05 NOTE — Progress Notes (Signed)
Physical Therapy Session Note  Patient Details  Name: [name] [name] MRN: [id number] Date of Birth: [birth year]  Today's Date: 05/05/2020 PT Individual Time: 0803-0830 PT Individual Time Calculation (min): 27 min   Short Term Goals: Week 2:  PT Short Term Goal 1 (Week 2): STG=LTG due to ELOS  Skilled Therapeutic Interventions/Progress Updates:    Patient received in [facility], requesting to get dressed prior to leaving room; needed [place] with TEDS but otherwise able to manage lower body dressing with S, needed MinA for hooking bra together but after this was set up able to perform upper body dressing with MinA today as well. Generally performed functional transfers and gait approximately 267f with S/RW and cues for upright posture today, also continued practicing steps with U railing and min guard for safety. Demonstrated good safety awareness throughout session. [facility].   Therapy Documentation Precautions:  Precautions Precautions: Fall Restrictions Weight Bearing Restrictions: No    Pain: Pain Assessment Pain Scale: 0-10 Pain Score: 0-No pain    Therapy/Group: Individual Therapy   KWindell Norfolk DPT, PN1   Supplemental Physical Therapist [place]   Pager [phone] Rehab Office [phone]   05/05/2020, 12:14 PM

## 2020-05-05 NOTE — Progress Notes (Signed)
[name] [name] PHYSICAL MEDICINE & REHABILITATION PROGRESS NOTE   Subjective/Complaints:  No issues overnite   ROS: Patient denies CP, SOB, N/V/D   Objective:   No results found. No results for input(s): WBC, HGB, HCT, PLT in the last 72 hours. No results for input(s): NA, K, CL, CO2, GLUCOSE, BUN, CREATININE, CALCIUM in the last 72 hours.  Intake/Output Summary (Last 24 hours) at 05/05/2020 0850 Last data filed at 05/05/2020 0752 Gross per 24 hour  Intake 850 ml  Output --  Net 850 ml     Physical Exam: Vital Signs Blood pressure 113/66, pulse 85, temperature 97.9 F (36.6 C), resp. rate 18, height 5\' 3"  (1.6 m), weight 74.5 kg, SpO2 99 %.  General: No acute distress Mood and affect are appropriate Heart: Regular rate and rhythm no rubs murmurs or extra sounds Lungs: Clear to auscultation, breathing unlabored, no rales or wheezes Abdomen: Positive bowel sounds, soft nontender to palpation, nondistended Extremities: No clubbing, cyanosis, or edema Skin: No evidence of breakdown, no evidence of rash  Skin: No evidence of breakdown, no evidence of rash, scalp incision CDI- staples removed Neurologic: motor strength is 5/5 in right  deltoid, bicep, tricep, grip, hip flexor, knee extensors, ankle dorsiflexor and plantar flexor 3- left delt , 3- L bi/tri, 3/5 wrist flexor 3- Left KE, HF, 0/5 L ankle DF--wearing PRAFO  Cerebellar exam cannot perform Left finger to nose to finger as well as heel to shindue to weakness  Musculoskeletal: normal PROM, no swelling   Assessment/Plan: 1. Functional deficits secondary to Right frontal meningioma  which require 3+ hours per day of interdisciplinary therapy in a comprehensive inpatient rehab setting.  Physiatrist is providing close team supervision and 24 hour management of active medical problems listed below.  Physiatrist and rehab team continue to assess barriers to discharge/monitor patient progress toward functional and medical  goals  Care Tool:  Bathing    Body parts bathed by patient: Chest, Left arm, Abdomen, Front perineal area, Right upper leg, Left upper leg, Face, Buttocks, Right lower leg, Left lower leg, Right arm   Body parts bathed by helper: Right arm     Bathing assist Assist Level: Supervision/Verbal cueing     Upper Body Dressing/Undressing Upper body dressing   What is the patient wearing?: Bra, Pull over shirt    Upper body assist Assist Level: Supervision/Verbal cueing    Lower Body Dressing/Undressing Lower body dressing      What is the patient wearing?: Pants     Lower body assist Assist for lower body dressing: Supervision/Verbal cueing     Toileting Toileting    Toileting assist Assist for toileting: Supervision/Verbal cueing     Transfers Chair/bed transfer  Transfers assist     Chair/bed transfer assist level: Supervision/Verbal cueing     Locomotion Ambulation   Ambulation assist      Assist level: Supervision/Verbal cueing Assistive device: Walker-rolling Max distance: 180   Walk 10 feet activity   Assist     Assist level: Supervision/Verbal cueing Assistive device: Walker-rolling   Walk 50 feet activity   Assist Walk 50 feet with 2 turns activity did not occur: Safety/medical concerns  Assist level: Supervision/Verbal cueing Assistive device: Walker-rolling    Walk 150 feet activity   Assist Walk 150 feet activity did not occur: Safety/medical concerns  Assist level: Supervision/Verbal cueing Assistive device: Walker-rolling    Walk 10 feet on uneven surface  activity   Assist Walk 10 feet on uneven surfaces activity did  not occur: Safety/medical concerns   Assist level: Supervision/Verbal cueing Assistive device: Aeronautical engineer   Type of Wheelchair: Manual    Wheelchair assist level: Moderate Assistance - Patient 50 - 74% Max wheelchair distance: 150    Wheelchair 50 feet with 2  turns activity    Assist        Assist Level: Moderate Assistance - Patient 50 - 74%   Wheelchair 150 feet activity     Assist      Assist Level: Moderate Assistance - Patient 50 - 74%   Blood pressure 113/66, pulse 85, temperature 97.9 F (36.6 C), resp. rate 18, height 5\' 3"  (1.6 m), weight 74.5 kg, SpO2 99 %.  Medical Problem List and Plan: 1.  Left side weakness secondary to recurrent meningioma status post right frontal craniotomy resection 04/16/2020- previous craniotomy for meningioma was 2014             -patient may shower Rad onc eval as OP              -ELOS/Goals 6/24 discussed date with pt husband and pt  , AFO LLE ordered  -Continue CIR- team conf in am   2.  Antithrombotics: -DVT/anticoagulation: Subcutaneous heparin discontinued as ambulating >150 feet             -antiplatelet therapy: N/A 3. Pain Management: Tylenol as needed 4. Mood: Provide emotional support. In positive mood.              -antipsychotic agents: N/A 5. Neuropsych: This patient is capable of making decisions on her own behalf. 6. Skin/Wound Care: Routine skin checks, staples removed. 7. Fluids/Electrolytes/Nutrition: Routine in and outs Repeat BMET nl 8.  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  Keppra 1500 mg twice daily 9.  Hypertension.  Toprol 12.5 mg daily.  Monitor with increased mobility Vitals:   05/04/20 1950 05/05/20 0503  BP: 113/62 113/66  Pulse: 83 85  Resp:    Temp: 98 F (36.7 C) 97.9 F (36.6 C)  SpO2: 100% 99%  BPnormal  10.  Constipation.  Currently on MiraLAX as needed.  Adjust bowel program as needed, last recorded BMs 2 d ago  86.  Hyperlipidemia.  Lipitor 12.  History of tobacco use.  Provide counseling 13. Previous craniotomy with mild L hemiparesis since 2014 for meningioma resection 14.  Leukocytosis without fever will monitor , likely due to decadron, will start to wean Reduce to 2mg  , will  d/c on this dose given plans for rad onc to begin tx LOS: 14 days A FACE TO FACE EVALUATION WAS PERFORMED  Charlett Blake 05/05/2020, 8:50 AM

## 2020-05-05 NOTE — Progress Notes (Signed)
Occupational Therapy Session Note  Patient Details  Name: [name] [name] MRN: [id number] Date of Birth: Apr 10, 1956  Today's Date: 05/05/2020 OT Individual Time: 1300-1415 OT Individual Time Calculation (min): 75 min    Short Term Goals: Week 2:  OT Short Term Goal 1 (Week 2): STGs=LTGs secondary to upcoming discharge  Skilled Therapeutic Interventions/Progress Updates:    Patient in bed, alert and ready for therapy session.  She denies pain.  She is able to donn right shoe, mod A for left.  SPT bed to w/c with CGA.  SPT to/from mat table with CGA.  Unsupported sitting tolerated without LOB  Sit to supine on wedge CS.  Utilized hand paddle for first half of session.  Completed proximal NMRE activities with focus on coordination/motor control of shoulder/scapula and elbow with OH reach - she tolerates moderate resistance and facilitation to reduce compensation.  Completed weight bearing and various activities to improve shoulder/scapular stability with trunk and hip mobility.  Closed chain shoulder OH reach with facilitation for forward reach and inhibition of IR/deltoid.  Standing forward lean with focus on scapular mobility in various directions.  Completed wrist and hand mobility and grasp release activities. Arm swing and motor control of UE in stance.  She remained seated in w/c at close of session - call bell and tray table in reach.    Therapy Documentation Precautions:  Precautions Precautions: Fall Restrictions Weight Bearing Restrictions: No  Therapy/Group: Individual Therapy  Carlos Levering 05/05/2020, 7:39 AM

## 2020-05-05 NOTE — Discharge Summary (Signed)
Physician Discharge Summary  Patient ID: [name] [name] MRN: [id number] DOB/AGE: 08/13/1956 64 y.o.  Admit date: 04/21/2020 Discharge date: 05/07/2020  Discharge Diagnoses:  Principal Problem:   Left hemiparesis [facility]) Active Problems:   Meningioma ([place]) DVT prophylaxis Seizure disorder Hypertension Constipation Hyperlipidemia History of tobacco use Previous craniotomy with mild left hemiparesis 2014 for meningioma resection  Discharged Condition: Stable  Significant Diagnostic Studies: DG Chest 1 View  Result Date: 04/16/2020 CLINICAL DATA:  Central venous catheter placement EXAM: CHEST  1 VIEW COMPARISON:  04/12/2020 FINDINGS: Single frontal view of the chest demonstrates a left internal jugular central venous catheter tip overlying superior vena cava. Cardiac silhouette is unremarkable. No airspace disease, effusion, or pneumothorax. No acute bony abnormalities. IMPRESSION: 1. No complications after left internal jugular catheter placement. 2. No acute intrathoracic process. Electronically Signed   By: Randa Ngo M.D.   On: 04/16/2020 19:15   MR BRAIN W WO CONTRAST  Result Date: 04/17/2020 CLINICAL DATA:  Recurrent meningioma post resection EXAM: MRI HEAD WITHOUT AND WITH CONTRAST TECHNIQUE: Multiplanar, multiecho pulse sequences of the brain and surrounding structures were obtained without and with intravenous contrast. CONTRAST:  71mL GADAVIST GADOBUTROL 1 MMOL/ML IV SOLN COMPARISON:  04/13/2020 FINDINGS: Brain: Postoperative changes of right vertex craniotomy for resection of underlying meningioma. There is a thin underlying extra-axial collection of air, fluid, and blood products. Extra-axial air is present along the anterior right frontal convexity. Reduced diffusion in the underlying right frontoparietal lobes likely reflects a combination postoperative contusion/ischemia and blood products, noting subarachnoid and also probable parenchymal susceptibility. There has been  significant resection of the recurrent meningioma. There is possible small volume residual enhancing nodular soft tissue along the right aspect falx on series 18, image 46 measuring approximately 1.4 x 0.6 cm axially. Possible additional residual tumor within the superior sagittal sinus with adjacent minimal contralateral extension (series 18, image 50). There is mild edema. No significant mass effect. No hydrocephalus. Vascular: Major vessel flow voids at the skull base are preserved. Skull and upper cervical spine: Postoperative changes. Normal marrow signal is preserved. Sinuses/Orbits: Paranasal sinuses are aerated. Orbits are unremarkable. Other: Sella is unremarkable.  Mastoid air cells are clear. IMPRESSION: Postoperative changes of extensive resection of recurrent meningioma. Possible two small areas of residual tumor detailed above for which attention on follow-up is recommended Electronically Signed   By: Macy Mis M.D.   On: 04/17/2020 08:28   MR BRAIN W WO CONTRAST  Result Date: 04/13/2020 CLINICAL DATA:  Recurrent meningioma EXAM: MRI HEAD WITHOUT AND WITH CONTRAST TECHNIQUE: Multiplanar, multiecho pulse sequences of the brain and surrounding structures were obtained without and with intravenous contrast. CONTRAST:  71mL GADAVIST GADOBUTROL 1 MMOL/ML IV SOLN COMPARISON:  2016 FINDINGS: Brain: There are two areas of recurrent meningioma. The first along the right frontoparietal convexity measuring approximately 5.7 x 3.2 x 3.4 cm. Adjacent but probably discontiguous second area along the right aspect of the falx with some contralateral extension measures approximately 5.1 x 3.2 x 2.2 cm. There is compression and possible invasion of the adjacent superior sagittal sinus, which remains patent. Mild edema in the underlying frontoparietal white matter. Mild regional mass effect is present. There is no acute infarction or intracranial hemorrhage. Encephalomalacia in the posterior right frontal lobe at  the vertex related to prior resection. There is no hydrocephalus or extra-axial fluid collection. Vascular: Major vessel flow voids at the skull base are preserved. Skull and upper cervical spine: Prior right craniotomy. Normal marrow signal is preserved. Sinuses/Orbits:  Paranasal sinuses are aerated. Orbits are unremarkable. Other: Sella is unremarkable.  Mastoid air cells are clear. IMPRESSION: Recurrent meningioma along the right frontoparietal convexity and falx with some contralateral extension. Compression and possible invasion of the adjacent superior sagittal sinus, which remains patent. Mild underlying parenchymal edema and mass effect. Electronically Signed   By: Macy Mis M.D.   On: 04/13/2020 07:24   EEG adult  Result Date: 04/17/2020 Lora Havens, MD     04/17/2020  6:12 PM Patient Name: [name] [name] MRN: [id number] Epilepsy Attending: Lora Havens Referring Physician/Provider: Dr Kerney Elbe Date: 04/17/2020 Duration: 24.28 mins Patient history: 64 year old female with history of meningioma that was surgically removed in 2014, left hemiparesis, hypertension and hyperlipidemia presented from home with episode of seizure and confusion. EEG to evaluate for seizure Level of alertness: Awake AEDs during EEG study: Keppra, versed Technical aspects: This EEG study was done with scalp electrodes positioned according to the 10-20 International system of electrode placement. Electrical activity was acquired at a sampling rate of 500Hz  and reviewed with a high frequency filter of 70Hz  and a low frequency filter of 1Hz . EEG data were recorded continuously and digitally stored. Description: No clear posterior dominant rhythm was seen. EEG showed continuous generalized low amplitude 2-3Hz  delta slowing admixed with 15 to 18 Hz activity with irregular morphology distributed symmetrically and diffusely.  Hyperventilation and photic stimulation were not performed.   ABNORMALITY -Excessive beta,  generalized -Continuous slow, generalized IMPRESSION: This study is suggestive of moderate diffuse encephalopathy, nonspecific etiology. The excessive beta activity seen in the background is most likely due to the effect of benzodiazepine and is a benign EEG pattern. No seizures or epileptiform discharges were seen throughout the recording. [place]:  Basic Metabolic Panel: No results for input(s): NA, K, CL, CO2, GLUCOSE, BUN, CREATININE, CALCIUM, MG, PHOS in the last 168 hours.  CBC: No results for input(s): WBC, NEUTROABS, HGB, HCT, MCV, PLT in the last 168 hours.  CBG: No results for input(s): GLUCAP in the last 168 hours.  Family history.  Father with prostate cancer as well as DVT.  Denies any diabetes mellitus colon cancer or rectal cancer  Brief HPI:   [name] [name] is a 45 y.o. right-handed female with history of hyperlipidemia, hypertension, tobacco abuse, craniotomy for tumor excision meningioma 2014.  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  Presented 04/12/2020 with left-sided weakness and seizure.  Patient on no antiepileptic medication prior to admission.  Admission chemistries unremarkable.  EEG negative for seizure.  MRI showed recurrent meningioma along the right frontoparietal convexity and falx with some contralateral extension.  Compression and possible invasion of the adjacent superior sagittal sinus which remain patent.  Patient underwent stereotactic right frontal craniotomy resection of meningioma 04/16/2020 per Dr. Kathyrn Sheriff.  Maintained on Keppra for seizure prophylaxis as well as Decadron protocol.  Subcutaneous heparin initiated for DVT prophylaxis 04/19/2020.  Tolerating a regular diet.  Therapy evaluations completed and patient was admitted for a comprehensive rehab program   Hospital Course: Jahara Dail was admitted to rehab 04/21/2020 for inpatient therapies to consist of PT, ST and OT at least three hours five days a week.  Past admission physiatrist, therapy team and rehab RN have worked together to provide customized collaborative inpatient rehab.  Pertaining to patient's recurrent meningioma she had undergone right frontal craniotomy resection 04/16/2020 would follow-up neurosurgery surgical site healing nicely.  Patient to follow-up outpatient with neurosurgery and continue  low-dose Decadron at discharge initially on subcutaneous heparin for DVT prophylaxis discontinued as patient now ambulating.  Pain managed with use of Tylenol only.  Seizure disorder EEG negative Keppra as directed.  Blood pressure controlled on low-dose Toprol and would follow-up outpatient.  Lipitor ongoing for hyperlipidemia.  Bouts of constipation resolved with laxative assistance.  History of tobacco abuse patient received counsel regards to cessation of nicotine products.   Blood pressures were monitored on TID basis and controlled    Rehab course: During patient's stay in rehab weekly team conferences were held to monitor patient's progress, set goals and discuss barriers to discharge. At admission, patient required +2 physical assist sit to stand, minimal assist 5 feet rolling walker.  Minimal guard lower body dressing minimal assist toilet transfers   Physical exam.  Blood pressure 110/60 pulse 80 temperature 98 respirations 18 oxygen saturations 92% room air Constitutional.  Alert and oriented well-developed HEENT Head.  Right skull craniotomy incision clean and dry Eyes.  Pupils round and reactive to light no discharge.nystagmus Neck.  Supple nontender no JVD without thyromegaly Cardiac regular rate rhythm without any extra sounds or murmur heard Lungs.  Clear to auscultation without wheeze or rhonchi Abdomen.  Soft nontender positive bowel sounds Musculoskeletal.  Right upper extremity deltoids biceps triceps wrist extension grip and finger abduction 5/5 Left upper extremity deltoids 3 -/5, biceps 3 -/5, triceps 3 -/5 otherwise 0 out  of 5 in distal muscles Right lower extremity 5/5 in hip flexors knee extension knee flexion dorsi flexion plantar flexion Left lower extremity hip flexion 3 -/5 knee extension knee flexion 3 -/5 dorsiflexion 0/5 plantar flexion 2 -/5 Neurological alert and oriented x3  /She  has had improvement in activity tolerance, balance, postural control as well as ability to compensate for deficits. [name] [name] has had improvement in functional use RUE/LUE  and RLE/LLE as well as improvement in awareness.  Working with energy conservation techniques.  Sessions focused on gait and functional mobility.  Ambulation 75 250 feet patient was able to ambulate on uneven surfaces around obstacles up-and-down small grades overall contact-guard assist.  Patient was able to safely maneuver rolling walker.  Patient able to manage lifting rolling walker onto curb.  Up and down stairs contact-guard assist.  Patient able to ambulate into the bathroom close supervision doffing close while seated on shower.  Needed some assist for left AFO.  Full family teaching completed plan discharged home       Disposition: Discharged to home    Diet: Regular  Special Instructions: No driving smoking or alcohol  Medications at discharge 1.  Tylenol as needed 2.  Aspirin 81 mg p.o. daily 3.  Lipitor 40 mg p.o. daily 4.  Vitamin D 1000 units p.o. daily 5.  Decadron 2 mg p.o. daily 6.  Keppra 1500 mg p.o. twice daily 7.  Toprol-XL 12.5 mg p.o. daily 8.  Protonix 40 mg p.o. daily 9.  Potassium chloride 20 mEq every other day  30-35 minutes were spent completing discharge summary and discharge planning  Discharge Instructions    Ambulatory referral to Physical Medicine Rehab   Complete by: As directed    Moderate complexity follow-up 1 to 2 weeks recurrent meningioma       Follow-up Information    Kirsteins, Luanna Salk, MD Follow up.   Specialty: Physical Medicine and Rehabilitation Why: Office to call for appointment Contact  information: [place] [address] [phone]        Kathyrn Sheriff,  Nena Polio, MD Follow up.   Specialty: Neurosurgery Why: Call for appointment Contact information: [address] [place] [address] [phone]               Signed: Lavon Paganini [place] 05/07/2020, 5:07 AM

## 2020-05-06 ENCOUNTER — Inpatient Hospital Stay (HOSPITAL_COMMUNITY): Payer: BC Managed Care – PPO | Admitting: Occupational Therapy

## 2020-05-06 ENCOUNTER — Encounter (HOSPITAL_COMMUNITY): Payer: No Typology Code available for payment source | Admitting: *Deleted

## 2020-05-06 ENCOUNTER — Inpatient Hospital Stay (HOSPITAL_COMMUNITY): Payer: BC Managed Care – PPO | Admitting: Physical Therapy

## 2020-05-06 MED ORDER — METOPROLOL SUCCINATE ER 25 MG PO TB24: 12.5000 mg | ORAL_TABLET | Freq: Every day | ORAL | 0 refills | Status: AC

## 2020-05-06 MED ORDER — VITAMIN D3 50 MCG (2000 UT) PO TABS: 1.0000 | ORAL_TABLET | Freq: Every day | ORAL | 0 refills | Status: AC

## 2020-05-06 MED ORDER — LEVETIRACETAM 750 MG PO TABS: 1500.0000 mg | ORAL_TABLET | Freq: Two times a day (BID) | ORAL | 0 refills | Status: DC

## 2020-05-06 MED ORDER — DEXAMETHASONE 2 MG PO TABS: 2.0000 mg | ORAL_TABLET | Freq: Every day | ORAL | 0 refills | Status: DC

## 2020-05-06 MED ORDER — SENNA 8.6 MG PO TABS: 1.0000 | ORAL_TABLET | Freq: Every day | ORAL | 0 refills | Status: DC

## 2020-05-06 MED ORDER — ATORVASTATIN CALCIUM 40 MG PO TABS: 40.0000 mg | ORAL_TABLET | Freq: Every day | ORAL | 0 refills | Status: AC

## 2020-05-06 MED ORDER — ASPIRIN 81 MG PO CHEW: 81.0000 mg | CHEWABLE_TABLET | Freq: Every day | ORAL | Status: AC

## 2020-05-06 MED ORDER — PANTOPRAZOLE SODIUM 40 MG PO TBEC: 40.0000 mg | DELAYED_RELEASE_TABLET | Freq: Every day | ORAL | 0 refills | Status: DC

## 2020-05-06 NOTE — Progress Notes (Signed)
Occupational Therapy Discharge Summary  Patient Details  Name: [name] [name] MRN: [id number] Date of Birth: Mar 09, 1956  Today's Date: 05/06/2020 OT Individual Time: 0903-1000 OT Individual Time Calculation (min): 57 min    Patient has met 14 of 14 long term goals due to improved activity tolerance, improved balance, postural control, ability to compensate for deficits and functional use of  LEFT upper and LEFT lower extremity.  Patient to discharge at overall supervision with min A for LB self care level.  Patient's care partner is independent to provide the necessary physical assistance at discharge.    Reasons goals not met: all goals met  Recommendation:  Patient will benefit from ongoing skilled OT services in outpatient setting to continue to advance functional skills in the area of BADL and iADL.  Equipment: BSC and info/handout provided on recommended purchase of TTB  Reasons for discharge: treatment goals met  Patient/family agrees with progress made and goals achieved: Yes   OT Intervention: Upon entering the room, pt seated in wheelchair with no c/o pain and requesting to shower this session. Pt ambulates to bathroom and doffs clothing items with supervision and increased time. Pt seated on TTB for bathing tasks with bath mitt placed on L hand to increase independence. Pt utilized latera leans to wash buttocks and peri area. Pt exiting the shower and seated on EOB for dressing tasks with focus on hemiplegic techniques and use of figure four position with supervision for UB and min A to assist with donning L AFO and shoe. Pt then utilized RW to return dirty clothing to bottom drawer of dresser with supervision. Pt ambulating from room 100' to day room with supervision and use of RW. Pt engaged in obstacle course with cones and use of RW for tight turns and side stepping as needed with supervision overall. Pt taking seated rest break. Pt then picking up cones from floor with min  verbal cuing for technique and supervision overall. Pt returning to room via wheelchair at end of session. Half lap tray donned for L UE for safety. Call bell and all needed items within reach.   OT Discharge Precautions/Restrictions  Precautions Precautions: Fall General   Vital Signs Therapy Vitals Temp: 98.2 F (36.8 C) Pulse Rate: 84 Resp: 17 BP: 104/64 Patient Position (if appropriate): Lying Oxygen Therapy SpO2: 99 % O2 Device: Room Air Pain Pain Assessment Pain Scale: 0-10 Pain Score: 0-No pain ADL ADL Eating: Not assessed Upper Body Bathing: Moderate assistance Where Assessed-Upper Body Bathing: Shower (tub transfer bench) Lower Body Bathing: Moderate assistance Where Assessed-Lower Body Bathing: Shower (tub transfer bench) Upper Body Dressing: Maximal assistance Where Assessed-Upper Body Dressing: Wheelchair Lower Body Dressing: Maximal assistance Where Assessed-Lower Body Dressing: Wheelchair (sit to stand level) Toileting: Not assessed Toilet Transfer: Not assessed Tub/Shower Transfer: Minimal assistance Social research officer, government: Minimal assistance Social research officer, government Method: Education officer, environmental: Radio broadcast assistant, Grab bars Vision Baseline Vision/History: Wears glasses Wears Glasses: At all times Vision Assessment?: Vision impaired- to be further tested in functional context Eye Alignment: Within Functional Limits Ocular Range of Motion: Within Functional Limits Alignment/Gaze Preference: Within Defined Limits Visual Fields: No apparent deficits Perception    Praxis   Cognition Overall Cognitive Status: Within Functional Limits for tasks assessed Arousal/Alertness: Awake/alert Orientation Level: Oriented X4 Sensation Sensation Light Touch: Appears Intact Proprioception: Appears Intact Coordination Gross Motor Movements are Fluid and Coordinated: No Fine Motor Movements are Fluid and Coordinated: No Coordination and  Movement Description: Hemiplegia UR>LE Motor  Motor Motor: Hemiplegia Motor - Discharge Observations: L sided hemiplegia UE>LE. greatly improved from Eval Mobility  Transfers Sit to Stand: Supervision/Verbal cueing Stand to Sit: Supervision/Verbal cueing  Trunk/Postural Assessment  Cervical Assessment Cervical Assessment: Within Functional Limits Thoracic Assessment Thoracic Assessment: Within Functional Limits Lumbar Assessment Lumbar Assessment: Exceptions to [facility] Lumbar AROM Lumbar - Left Rotation: mild L rotation in stance.  Balance Balance Balance Assessed: Yes Static Sitting Balance Static Sitting - Balance Support: No upper extremity supported Static Sitting - Level of Assistance: 7: Independent Dynamic Sitting Balance Dynamic Sitting - Balance Support: No upper extremity supported Dynamic Sitting - Level of Assistance: 7: Independent Static Standing Balance Static Standing - Balance Support: No upper extremity supported Static Standing - Level of Assistance: 5: Stand by assistance;6: Modified independent (Device/Increase time) Dynamic Standing Balance Dynamic Standing - Balance Support: During functional activity;Left upper extremity supported Dynamic Standing - Level of Assistance: 5: Stand by assistance Extremity/Trunk Assessment RUE Assessment RUE Assessment: Within Functional Limits LUE Assessment LUE Assessment: Exceptions to [facility] Passive Range of Motion (PROM) Comments: WNL General Strength Comments: 3+/5 in bicep, tricep, delt, 2-/5 wrist and fingers   Gypsy Decant 05/06/2020, 2:53 PM

## 2020-05-06 NOTE — Progress Notes (Signed)
Occupational Therapy Session Note  Patient Details  Name: [name] [name] MRN: [id number] Date of Birth: 1956-08-19  Today's Date: 05/06/2020 OT Individual Time: 3382-5053 OT Individual Time Calculation (min): 25 min    Short Term Goals: Week 1:  OT Short Term Goal 1 (Week 1): Pt will dress UB with use of hemi techniques with modA. OT Short Term Goal 1 - Progress (Week 1): Met OT Short Term Goal 2 (Week 1): Pt will complete LB dressing with modA at sit to stand level, demonstrating improved dynamic standing balance. OT Short Term Goal 2 - Progress (Week 1): Met OT Short Term Goal 3 (Week 1): Pt will complete toilet transfer with minA with LRAD. OT Short Term Goal 3 - Progress (Week 1): Met Week 2:  OT Short Term Goal 1 (Week 2): STGs=LTGs secondary to upcoming discharge Week 3:     Skilled Therapeutic Interventions/Progress Updates:    1:1 Pt in bed when arrived. Pt came to EOB (off left side of bed ) with bed rail mod A. CG/ close supervision stand pivot into the w/c. Pt able to retrieve clothing items for a later ADL session at w/c level with supervision to maneuver the w/c. Pt performed oral care in standing position with supervision with demonstrating ability to properly place left Ue in save positions. Pt performed washing face as well. Pt ambulated in and out of bathroom with RW with hand splint with supervision. Pt able to perform clothing management with supervision. Pt left resting in w/c in prep for next session.   Therapy Documentation Precautions:  Precautions Precautions: Fall Restrictions Weight Bearing Restrictions: No General:   Vital Signs: Therapy Vitals Temp: 97.6 F (36.4 C) Temp Source: Oral Pulse Rate: 75 BP: 106/68 Patient Position (if appropriate): Lying Oxygen Therapy SpO2: 100 % O2 Device: Room Air Pain: Pain Assessment Pain Scale: 0-10 Pain Score: 0-No pain   Therapy/Group: Individual Therapy  Willeen Cass [facility] 05/06/2020, 9:07 AM

## 2020-05-06 NOTE — Plan of Care (Signed)
  Problem: Consults Goal: RH BRAIN INJURY PATIENT EDUCATION Description: Description: See Patient Education module for eduction specifics Outcome: Progressing Goal: Skin Care Protocol Initiated - if Braden Score 18 or less Description: If consults are not indicated, leave blank or document N/A Outcome: Progressing Goal: Nutrition Consult-if indicated Outcome: Progressing   Problem: RH BOWEL ELIMINATION Goal: RH STG MANAGE BOWEL WITH ASSISTANCE Description: STG Manage Bowel with mod I  Assistance. Outcome: Progressing Goal: RH STG MANAGE BOWEL W/MEDICATION W/ASSISTANCE Description: STG Manage Bowel with Medication with mod I Assistance. Outcome: Progressing   Problem: RH BLADDER ELIMINATION Goal: RH STG MANAGE BLADDER WITH ASSISTANCE Description: STG Manage Bladder With no Assistance Outcome: Progressing   Problem: RH SKIN INTEGRITY Goal: RH STG ABLE TO PERFORM INCISION/WOUND CARE W/ASSISTANCE Description: STG Able To Perform Incision/Wound Care With  min Assistance. Outcome: Progressing   Problem: RH SAFETY Goal: RH STG ADHERE TO SAFETY PRECAUTIONS W/ASSISTANCE/DEVICE Description: STG Adhere to Safety Precautions With supervision Assistance/Device. Outcome: Progressing Goal: RH STG DECREASED RISK OF FALL WITH ASSISTANCE Description: STG Decreased Risk of Fall With  supervision Assistance. Outcome: Progressing   Problem: RH COGNITION-NURSING Goal: RH STG ANTICIPATES NEEDS/CALLS FOR ASSIST W/ASSIST/CUES Description: STG Anticipates Needs/Calls for Assist With  supervision Assistance/Cues. Outcome: Progressing   Problem: RH PAIN MANAGEMENT Goal: RH STG PAIN MANAGED AT OR BELOW PT'S PAIN GOAL Description: At or below level 3 Outcome: Progressing   Problem: RH KNOWLEDGE DEFICIT BRAIN INJURY Goal: RH STG INCREASE KNOWLEDGE OF SELF CARE AFTER BRAIN INJURY Description: Patient and spouse will be able to manage care at discharge independently using handouts/resources as  references Outcome: Progressing

## 2020-05-06 NOTE — Progress Notes (Signed)
Recreational Therapy Session Note  Patient Details  Name: Ysenia Filice MRN: [id number] Date of Birth: 1956-05-06 Today's Date: 05/06/2020 Time:  1307-1400 Pain: no c/o Skilled Therapeutic Interventions/Progress Updates: Session focused on community reintegration ambulatory level using RW.  Pt participated in a simulated community outing to hospital based [employer] and outdoor uneven surfaces ambulating with RW and supervision.   Pt practiced furniture transfers to various seating surfaces with supervision and problem solved through public restroom access with supervision.  Edcucation  focused on safe community mobility, identification & negotiation of obstacles, how to safely transport items, accessing public restroom, & energy conservation techniques/education.  Pt stated understanding.  Therapy/Group: [employer]  [place] 05/06/2020, 2:21 PM

## 2020-05-06 NOTE — Patient Care Conference (Signed)
Inpatient RehabilitationTeam Conference and Plan of Care Update Date: 05/06/2020   Time: 2:08 PM    Patient Name: [name] [name]      Medical Record Number: [id number]  Date of Birth: [birth year] Sex: Female         Room/Bed: 4W25C/4W25C-01 Payor Info: Payor: [place] / Plan: BCBS COMM PPO / Product Type: *No Product type* /    Admit Date/Time:  04/21/2020  2:36 PM  Primary Diagnosis:  Left hemiparesis [facility])  Patient Active Problem List   Diagnosis Date Noted  . Meningioma ([place]) 04/21/2020  . Left hemiparesis ([place]) 04/21/2020  . Dyslipidemia   . Essential hypertension   . History of tobacco abuse   . History of meningioma   . Leucocytosis   . Acute blood loss anemia   . Brain tumor ([place]) 04/13/2020  . Seizure ([place]) 04/12/2020  . Occlusion and stenosis of carotid artery without mention of cerebral infarction 08/10/2012  . Fibromuscular dysplasia ([place]) 08/10/2012    Expected Discharge Date: Expected Discharge Date: 05/07/20  Team Members Present: Physician leading conference: Dr. Alysia Penna Care Coodinator Present: Dorien Chihuahua, RN, BSN, CRRN;Christina Sampson Goon, [place] Nurse Present: Rayetta Pigg, RN PT Present: Barrie Folk, PT OT Present: Darleen Crocker, OT PPS Coordinator present : Gunnar Fusi, SLP     Current Status/Progress Goal Weekly Team Focus  Bowel/Bladder   continent of bowel and bladder. LBM 6/21  To remain continent.  assess toileting needs qshift and PRN   Swallow/Nutrition/ Hydration             ADL's   supervision overall, min A LB self care, supervision with transfer with RW  supervision overall and min A LB  d/c planning, ADL retraining, balance, functional transfers, L NMR   Mobility   supervision overall for bed mobility, transfers, and gait  supervision assist overall  endurance, ambulation family ed   Communication             Safety/Cognition/ Behavioral Observations            Pain   c/o pain of discomfort and  stiffness in the morning upon waking up. relieved with PRN tylenol.  To remain pain free.  assess pain qshift and PRN   Skin   No skin impairments  remain free of skin impairments  assess skin qshift and PRN    Rehab Goals Patient on target to meet rehab goals: Yes Rehab Goals Revised: patient on target with current goals *See Care Plan and progress notes for long and short-term goals.     Barriers to Discharge  Current Status/Progress Possible Resolutions Date Resolved   Nursing                  PT                    OT                  SLP                Care Coordinator                Discharge Planning/Teaching Needs:  Plans to discharge home  Will schedule education with spouse daughter or sister if reccommended   Team Discussion:  On target to meet supervision goals and min assist with lower body dressing with AFO. Able to ambulate 150-200' with AFO/AD. Family education completed  Revisions to Treatment Plan:  None    Medical  Summary Current Status: Scalp incision healed, no HA, has AFO LLE Weekly Focus/Goal: DC planning  Barriers to Discharge: Pending chemo/radiation  Barriers to Discharge Comments: Conception difficult to set up in her area Possible Resolutions to Barriers: DC in am   Continued Need for Acute Rehabilitation Level of Care: The patient requires daily medical management by a physician with specialized training in physical medicine and rehabilitation for the following reasons: Direction of a multidisciplinary physical rehabilitation program to maximize functional independence : Yes Medical management of patient stability for increased activity during participation in an intensive rehabilitation regime.: Yes Analysis of laboratory values and/or radiology reports with any subsequent need for medication adjustment and/or medical intervention. : Yes   I attest that I was present, lead the team conference, and concur with the assessment and plan of the  team.   Margarito Liner 05/06/2020, 2:08 PM

## 2020-05-06 NOTE — Progress Notes (Signed)
Physical Therapy Discharge Summary  Patient Details  Name: [name] [name] MRN: [id number] Date of Birth: 10-Oct-1956  Today's Date: 05/06/2020 PT Individual Time: 1025-1100 PT Individual Time Calculation (min): 35 min    Patient has met 11 of 11 long term goals due to improved activity tolerance, improved balance, improved postural control, increased strength, increased range of motion, ability to compensate for deficits, functional use of  left upper extremity and left lower extremity and improved coordination.  Patient to discharge at an ambulatory level Supervision.   Patient's care partner is independent to provide the necessary physical assistance at discharge.  Reasons goals not met: All PT goals met   Recommendation:  Patient will benefit from ongoing skilled PT services in outpatient setting to continue to advance safe functional mobility, address ongoing impairments in balance, gait, tranfers, safety, stair management, community access, and minimize fall risk.  Equipment: RW  Reasons for discharge: treatment goals met and discharge from hospital  Patient/family agrees with progress made and goals achieved: Yes   PT Treatment.  Pt received sitting in WC and agreeable to PT.  PT instructed pt in Grad day assessment to measure progress toward goals. See below for details.   Car transfer training with RW to SUV height and supervision assist with cues for AD management and placement. Gait training over unlevel ramp with RW and AFO with supervision assist.   Gait training with RW x 142f with supervision assist as listed below with min cues for step height and knee flexion on the L. Stair management training x 12 with supervision assist and 1 UE support on rail; supervision assist for safety and to provide cues for step to gait pattern.   Pt performed 5xSTS: 16 sec (>15 sec indicates increased fall risk)      PT Discharge Precautions/Restrictions Precautions Precautions:  Fall Vital Signs Therapy Vitals Temp: 98.2 F (36.8 C) Pulse Rate: 84 Resp: 17 BP: 104/64 Patient Position (if appropriate): Lying Oxygen Therapy SpO2: 99 % O2 Device: Room Air Pain Pain Assessment Pain Scale: 0-10 Pain Score: 0-No pain Vision/Perception  Vision - Assessment Eye Alignment: Within Functional Limits Ocular Range of Motion: Within Functional Limits Alignment/Gaze Preference: Within Defined Limits  Cognition Overall Cognitive Status: Within Functional Limits for tasks assessed Arousal/Alertness: Awake/alert Orientation Level: Oriented X4 Sensation Sensation Light Touch: Appears Intact Proprioception: Appears Intact Coordination Gross Motor Movements are Fluid and Coordinated: No Fine Motor Movements are Fluid and Coordinated: No Coordination and Movement Description: Hemiplegia UR>LE Motor  Motor Motor: Hemiplegia Motor - Discharge Observations: L sided hemiplegia UE>LE. greatly improved from Eval  Mobility Bed Mobility Bed Mobility: Rolling Right;Rolling Left;Supine to Sit;Sit to Supine Rolling Right: Independent with assistive device Rolling Left: Independent with assistive device Supine to Sit: Independent with assistive device Sit to Supine: Independent with assistive device Transfers Transfers: Sit to Stand;Stand to Sit;Stand Pivot Transfers Sit to Stand: Independent with assistive device Stand to Sit: Independent with assistive device Stand Pivot Transfers: Supervision/Verbal cueing Transfer (Assistive device): Rolling walker Locomotion  Gait Ambulation: Yes Gait Assistance: Supervision/Verbal cueing Assistive device: Rolling walker Gait Gait Pattern: Impaired Gait Pattern: Decreased step length - right;Poor foot clearance - left Stairs / Additional Locomotion Stairs: Yes Stairs Assistance: Supervision/Verbal cueing Stair Management Technique: No rails Number of Stairs: 12 Height of Stairs: 6 Wheelchair Mobility Wheelchair Mobility:  No  Trunk/Postural Assessment  Cervical Assessment Cervical Assessment: Within Functional Limits Thoracic Assessment Thoracic Assessment: Within Functional Limits Lumbar Assessment Lumbar Assessment: Exceptions to [facility] AROM Lumbar -  Left Rotation: mild L rotation in stance.  Balance Balance Balance Assessed: Yes Static Sitting Balance Static Sitting - Balance Support: No upper extremity supported Static Sitting - Level of Assistance: 7: Independent Dynamic Sitting Balance Dynamic Sitting - Balance Support: No upper extremity supported Dynamic Sitting - Level of Assistance: 7: Independent Static Standing Balance Static Standing - Balance Support: No upper extremity supported Static Standing - Level of Assistance: 5: Stand by assistance;6: Modified independent (Device/Increase time) Dynamic Standing Balance Dynamic Standing - Balance Support: During functional activity;Left upper extremity supported Dynamic Standing - Level of Assistance: 5: Stand by assistance Extremity Assessment  RUE Assessment RUE Assessment: Within Functional Limits LUE Assessment LUE Assessment: Exceptions to [facility] Passive Range of Motion (PROM) Comments: WNL General Strength Comments: 3+/5 in bicep, tricep, delt, 2-/5 wrist and fingers RLE Assessment RLE Assessment: Within Functional Limits LLE Assessment LLE Assessment: Exceptions to [facility] General Strength Comments: grossly 4/5 hip and knee, slightly delayed activaiton. 0/5 DF. 3+/5 PF    Lorie Phenix 05/06/2020, 3:31 PM

## 2020-05-06 NOTE — Progress Notes (Signed)
[place] PHYSICAL MEDICINE & REHABILITATION PROGRESS NOTE   Subjective/Complaints:  Excited about discharge tomorrow asking whether she can get her hair cut on left sid e  ROS: Patient denies CP, SOB, N/V/D   Objective:   No results found. No results for input(s): WBC, HGB, HCT, PLT in the last 72 hours. No results for input(s): NA, K, CL, CO2, GLUCOSE, BUN, CREATININE, CALCIUM in the last 72 hours.  Intake/Output Summary (Last 24 hours) at 05/06/2020 0855 Last data filed at 05/06/2020 1601 Gross per 24 hour  Intake 700 ml  Output --  Net 700 ml     Physical Exam: Vital Signs Blood pressure 106/68, pulse 75, temperature 97.6 F (36.4 C), temperature source Oral, resp. rate 17, height '5\' 3"'$  (1.6 m), weight 74.5 kg, SpO2 100 %.  General: No acute distress Mood and affect are appropriate Heart: Regular rate and rhythm no rubs murmurs or extra sounds Lungs: Clear to auscultation, breathing unlabored, no rales or wheezes Abdomen: Positive bowel sounds, soft nontender to palpation, nondistended Extremities: No clubbing, cyanosis, or edema Skin: No evidence of breakdown, no evidence of rash  Skin: No evidence of breakdown, no evidence of rash, scalp incision CDI- staples removed Neurologic: motor strength is 5/5 in right  deltoid, bicep, tricep, grip, hip flexor, knee extensors, ankle dorsiflexor and plantar flexor 3- left delt , 3- L bi/tri, 3/5 wrist flexor 3- Left KE, HF, 0/5 L ankle DF--wearing PRAFO  Cerebellar exam cannot perform Left finger to nose to finger as well as heel to shindue to weakness  Musculoskeletal: normal PROM, no swelling   Assessment/Plan: 1. Functional deficits secondary to Right frontal meningioma  which require 3+ hours per day of interdisciplinary therapy in a comprehensive inpatient rehab setting.  Physiatrist is providing close team supervision and 24 hour management of active medical problems listed below.  Physiatrist and rehab team continue  to assess barriers to discharge/monitor patient progress toward functional and medical goals  Care Tool:  Bathing    Body parts bathed by patient: Chest, Left arm, Abdomen, Front perineal area, Right upper leg, Left upper leg, Face, Buttocks, Right lower leg, Left lower leg, Right arm   Body parts bathed by helper: Right arm     Bathing assist Assist Level: Supervision/Verbal cueing     Upper Body Dressing/Undressing Upper body dressing   What is the patient wearing?: Bra, Pull over shirt    Upper body assist Assist Level: Supervision/Verbal cueing    Lower Body Dressing/Undressing Lower body dressing      What is the patient wearing?: Pants     Lower body assist Assist for lower body dressing: Supervision/Verbal cueing     Toileting Toileting    Toileting assist Assist for toileting: Supervision/Verbal cueing     Transfers Chair/bed transfer  Transfers assist     Chair/bed transfer assist level: Supervision/Verbal cueing     Locomotion Ambulation   Ambulation assist      Assist level: Supervision/Verbal cueing Assistive device: Walker-rolling Max distance: 2108f   Walk 10 feet activity   Assist     Assist level: Supervision/Verbal cueing Assistive device: Walker-rolling   Walk 50 feet activity   Assist Walk 50 feet with 2 turns activity did not occur: Safety/medical concerns  Assist level: Supervision/Verbal cueing Assistive device: Walker-rolling    Walk 150 feet activity   Assist Walk 150 feet activity did not occur: Safety/medical concerns  Assist level: Supervision/Verbal cueing Assistive device: Walker-rolling    Walk 10 feet on  uneven surface  activity   Assist Walk 10 feet on uneven surfaces activity did not occur: Safety/medical concerns   Assist level: Supervision/Verbal cueing Assistive device: Aeronautical engineer   Type of Wheelchair: Manual    Wheelchair assist level: Moderate  Assistance - Patient 50 - 74% Max wheelchair distance: 150    Wheelchair 50 feet with 2 turns activity    Assist        Assist Level: Moderate Assistance - Patient 50 - 74%   Wheelchair 150 feet activity     Assist      Assist Level: Moderate Assistance - Patient 50 - 74%   Blood pressure 106/68, pulse 75, temperature 97.6 F (36.4 C), temperature source Oral, resp. rate 17, height _0  (1.6 m), weight 74.5 kg, SpO2 100 %.  Medical Problem List and Plan: 1.  Left side weakness secondary to recurrent meningioma status post right frontal craniotomy resection 04/16/2020- previous craniotomy for meningioma was 2014             -patient may shower Rad onc eval as OP              -ELOS/Goals 6/24 discussed date with pt husband and pt  , AFO LLE ordered  -Continue CIR-Team conference today please see physician documentation under team conference tab, met with team  to discuss problems,progress, and goals. Formulized individual treatment plan based on medical history, underlying problem and comorbidities.  2.  Antithrombotics: -DVT/anticoagulation: Subcutaneous heparin discontinued as ambulating >150 feet             -antiplatelet therapy: N/A 3. Pain Management: Tylenol as needed 4. Mood: Provide emotional support. In positive mood.              -antipsychotic agents: N/A 5. Neuropsych: This patient is capable of making decisions on her own behalf. 6. Skin/Wound Care: Routine skin checks, staples removed. 7. Fluids/Electrolytes/Nutrition: Routine in and outs Repeat BMET nl 8.  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  Keppra 1500 mg twice daily 9.  Hypertension.  Toprol 12.5 mg daily.  Monitor with increased mobility Vitals:   05/05/20 2015 05/06/20 0531  BP: 114/60 106/68  Pulse: 86 75  Resp:    Temp: 97.8 F (36.6 C) 97.6 F (36.4 C)  SpO2: 99% 100%  BP controlled 6/23 10.  Constipation.  Currently on  MiraLAX as needed.  Adjust bowel program as needed, last recorded BMs 2 d ago  49.  Hyperlipidemia.  Lipitor 12.  History of tobacco use.  Provide counseling 13. Previous craniotomy with mild L hemiparesis since 2014 for meningioma resection 14.  Leukocytosis without fever will monitor , likely due to decadron, will start to wean Reduce to 56m , will d/c on this dose given plans for rad onc to begin tx LOS: 15 days A FACE TO FBandonE Ruwayda Curet 05/06/2020, 8:55 AM

## 2020-05-06 NOTE — Progress Notes (Signed)
Physical Therapy Session Note  Patient Details  Name: [name] [name] MRN: [id number] Date of Birth: 1956-06-16  Today's Date: 05/06/2020 PT Individual Time: 1307-1400 PT Individual Time Calculation (min): 53 min   Short Term Goals: Week 2:  PT Short Term Goal 1 (Week 2): STG=LTG due to ELOS  Skilled Therapeutic Interventions/Progress Updates: Pt presented in bed agreeable to therapy. Pt denies pain during session. [name] [name], LSW present upon entering room and was discussing with pt availability of OOPT vs HHPT. PTA advised that pt is currently at supervision level and has demonstrated safety with overall mobility so that if pt has transportation to [place] would be safe to do so. Pt verbalized understanding. Pt then performed bed mobility and stand pivot transfer to w/c with supervision. [name] [name], RT then entered room with remaining session focusing on ambulation and activities in "community" setting. Pt transported to [place] entrance and ambulated around atrium, public bathrooms, through entrance and courtyard. Pt was able to perform all activities at supervision level with intermittent rest breaks as needed. Pt then performed outing activity to [employer] and performed with RT. Pt then ambulated from [employer] to elevators supervision level and sat in w/c once inside elevators. Pt transported back to room and remained in w/c to allow pt to eat lunch. Pt left in room at end of session with call bell within reach and needs met.      Therapy Documentation Precautions:  Precautions Precautions: Fall Restrictions Weight Bearing Restrictions: No General:   Vital Signs:   Pain: Pain Assessment Pain Scale: 0-10 Pain Score: 0-No pain Mobility: Bed Mobility Bed Mobility: Rolling Right;Rolling Left;Supine to Sit;Sit to Supine Rolling Right: Independent with assistive device Rolling Left: Independent with assistive device Supine to Sit: Independent with assistive device Sit to Supine: Independent with  assistive device Transfers Transfers: Sit to Stand;Stand to Sit;Stand Pivot Transfers Sit to Stand: Independent with assistive device Stand to Sit: Independent with assistive device Stand Pivot Transfers: Supervision/Verbal cueing Transfer (Assistive device): Rolling walker Locomotion : Gait Ambulation: Yes Gait Assistance: Supervision/Verbal cueing Assistive device: Rolling walker Gait Gait Pattern: Impaired Gait Pattern: Decreased step length - right;Poor foot clearance - left Stairs / Additional Locomotion Stairs: Yes Stairs Assistance: Supervision/Verbal cueing Stair Management Technique: No rails Number of Stairs: 12 Height of Stairs: 6 Wheelchair Mobility Wheelchair Mobility: No  Trunk/Postural Assessment : Cervical Assessment Cervical Assessment: Within Functional Limits Thoracic Assessment Thoracic Assessment: Within Functional Limits Lumbar Assessment Lumbar Assessment: Exceptions to [facility] Lumbar AROM Lumbar - Left Rotation: mild L rotation in stance.  Balance: Balance Balance Assessed: Yes Static Sitting Balance Static Sitting - Balance Support: No upper extremity supported Static Sitting - Level of Assistance: 7: Independent Dynamic Sitting Balance Dynamic Sitting - Balance Support: No upper extremity supported Dynamic Sitting - Level of Assistance: 7: Independent Static Standing Balance Static Standing - Balance Support: No upper extremity supported Static Standing - Level of Assistance: 5: Stand by assistance;6: Modified independent (Device/Increase time) Dynamic Standing Balance Dynamic Standing - Balance Support: During functional activity;Left upper extremity supported Dynamic Standing - Level of Assistance: 5: Stand by assistance Exercises:   Other Treatments:      Therapy/Group: Individual Therapy  [name] [name] 05/06/2020, 5:30 PM

## 2020-05-06 NOTE — Progress Notes (Signed)
Patient ID: [name] [name], female   DOB: 1956/11/03, 64 y.o.   MRN: [id number]   Patient being referred to outpatient

## 2020-05-06 NOTE — Progress Notes (Signed)
Patient ID: [name] [name], female   DOB: [birth year], 64 y.o.   MRN: [id number]  Team Conference Report to Patient/Family  Team Conference discussion was reviewed with the patient and caregiver, including goals, any changes in plan of care and target discharge date.  Patient and caregiver express understanding and are in agreement.  The patient has a target discharge date of 05/07/20.  Dyanne Iha 05/06/2020, 1:59 PM

## 2020-05-07 NOTE — Discharge Instructions (Signed)
Inpatient Rehab Discharge Instructions  [name] [name] Discharge date and time: No discharge date for patient encounter.   Activities/Precautions/ Functional Status: Activity: activity as tolerated Diet: regular diet Wound Care: keep wound clean and dry Functional status:  ___ No restrictions     ___ Walk up steps independently ___ 24/7 supervision/assistance   ___ Walk up steps with assistance ___ Intermittent supervision/assistance  ___ Bathe/dress independently ___ Walk with walker     __x_ Bathe/dress with assistance ___ Walk Independently    ___ Shower independently ___ Walk with assistance    ___ Shower with assistance ___ No alcohol     ___ Return to work/school ________ COMMUNITY REFERRALS UPON DISCHARGE:    Outpatient: PT     OT                 Agency: [facility] Phone: [phone]               Medical Equipment/Items Ordered: Vassie Moselle, Bedside Commode, Tub Transfer Bench                                                 Agency/Supplier: [employer]  Special Instructions: No driving smoking or alcohol`   My questions have been answered and I understand these instructions. I will adhere to these goals and the provided educational materials after my discharge from the hospital.  Patient/Caregiver Signature _______________________________ Date __________  Clinician Signature _______________________________________ Date __________  Please bring this form and your medication list with you to all your follow-up doctor's appointments.

## 2020-05-07 NOTE — Progress Notes (Signed)
Inpatient Rehabilitation Care Coordinator  Discharge Note  The overall goal for the admission was met for:   Discharge location: Yes, Home  Length of Stay: Yes, 16 Days  Discharge activity level: Yes  Home/community participation: Yes  Services provided included: MD, RD, PT, OT, SLP, RN, CM, TR, Pharmacy and SW  Financial Services: Private Insurance: [employer]  Follow-up services arranged: Outpatient: [facility]  Comments (or additional information): PT OT  Patient/Family verbalized understanding of follow-up arrangements: Yes  Individual responsible for coordination of the follow-up plan: [phone]  Confirmed correct DME delivered: Dyanne Iha 05/07/2020    Dyanne Iha

## 2020-05-07 NOTE — Progress Notes (Signed)
Recreational Therapy Discharge Summary Patient Details  Name: [name] [name] MRN: [id number] Date of Birth: Nov 29, 1955 Today's Date: 05/07/2020  Long term goals set: 1  Long term goals met: 1  Comments on progress toward goals: Pt has made great progress during LOS and is excited about discharge home today.  TR sessions focused on activity analysis identifying potential modifications, community resources, community reintegration, & relaxation training/stress management techniques.  Pt is discharging at supervision ambulatory level with RW for community pursuits.  Goal met.  Reasons goals not met: n/c  Equipment acquired: n/a Reasons for discharge: discharge from hospital  Follow-up: Outpatient  Patient/family agrees with progress made and goals achieved: Yes  Danel Studzinski 05/07/2020, 8:35 AM

## 2020-05-07 NOTE — Progress Notes (Signed)
Pt was D/C from the IR unit. Pt left the unit with all personal belongings and all questions answered. Pt states she will attend all follow ups appts. Doy Hutching, RN

## 2020-05-07 NOTE — Progress Notes (Signed)
[facility] PHYSICAL MEDICINE & REHABILITATION PROGRESS NOTE   Subjective/Complaints:  Excited about discharge today  ROS: Patient denies CP, SOB, N/V/D   Objective:   No results found. No results for input(s): WBC, HGB, HCT, PLT in the last 72 hours. No results for input(s): NA, K, CL, CO2, GLUCOSE, BUN, CREATININE, CALCIUM in the last 72 hours.  Intake/Output Summary (Last 24 hours) at 05/07/2020 0826 Last data filed at 05/06/2020 1857 Gross per 24 hour  Intake 560 ml  Output --  Net 560 ml     Physical Exam: Vital Signs Blood pressure 111/61, pulse 74, temperature 97.9 F (36.6 C), resp. rate 17, height 5\' 3"  (1.6 m), weight 74.5 kg, SpO2 100 %.   General: No acute distress Mood and affect are appropriate Heart: Regular rate and rhythm no rubs murmurs or extra sounds Lungs: Clear to auscultation, breathing unlabored, no rales or wheezes Abdomen: Positive bowel sounds, soft nontender to palpation, nondistended Extremities: No clubbing, cyanosis, or edema  Skin: No evidence of breakdown, no evidence of rash, scalp incision CDI- staples removed Neurologic: motor strength is 5/5 in right  deltoid, bicep, tricep, grip, hip flexor, knee extensors, ankle dorsiflexor and plantar flexor 3- left delt , 3- L bi/tri, 3/5 wrist flexor 3- Left KE, HF, 0/5 L ankle DF--wearing PRAFO  Cerebellar exam cannot perform Left finger to nose to finger as well as heel to shindue to weakness  Musculoskeletal: normal PROM, no swelling   Assessment/Plan: 1. Functional deficits secondary to Right frontal meningioma  Stable for D/C today F/u PCP in 3-4 weeks F/u PM&R 2 weeks See D/C summary See D/C instructions Care Tool:  Bathing    Body parts bathed by patient: Chest, Left arm, Abdomen, Front perineal area, Right upper leg, Left upper leg, Face, Buttocks, Right lower leg, Left lower leg, Right arm   Body parts bathed by helper: Right arm     Bathing assist Assist Level:  Supervision/Verbal cueing     Upper Body Dressing/Undressing Upper body dressing   What is the patient wearing?: Bra, Pull over shirt    Upper body assist Assist Level: Supervision/Verbal cueing    Lower Body Dressing/Undressing Lower body dressing      What is the patient wearing?: Pants, Underwear/pull up     Lower body assist Assist for lower body dressing: Supervision/Verbal cueing     Toileting Toileting    Toileting assist Assist for toileting: Supervision/Verbal cueing     Transfers Chair/bed transfer  Transfers assist     Chair/bed transfer assist level: Supervision/Verbal cueing     Locomotion Ambulation   Ambulation assist      Assist level: Supervision/Verbal cueing Assistive device: Walker-rolling Max distance: 180   Walk 10 feet activity   Assist     Assist level: Supervision/Verbal cueing Assistive device: Walker-rolling   Walk 50 feet activity   Assist Walk 50 feet with 2 turns activity did not occur: Safety/medical concerns  Assist level: Supervision/Verbal cueing Assistive device: Walker-rolling    Walk 150 feet activity   Assist Walk 150 feet activity did not occur: Safety/medical concerns  Assist level: Supervision/Verbal cueing Assistive device: Walker-rolling    Walk 10 feet on uneven surface  activity   Assist Walk 10 feet on uneven surfaces activity did not occur: Safety/medical concerns   Assist level: Supervision/Verbal cueing Assistive device: Aeronautical engineer Will patient use wheelchair at discharge?: No Type of Wheelchair: Manual Wheelchair activity did not occur: N/A  Wheelchair assist level: Moderate Assistance - Patient 50 - 74% Max wheelchair distance: 150    Wheelchair 50 feet with 2 turns activity    Assist    Wheelchair 50 feet with 2 turns activity did not occur: N/A   Assist Level: Moderate Assistance - Patient 50 - 74%   Wheelchair 150 feet activity      Assist  Wheelchair 150 feet activity did not occur: N/A   Assist Level: Moderate Assistance - Patient 50 - 74%   Blood pressure 111/61, pulse 74, temperature 97.9 F (36.6 C), resp. rate 17, height 5\' 3"  (1.6 m), weight 74.5 kg, SpO2 100 %.  Medical Problem List and Plan: 1.  Left side weakness secondary to recurrent meningioma status post right frontal craniotomy resection 04/16/2020- previous craniotomy for meningioma was 2014             -patient may shower Rad onc eval as OP              -stable for d/c today     2.  Antithrombotics: -DVT/anticoagulation: Subcutaneous heparin discontinued as ambulating >150 feet             -antiplatelet therapy: N/A 3. Pain Management: Tylenol as needed 4. Mood: Provide emotional support. In positive mood.              -antipsychotic agents: N/A 5. Neuropsych: This patient is capable of making decisions on her own behalf. 6. Skin/Wound Care: Routine skin checks, staples removed. 7. Fluids/Electrolytes/Nutrition: Routine in and outs Repeat BMET nl 8.  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  Keppra 1500 mg twice daily 9.  Hypertension.  Toprol 12.5 mg daily.  Monitor with increased mobility Vitals:   05/06/20 1939 05/07/20 0526  BP: 119/61 111/61  Pulse: 87 74  Resp:    Temp: 98.7 F (37.1 C) 97.9 F (36.6 C)  SpO2: 98% 100%  BP controlled 6/24 10.  Constipation.  Currently on MiraLAX as needed.  Adjust bowel program as needed, last recorded BMs 2 d ago  49.  Hyperlipidemia.  Lipitor 12.  History of tobacco use.  Provide counseling 13. Previous craniotomy with mild L hemiparesis since 2014 for meningioma resection 14.  Leukocytosis without fever will monitor , likely due to decadron, will start to wean Reduce to 2mg  , will d/c on this dose given plans for rad onc to begin tx LOS: 16 days A FACE TO FACE EVALUATION WAS PERFORMED  Charlett Blake 05/07/2020, 8:26 AM

## 2020-05-11 ENCOUNTER — Telehealth: Payer: Self-pay | Admitting: Registered Nurse

## 2020-05-11 NOTE — Telephone Encounter (Signed)
Transitional Care call  Patient name: [name] [name] DOB: [birth year]  1. Are you/is patient experiencing any problems since coming home? No a. Are there any questions regarding any aspect of care? No 2. Are there any questions regarding medications administration/dosing? No a. Are meds being taken as prescribed? Yes b. "Patient should review meds with caller to confirm"  Medication List Reviewed 3. Have there been any falls? No 4. Has Home Health been to the house and/or have they contacted you? Scheduled for Therapy at [facility]. a. If not, have you tried to contact them? NA b. Can we help you contact them? NA 5. Are bowels and bladder emptying properly? Yes a. Are there any unexpected incontinence issues? No b. If applicable, is patient following bowel/bladder programs? NA 6. Any fevers, problems with breathing, unexpected pain? No 7. Are there any skin problems or new areas of breakdown? No 8. Has the patient/family member arranged specialty MD follow up (ie cardiology/neurology/renal/surgical/etc.)?  [name] was instructed to call Dr Kathyrn Sheriff and her PCP to schedule HFU appointment, she verbalizes understanding.  a. Can we help arrange? No 9. Does the patient need any other services or support that we can help arrange? No 10. Are caregivers following through as expected in assisting the patient? Yes 11. Has the patient quit smoking, drinking alcohol, or using drugs as recommended? (                        )  Appointment date/time [name] appointment changed due to her transportation. She will be scheduled with Dr Letta Pate on 06/05/2020, to arrive at 1:00 for 1:15 appointment  With Dr Letta Pate, as she requested. At  [place]

## 2020-05-13 ENCOUNTER — Encounter: Payer: BC Managed Care – PPO | Admitting: Registered Nurse

## 2020-05-14 NOTE — Progress Notes (Addendum)
Location/Histology of Brain Tumor: Meningioma  Patient presented 04/12/2020 with left sided weakness and seizures.  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  These measure up to 21 mm.  Otherwise evolution of postoperative changes. Including some post ischemic gyriform enhancement of the right frontal lobe. Overlying postoperative subdural fluid, with resolved pneumocephalus.  No new intracranial abnormality  MRI Brain 04/17/2020: Postoperative changes of extensive resection of recurrent meningioma.  Possible two small areas of residual tumor.  MRI Brain 04/13/2020: Recurrent meningioma along the right frontoparietal convexity and falx with some contralateral extension.  Compression and possible invasion of the adjacent superior sagittal sinus which remain patent.   Craniotomy 04/16/2020     Past or anticipated interventions, if any, per neurosurgery:  Dr.  Kathyrn Sheriff -Right frontal craniotomy resection of meningioma 04/16/2020   Past or anticipated interventions, if any, per medical oncology:   Dose of Decadron, if applicable: 2 mg daily  Recent neurologic symptoms, if any:   Seizures: None since before surgery.  Headaches: No  Nausea: No  Dizziness/ataxia: No  Difficulty with hand coordination: Left sided weak  Focal numbness/weakness: Left sided  Visual deficits/changes: No  Confusion/Memory deficits: No   SAFETY ISSUES:  Prior radiation? No  Pacemaker/ICD? No  Possible current pregnancy? Hysterectomy  Is the patient on methotrexate? No  Additional Complaints / other details: -Previous craniotomy with mild left hemiparesis 2014 for meningioma resection.

## 2020-05-15 ENCOUNTER — Ambulatory Visit
Admit: 2020-05-15 | Discharge: 2020-05-15 | Disposition: A | Discharge status: Home / Self Care | Payer: BC Managed Care – PPO | Attending: Radiation Oncology | Admitting: Radiation Oncology

## 2020-05-15 DIAGNOSIS — D329 Benign neoplasm of meninges, unspecified: Secondary | ICD-10-CM

## 2020-05-15 MED ORDER — GADOBENATE DIMEGLUMINE 529 MG/ML IV SOLN
15.0000 mL | Freq: Once | INTRAVENOUS | Status: AC | PRN
  Administered 2020-05-15: 15 mL via INTRAVENOUS

## 2020-05-19 ENCOUNTER — Ambulatory Visit
Admission: RE | Admit: 2020-05-19 | Discharge: 2020-05-19 | Disposition: A | Discharge status: Home / Self Care | Payer: BC Managed Care – PPO | Source: Ambulatory Visit | Attending: Radiation Oncology | Admitting: Radiation Oncology

## 2020-05-19 ENCOUNTER — Ambulatory Visit
Admit: 2020-05-19 | Discharge: 2020-05-19 | Disposition: A | Discharge status: Home / Self Care | Payer: BC Managed Care – PPO | Source: Ambulatory Visit | Attending: Radiation Oncology | Admitting: Radiation Oncology

## 2020-05-19 ENCOUNTER — Other Ambulatory Visit: Payer: Self-pay

## 2020-05-19 ENCOUNTER — Encounter: Payer: Self-pay | Admitting: Radiation Oncology

## 2020-05-19 VITALS — BP 128/74 | HR 90 | Temp 98.5°F | Resp 18 | Ht 63.0 in | Wt 169.1 lb

## 2020-05-19 DIAGNOSIS — Z87891 Personal history of nicotine dependence: Secondary | ICD-10-CM | POA: Diagnosis not present

## 2020-05-19 DIAGNOSIS — I2510 Atherosclerotic heart disease of native coronary artery without angina pectoris: Secondary | ICD-10-CM | POA: Diagnosis not present

## 2020-05-19 DIAGNOSIS — Z7982 Long term (current) use of aspirin: Secondary | ICD-10-CM | POA: Insufficient documentation

## 2020-05-19 DIAGNOSIS — R51 Headache with orthostatic component, not elsewhere classified: Secondary | ICD-10-CM | POA: Diagnosis not present

## 2020-05-19 DIAGNOSIS — D329 Benign neoplasm of meninges, unspecified: Secondary | ICD-10-CM | POA: Insufficient documentation

## 2020-05-19 DIAGNOSIS — Z510 Encounter for antineoplastic radiation therapy: Secondary | ICD-10-CM | POA: Diagnosis present

## 2020-05-19 DIAGNOSIS — E785 Hyperlipidemia, unspecified: Secondary | ICD-10-CM | POA: Insufficient documentation

## 2020-05-19 DIAGNOSIS — Z79899 Other long term (current) drug therapy: Secondary | ICD-10-CM | POA: Insufficient documentation

## 2020-05-19 DIAGNOSIS — Z8673 Personal history of transient ischemic attack (TIA), and cerebral infarction without residual deficits: Secondary | ICD-10-CM | POA: Diagnosis not present

## 2020-05-19 MED ORDER — SODIUM CHLORIDE 0.9% FLUSH
10.0000 mL | Freq: Once | INTRAVENOUS | Status: AC
  Administered 2020-05-19: 10 mL via INTRAVENOUS

## 2020-05-19 NOTE — Progress Notes (Signed)
Has armband been applied?  Yes  Does patient have an allergy to IV contrast dye?: No   Has patient ever received premedication for IV contrast dye?: n/a  Does patient take metformin?: No  If patient does take metformin when was the last dose: n/a  Date of lab work: 04/22/2020 BUN: 17 CR: 0.76 EGfr: >60  IV site: Right AC  Has IV site been added to flowsheet?  Yes

## 2020-05-19 NOTE — Addendum Note (Signed)
Encounter addended by: Kyung Rudd, MD on: 05/19/2020 3:48 PM  Actions taken: Clinical Note Signed

## 2020-05-19 NOTE — Progress Notes (Addendum)
Radiation Oncology         [phone] ________________________________  Name: [name] [name] MRN: [id number]  Date: 05/19/2020  DOB: [birth year]  QV:[id number], Virgina Evener, MD  Consuella Lose, MD     REFERRING PHYSICIAN: Consuella Lose, MD   DIAGNOSIS: The encounter diagnosis was Meningioma [facility]). WHO grade I   HISTORY OF PRESENT ILLNESS::[name] [name] is a 64 y.o. female who is seen for an initial consultation visit regarding the patient's diagnosis of meningioma.  The patient previously had a low-grade meningioma in 2014 for which she underwent resection.  Recent imaging showed 2 areas of recurrent meningioma after the patient developed some left-sided weakness and seizures.  She proceeded with resection of these 2 areas which again revealed WHO grade 1 meningioma.  Postoperative imaging reveals significant resection of the lesions.  The imaging has been carefully reviewed in multidisciplinary brain conference and to particular foci are suspicious for residual disease which are close to each other and at midline superiorly.  In conference, we discussed the recommendation to proceed with adjuvant radiation treatment to these areas.  The patient states that she is recovering from surgery, doing quite well with rehab.  Given her history, the patient is motivated to try to minimize the chance of recurrence as much as possible and therefore we had a good discussion today regarding adjuvant radiation treatment.    PREVIOUS RADIATION THERAPY: No   PAST MEDICAL HISTORY:  has a past medical history of Bruises easily, Carotid artery occlusion, Dizziness, Headache(784.0), Hyperlipidemia, PONV (postoperative nausea and vomiting), Stroke ([place]), and TIA (transient ischemic attack).     PAST SURGICAL HISTORY: Past Surgical History:  Procedure Laterality Date  . ABDOMINAL HYSTERECTOMY  2002  . APPLICATION OF CRANIAL NAVIGATION N/A 04/16/2020   Procedure: APPLICATION OF CRANIAL NAVIGATION;   Surgeon: Consuella Lose, MD;  Location: [place];  Service: Neurosurgery;  Laterality: N/A;  . COLONOSCOPY    . CRANIOTOMY Right 09/10/2013   Procedure: Craniotomy for tumor excision;  Surgeon: Consuella Lose, MD;  Location: [place] NEURO ORS;  Service: Neurosurgery;  Laterality: Right;  Craniotomy for meningioma with stealth  . CRANIOTOMY N/A 04/16/2020   Procedure: STEREOTACTIC CRANIOTOMY FOR RESECTION OF MENINGIOMA;  Surgeon: Consuella Lose, MD;  Location: [place];  Service: Neurosurgery;  Laterality: N/A;  . DILATION AND CURETTAGE OF UTERUS  1980  . left hand surgery  2007 and 2008   plates and screws     FAMILY HISTORY: family history includes Cancer in her father; Deep vein thrombosis in her father.   SOCIAL HISTORY:  reports that she has quit smoking. She has never used smokeless tobacco. She reports that she does not drink alcohol and does not use drugs.   ALLERGIES: Adhesive [tape]   MEDICATIONS:  Current Outpatient Medications  Medication Sig Dispense Refill  . acetaminophen (TYLENOL) 325 MG tablet Take 650 mg by mouth every 6 (six) hours as needed for moderate pain or headache.    Marland Kitchen aspirin 81 MG chewable tablet Chew 1 tablet (81 mg total) by mouth daily.    Marland Kitchen atorvastatin (LIPITOR) 40 MG tablet Take 1 tablet (40 mg total) by mouth daily. 30 tablet 0  . Cholecalciferol (VITAMIN D3) 50 MCG (2000 UT) TABS Take 1 tablet by mouth daily. 30 tablet 0  . dexamethasone (DECADRON) 2 MG tablet Take 1 tablet (2 mg total) by mouth daily. 30 tablet 0  . levETIRAcetam (KEPPRA) 750 MG tablet Take 2 tablets (1,500 mg total) by mouth 2 (two) times daily. 180 tablet  0  . metoprolol succinate (TOPROL-XL) 25 MG 24 hr tablet Take 0.5 tablets (12.5 mg total) by mouth daily. 30 tablet 0  . OVER THE COUNTER MEDICATION Take 1 tablet by mouth daily. Vit B Stress Tablet    . pantoprazole (PROTONIX) 40 MG tablet Take 1 tablet (40 mg total) by mouth daily with supper. 30 tablet 0  . potassium chloride  SA (K-DUR,KLOR-CON) 20 MEQ tablet Take 20 mEq by mouth every other day.    . senna (SENOKOT) 8.6 MG TABS tablet Take 1 tablet (8.6 mg total) by mouth at bedtime. 120 tablet 0   No current facility-administered medications for this encounter.     REVIEW OF SYSTEMS:  A 15 point review of systems is documented in the electronic medical record. This was obtained by the nursing staff. However, I reviewed this with the patient to discuss relevant findings and make appropriate changes.  Pertinent items are noted in HPI.    PHYSICAL EXAM:  height is 5\' 3"  (1.6 m) and weight is 169 lb 2 oz (76.7 kg). Her oral temperature is 98.5 F (36.9 C). Her blood pressure is 128/74 and her pulse is 90. Her respiration is 18 and oxygen saturation is 100%.   ECOG = 2  0 - Asymptomatic (Fully active, able to carry on all predisease activities without restriction)  1 - Symptomatic but completely ambulatory (Restricted in physically strenuous activity but ambulatory and able to carry out work of a light or sedentary nature. For example, light housework, office work)  2 - Symptomatic, <50% in bed during the day (Ambulatory and capable of all self care but unable to carry out any work activities. Up and about more than 50% of waking hours)  3 - Symptomatic, >50% in bed, but not bedbound (Capable of only limited self-care, confined to bed or chair 50% or more of waking hours)  4 - Bedbound (Completely disabled. Cannot carry on any self-care. Totally confined to bed or chair)  5 - Death   Eustace Pen MM, Creech RH, Tormey DC, et al. [phone]). "Toxicity and response criteria of the [facility]". [place] Oncol. 5 (6): 649-55  Alert and oriented x3, no acute distress The surgical area has healed well without any sign of infection or poor wound healing, incision fully closed   LABORATORY DATA:  Lab Results  Component Value Date   WBC 16.0 (H) 04/22/2020   HGB 11.8 (L) 04/22/2020   HCT 36.8  04/22/2020   MCV 93.9 04/22/2020   PLT 291 04/22/2020   Lab Results  Component Value Date   NA 140 04/22/2020   K 4.1 04/22/2020   CL 105 04/22/2020   CO2 26 04/22/2020   Lab Results  Component Value Date   ALT 37 04/22/2020   AST 20 04/22/2020   ALKPHOS 64 04/22/2020   BILITOT 0.6 04/22/2020      RADIOGRAPHY: MR Brain W Wo Contrast  Result Date: 05/16/2020 CLINICAL DATA:  64 year old female status post surgery last month for resection of recurrent right anterior convexity meningiomas, parasagittal and right frontal. Two possible small areas of residual tumor on postoperative MRI. Restaging. EXAM: MRI HEAD WITHOUT AND WITH CONTRAST TECHNIQUE: Multiplanar, multiecho pulse sequences of the brain and surrounding structures were obtained without and with intravenous contrast. CONTRAST:  12mL MULTIHANCE GADOBENATE DIMEGLUMINE 529 MG/ML IV SOLN COMPARISON:  04/17/2020 and earlier. FINDINGS: Brain: Sequelae of right craniotomy again noted with more conspicuous postoperative subdural fluid collection over the right superior convexity,  up to roughly 9 mm in thickness (series 10, image 23). Previously seen pneumocephalus appears resolved. Decreased diffusion restriction but new cortical enhancement of the right superior frontal gyrus compatible with evolving ischemia (series 12, image 24). Superimposed regional hemosiderin. Patchy T2 and FLAIR hyperintensity more resembling developing encephalomalacia than cerebral edema. There are 2 nearly adjacent nodular areas of enhancing soft tissue at the falx and superior sagittal sinus suspicious for small foci of residual tumor. These are: -21 by 12 by 9 mm (AP by transverse by CC) on series 11, image 43 and series 12, image 22, with unchanged focal occlusion of the superior sagittal sinus. -and a smaller 10 x 7 by 11 mm area on series 11, image 138 and series 12, image 27. Other mild dural thickening appears to be postoperative in nature. No other masslike areas  of enhancement are identified. No midline shift. No ventriculomegaly. Normal basilar cisterns. No new areas of restricted diffusion to suggest new infarct. Outside of the resection area gray and white matter signal is within normal limits. No other cerebral blood products. Negative pituitary and cervicomedullary junction. Vascular: Major intracranial vascular flow voids are stable. Chronic superior sagittal sinus invasion (series 10, image 23 today), but elsewhere the sinus and other major dural venous structures appear to be enhancing and patent. Skull and upper cervical spine: Negative visible cervical spine. Craniotomy changes with normal background bone marrow signal. Sinuses/Orbits: Negative. Other: Mastoids are clear. Visible internal auditory structures appear normal. Postoperative changes to the scalp. IMPRESSION: 1. 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 These measure up to 21 mm (series 11, image 143) and 11 mm (image 138). 2. Otherwise evolution of postoperative changes. Including some post ischemic gyriform enhancement of the right frontal lobe. Overlying postoperative subdural fluid, with resolved pneumocephalus. 3. No new intracranial abnormality. Electronically Signed   By: Genevie Ann M.D.   On: 05/16/2020 07:44       IMPRESSION/PLAN:  The patient is status post resection of a recurrent WHO grade 1 meningioma.  Substantial resection was achieved with 2 foci of potential residual disease which has been carefully reviewed in brain conference.  The recommendation is to proceed with adjuvant radiation treatment to try to reduce the chance of local failure.  We discussed today the rationale of such a treatment as well as the possible side effects and risks.  All of her questions were answered and she does wish to proceed with treatment.  We discussed the potential course of radiosurgery.  This likely will be able to  be carried out in 1 fraction although we discussed today that this will depend on final planning.  All of her questions were answered and she does wish to proceed with treatment.  She will undergo simulation later today for treatment planning and she is tentatively scheduled for 1 fraction of SRS later this week on Friday.   The patient was seen in person today in clinic.  The total time spent on the patient's visit today was 23, including chart review, direct discussion/evaluation with the patient, and coordination of care.     ________________________________   Jodelle Gross, MD, PhD   **Disclaimer: This note was dictated with voice recognition software. Similar sounding words can inadvertently be transcribed and this note may contain transcription errors which may not have been corrected upon publication of note.**

## 2020-05-21 DIAGNOSIS — Z510 Encounter for antineoplastic radiation therapy: Secondary | ICD-10-CM | POA: Diagnosis not present

## 2020-05-22 ENCOUNTER — Other Ambulatory Visit: Payer: Self-pay

## 2020-05-22 ENCOUNTER — Ambulatory Visit
Admission: RE | Admit: 2020-05-22 | Discharge: 2020-05-22 | Disposition: A | Discharge status: Home / Self Care | Payer: BC Managed Care – PPO | Source: Ambulatory Visit | Attending: Radiation Oncology | Admitting: Radiation Oncology

## 2020-05-22 ENCOUNTER — Encounter: Payer: Self-pay | Admitting: Radiation Oncology

## 2020-05-22 DIAGNOSIS — Z510 Encounter for antineoplastic radiation therapy: Secondary | ICD-10-CM | POA: Diagnosis not present

## 2020-05-22 NOTE — Progress Notes (Signed)
[name] [name] rested with [name] [name] for 30 minutes following her [name] [name] treatment.  Patient denies headache, dizziness, nausea, diplopia or ringing in the ears. Denies fatigue. Patient without complaints. Understands to avoid strenuous activity for the next 24 hours and call [phone] with needs. She was given Decadron taper instructions.  Patient verbalized understanding as well as teach back performed.  BP 119/67   Pulse 79   Temp 98.8 F (37.1 C)   Resp 16   SpO2 100%   [name] [name], BSN

## 2020-05-22 NOTE — Progress Notes (Signed)
Decadron Taper Instructions  05/23/2020: Take 1/2 tablet (1mg ) once a day for one week.  05/30/2020: Take 1/2 tablet (1mg ) every other day for one week.  Stop on 06/06/2020

## 2020-05-28 DIAGNOSIS — Z6830 Body mass index (BMI) 30.0-30.9, adult: Secondary | ICD-10-CM | POA: Insufficient documentation

## 2020-06-05 ENCOUNTER — Encounter: Payer: Self-pay | Admitting: Physical Medicine & Rehabilitation

## 2020-06-05 ENCOUNTER — Other Ambulatory Visit: Payer: Self-pay

## 2020-06-05 ENCOUNTER — Encounter: Payer: BC Managed Care – PPO | Attending: Registered Nurse | Admitting: Physical Medicine & Rehabilitation

## 2020-06-05 VITALS — BP 118/75 | HR 96 | Temp 97.8°F | Ht 63.0 in | Wt 172.2 lb

## 2020-06-05 DIAGNOSIS — D329 Benign neoplasm of meninges, unspecified: Secondary | ICD-10-CM | POA: Diagnosis present

## 2020-06-05 DIAGNOSIS — G8194 Hemiplegia, unspecified affecting left nondominant side: Secondary | ICD-10-CM | POA: Insufficient documentation

## 2020-06-05 NOTE — Progress Notes (Signed)
Subjective:    Patient ID: [name] [name], female    DOB: 11/24/1955, 64 y.o.   MRN: [id number] 64 y.o. right-handed female with history of hyperlipidemia, hypertension, tobacco abuse, craniotomy for tumor excision meningioma 2014.  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  Presented 04/12/2020 with left-sided weakness and seizure.  Patient on no antiepileptic medication prior to admission.  Admission chemistries unremarkable.  EEG negative for seizure.  MRI showed recurrent meningioma along the right frontoparietal convexity and falx with some contralateral extension.  Compression and possible invasion of the adjacent superior sagittal sinus which remain patent.  Patient underwent stereotactic right frontal craniotomy resection of meningioma 04/16/2020 per Dr. Kathyrn Sheriff.  Maintained on Keppra for seizure prophylaxis as well as Decadron protocol.  Subcutaneous heparin initiated for DVT prophylaxis 04/19/2020.  Tolerating a regular diet.  Therapy evaluations completed and patient was admitted for a comprehensive rehab program   Admit date: 04/21/2020 Discharge date: 05/07/2020  HPI  64 year old female with recurrent right frontoparietal meningioma follows up after inpatient rehab hospitalization.  She has been doing relatively well at home she is dressing and bathing herself.  She is not driving due to deficits plus seizure which prompted further medical evaluation.  We discussed that her neurosurgeon will determine duration of future medicine as well as return to driving.  Problems with Relaxing fingers on left side, the patient does have some stiffness at the elbow and forearm as well.  She also feels like she has problems with coordination in the left hand. Tapering decadron Finishing radiation therapy MRI in Oct , seen by Neurosurgery , Dr Ralene Ok  Pain Inventory Average Pain 4 Pain Right Now No pain My pain is headache pain yesterday.   In the last 24 hours, has pain  interfered with the following? General activity 0 Relation with others 0 Enjoyment of life 0 What TIME of day is your pain at its worst? morning Sleep (in general) Fair  Pain is worse with: pain just happens. Pain improves with: medication Relief from Meds: Tylenol takes it away.  Mobility use a cane how many minutes can you walk? 600 feet yesterday at PT. ability to climb steps?  yes do you drive?  no Do you have any goals in this area?  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 Do you have any goals in this area?  no  Neuro/Psych weakness numbness tingling trouble walking  Prior Studies New Patient  Physicians involved in your care Any changes since last visit?  yes New Patient   Family History  Problem Relation Age of Onset  . Cancer Father        Prostate  . Deep vein thrombosis Father    Social History   Socioeconomic History  . Marital status: Married    Spouse name: Not on file  . Number of children: Not on file  . Years of education: Not on file  . Highest education level: Not on file  Occupational History  . Not on file  Tobacco Use  . Smoking status: Former Research scientist (life sciences)  . Smokeless tobacco: Never Used  . Tobacco comment: quit 65yrs ago  Vaping Use  . Vaping Use: Never used  Substance and Sexual Activity  . Alcohol use: No  . Drug use: No  . Sexual activity:  Not Currently    Birth control/protection: Surgical  Other Topics Concern  . Not on file  Social History Narrative  . Not on file   Social Determinants of Health   Financial Resource Strain:   . Difficulty of Paying Living Expenses:   Food Insecurity:   . Worried About Charity fundraiser in the Last Year:   . Arboriculturist in the Last Year:   Transportation Needs:   . Film/video editor  (Medical):   Marland Kitchen Lack of Transportation (Non-Medical):   Physical Activity:   . Days of Exercise per Week:   . Minutes of Exercise per Session:   Stress:   . Feeling of Stress :   Social Connections:   . Frequency of Communication with Friends and Family:   . Frequency of Social Gatherings with Friends and Family:   . Attends Religious Services:   . Active Member of Clubs or Organizations:   . Attends Archivist Meetings:   Marland Kitchen Marital Status:    Past Surgical History:  Procedure Laterality Date  . ABDOMINAL HYSTERECTOMY  2002  . APPLICATION OF CRANIAL NAVIGATION N/A 04/16/2020   Procedure: APPLICATION OF CRANIAL NAVIGATION;  Surgeon: Consuella Lose, MD;  Location: [facility];  Service: Neurosurgery;  Laterality: N/A;  . COLONOSCOPY    . CRANIOTOMY Right 09/10/2013   Procedure: Craniotomy for tumor excision;  Surgeon: Consuella Lose, MD;  Location: [facility];  Service: Neurosurgery;  Laterality: Right;  Craniotomy for meningioma with stealth  . CRANIOTOMY N/A 04/16/2020   Procedure: STEREOTACTIC CRANIOTOMY FOR RESECTION OF MENINGIOMA;  Surgeon: Consuella Lose, MD;  Location: [facility];  Service: Neurosurgery;  Laterality: N/A;  . DILATION AND CURETTAGE OF UTERUS  1980  . left hand surgery  2007 and 2008   plates and screws   Past Medical History:  Diagnosis Date  . Bruises easily   . Carotid artery occlusion   . Dizziness   . Headache(784.0)   . Hyperlipidemia    takes Atorvastatin daily  . PONV (postoperative nausea and vomiting)   . Stroke ([place])   . TIA (transient ischemic attack)    BP 118/75   Pulse 96   Temp 97.8 F (36.6 C)   Ht 5\' 3"  (1.6 m)   Wt 172 lb 3.2 oz (78.1 kg)   SpO2 95%   BMI 30.50 kg/m   Opioid Risk Score:   Fall Risk Score:  `1  Depression screen PHQ 2/9  Depression screen PHQ 2/9 06/05/2020  Decreased Interest 0  Down, Depressed, Hopeless 0  PHQ - 2 Score 0  Altered sleeping 0  Tired, decreased energy 0  Change in appetite 0   Feeling bad or failure about yourself  0  Trouble concentrating 0  Moving slowly or fidgety/restless 0  Suicidal thoughts 0  PHQ-9 Score 0   Review of Systems  Constitutional: Negative.   HENT: Negative.   Eyes: Negative.   Cardiovascular: Negative.   Gastrointestinal: Negative.   Endocrine: Negative.   Genitourinary: Negative.   Musculoskeletal: Negative.   Skin: Negative.   Allergic/Immunologic: Negative.   Neurological: Positive for weakness and numbness.       Tingling  All other systems reviewed and are negative.      Objective:   Physical Exam Vitals and nursing note reviewed.  Constitutional:      Appearance: Normal appearance.  Eyes:     Extraocular Movements: Extraocular movements intact.     Conjunctiva/sclera: Conjunctivae normal.  Pupils: Pupils are equal, round, and reactive to light.  Skin:    General: Skin is warm and dry.  Neurological:     General: No focal deficit present.     Mental Status: She is alert and oriented to person, place, and time. Mental status is at baseline.  Psychiatric:        Mood and Affect: Mood normal.        Behavior: Behavior normal.        Thought Content: Thought content normal.        Judgment: Judgment normal.    Tone left upper extremity MAS 2 Elbow flexors MAS 3 at the forearm pronators, MAS 1-2 in the finger flexors, MAS 2 at the wrist flexors Motor strength is 4 - at the left deltoid bicep tricep finger flexors and extensors 4+ at the left hip flexor knee extensor and 3 - at the left ankle dorsiflexor Right side has normal strength Ambulates with left AFO as well as a small base quad cane no evidence of toe drag or knee instability.       Assessment & Plan:  #1.  Recurrent right frontoparietal meningioma, overall having excellent recovery of function, residual left upper extremity motor coordination deficits she does have some elevated tone particularly at the elbow flexors and forearm pronators.  We  discussed possibly trying botulinum toxin to these muscles however made it clear that this would not be helpful for fine motor coordination problems.  She would like to think about this.  She will continue outpatient therapy I will see her back in 6 weeks In terms of driving she will need follow-up neurosurgery Return to return to work we can discuss further at next visit, she will not be driving for at least 6 months from the time of procedure.  She used to work as an Corporate treasurer

## 2020-06-05 NOTE — Patient Instructions (Addendum)
OnabotulinumtoxinA injection (Medical Use) What is this medicine? ONABOTULINUMTOXINA (o na BOTT you lye num tox in eh) is a neuro-muscular blocker. This medicine is used to treat crossed eyes, eyelid spasms, severe neck muscle spasms, ankle and toe muscle spasms, and elbow, wrist, and finger muscle spasms. It is also used to treat excessive underarm sweating, to prevent chronic migraine headaches, and to treat loss of bladder control due to neurologic conditions such as multiple sclerosis or spinal cord injury. This medicine may be used for other purposes; ask your health care provider or pharmacist if you have questions. COMMON BRAND NAME(S): Botox What should I tell my health care provider before I take this medicine? They need to know if you have any of these conditions:  breathing problems  cerebral palsy spasms  difficulty urinating  heart problems  history of surgery where this medicine is going to be used  infection at the site where this medicine is going to be used  myasthenia gravis or other neurologic disease  nerve or muscle disease  surgery plans  take medicines that treat or prevent blood clots  thyroid problems  an unusual or allergic reaction to botulinum toxin, albumin, other medicines, foods, dyes, or preservatives  pregnant or trying to get pregnant  breast-feeding How should I use this medicine? This medicine is for injection into a muscle. It is given by a health care professional in a hospital or clinic setting. Talk to your pediatrician regarding the use of this medicine in children. While this drug may be prescribed for children as young as 2 years old for selected conditions, precautions do apply. Overdosage: If you think you have taken too much of this medicine contact a poison control center or emergency room at once. NOTE: This medicine is only for you. Do not share this medicine with others. What if I miss a dose? This does not apply. What may  interact with this medicine?  aminoglycoside antibiotics like gentamicin, neomycin, tobramycin  muscle relaxants  other botulinum toxin injections This list may not describe all possible interactions. Give your health care provider a list of all the medicines, herbs, non-prescription drugs, or dietary supplements you use. Also tell them if you smoke, drink alcohol, or use illegal drugs. Some items may interact with your medicine. What should I watch for while using this medicine? Visit your doctor for regular check ups. This medicine will cause weakness in the muscle where it is injected. Tell your doctor if you feel unusually weak in other muscles. Get medical help right away if you have problems with breathing, swallowing, or talking. This medicine might make your eyelids droop or make you see blurry or double. If you have weak muscles or trouble seeing do not drive a car, use machinery, or do other dangerous activities. This medicine contains albumin from human blood. It may be possible to pass an infection in this medicine, but no cases have been reported. Talk to your doctor about the risks and benefits of this medicine. If your activities have been limited by your condition, go back to your regular routine slowly after treatment with this medicine. What side effects may I notice from receiving this medicine? Side effects that you should report to your doctor or health care professional as soon as possible:  allergic reactions like skin rash, itching or hives, swelling of the face, lips, or tongue  breathing problems  changes in vision  chest pain or tightness  eye irritation, pain  fast, irregular heartbeat    infection  numbness  speech problems  swallowing problems  unusual weakness Side effects that usually do not require medical attention (report to your doctor or health care professional if they continue or are bothersome):  bruising or pain at site where  injected  drooping eyelid  dry eyes or mouth  headache  muscles aches, pains  sensitivity to light  tearing This list may not describe all possible side effects. Call your doctor for medical advice about side effects. You may report side effects to FDA at 1-800-FDA-1088. Where should I keep my medicine? This drug is given in a hospital or clinic and will not be stored at home. NOTE: This sheet is a summary. It may not cover all possible information. If you have questions about this medicine, talk to your doctor, pharmacist, or health care provider.  2020 Elsevier/Gold Standard (2018-05-07 14:21:42)  

## 2020-06-22 ENCOUNTER — Ambulatory Visit
Admission: RE | Admit: 2020-06-22 | Discharge: 2020-06-22 | Disposition: A | Discharge status: Home / Self Care | Payer: BC Managed Care – PPO | Source: Ambulatory Visit | Attending: Radiation Oncology | Admitting: Radiation Oncology

## 2020-06-22 DIAGNOSIS — D329 Benign neoplasm of meninges, unspecified: Secondary | ICD-10-CM

## 2020-06-22 DIAGNOSIS — R569 Unspecified convulsions: Secondary | ICD-10-CM

## 2020-06-22 NOTE — Progress Notes (Signed)
  Radiation Oncology         [phone]) [phone] ________________________________  Name: [name] [name] MRN: [id number]  Date of Service: 06/22/2020  DOB: February 13, 1956  Post Treatment Telephone Note  Diagnosis:   WHO grade 1 meningioma  Interval Since Last Radiation:  5 weeks   05/22/20 Postop SRS Treatment: PTV1 15 Gy in 1 fraction to the target along the right frontal/parasagital region.  Narrative:  The patient was contacted today for routine follow-up. During treatment she did very well with radiotherapy and did not have significant desquamation. She reports she is doing okay since her treatment. She continues to see difficulty with strength in her left side despite working with PT and rehabilitation medicine. She is trying to increase the intensity of her PT exercises over the next 6 weeks. She is hoping to avoid botox injections that were offered.  Impression/Plan: 1. WHO grade 1 meningioma. The patient has been doing well since completion of radiotherapy. We discussed that we would be happy to continue to follow her as needed, but I think she will be best suited to follow along with Dr. Mickeal Skinner in neuro oncology clinic. She is still taking keppra and will need long term management of this and works with rehab medicine and is going to try to continue the next 6 weeks of being aggressive with PT goals.     Carola Rhine, PAC

## 2020-06-27 NOTE — Progress Notes (Signed)
  Radiation Oncology         [phone] ________________________________  Name: [name] [name] MRN: [id number]  Date: 05/22/2020  DOB: [birth year]  End of Treatment Note  Diagnosis:   Meningioma ([place]). WHO grade I     Indication for treatment:  palliative       Radiation treatment dates:   05/22/20  Site/dose:    Postop high risk target: 4 Vmat beams, maximum dose 119.2%  Narrative: The patient tolerated radiation treatment well.   There were no signs of acute toxicity after treatment.  Plan: The patient has completed radiation treatment. The patient will return to radiation oncology clinic for routine followup in one month. I advised the patient to call or return sooner if they have any questions or concerns related to their recovery or treatment. ________________________________  ------------------------------------------------  Jodelle Gross, MD, PhD

## 2020-07-10 ENCOUNTER — Inpatient Hospital Stay: Payer: BC Managed Care – PPO | Attending: Neurosurgery | Admitting: Internal Medicine

## 2020-07-10 ENCOUNTER — Other Ambulatory Visit: Payer: Self-pay

## 2020-07-10 VITALS — BP 124/75 | HR 79 | Temp 97.7°F | Resp 19 | Ht 63.0 in | Wt 175.0 lb

## 2020-07-10 DIAGNOSIS — Z79899 Other long term (current) drug therapy: Secondary | ICD-10-CM | POA: Insufficient documentation

## 2020-07-10 DIAGNOSIS — E785 Hyperlipidemia, unspecified: Secondary | ICD-10-CM | POA: Insufficient documentation

## 2020-07-10 DIAGNOSIS — Z87891 Personal history of nicotine dependence: Secondary | ICD-10-CM | POA: Diagnosis not present

## 2020-07-10 DIAGNOSIS — D320 Benign neoplasm of cerebral meninges: Secondary | ICD-10-CM | POA: Diagnosis present

## 2020-07-10 DIAGNOSIS — Z8673 Personal history of transient ischemic attack (TIA), and cerebral infarction without residual deficits: Secondary | ICD-10-CM | POA: Insufficient documentation

## 2020-07-10 DIAGNOSIS — R569 Unspecified convulsions: Secondary | ICD-10-CM | POA: Diagnosis not present

## 2020-07-10 DIAGNOSIS — Z7982 Long term (current) use of aspirin: Secondary | ICD-10-CM | POA: Insufficient documentation

## 2020-07-10 DIAGNOSIS — D329 Benign neoplasm of meninges, unspecified: Secondary | ICD-10-CM

## 2020-07-10 NOTE — Progress Notes (Signed)
[facility] [place], [place] [address] [phone]   New Patient Evaluation  Date of Service: 07/10/20 Patient Name: [name] [name] Patient MRN: [id number] Patient DOB: 07/28/1956 Provider: Ventura Sellers, MD  Identifying Statement:  [name] [name] is a 64 y.o. female with multifocal meningioma who presents for initial consultation and evaluation.    Referring Provider: Curlene Labrum, MD [place],  [place] [address]  Oncologic History: 09/10/13: Craniotomy, resection with Dr. Kathyrn Sheriff. 04/21/20: R frontal and falcine recurrence, repeat resection debulkeing with Nundkumar 05/22/20: SRS to residual tumor along falx and sinus with Dr. Tammi Klippel  Biomarkers:  MGMT Unknown.  IDH 1/2 Unknown.  EGFR Unknown  TERT Unknown   History of Present Illness: The patient's records from the referring physician were obtained and reviewed and the patient interviewed to confirm this HPI.  [name] [name] presents today for neurologic evaluation given history of treated recurrent meningioma.  Treatment history is per above.  This past June she presented with a focal seizure (staring, shaking) after not having had a brain MRI for several years.  Imaging demonstrated large recurrent dural based masses along right frontal and superior falx.  She went for debulking resection on 04/21/20, which left residual tumor approximating the superior saggital sinus only.  This was treated with radiosurgery on 05/22/20 with Dr. Tammi Klippel.  Since recurrence and surgery, she has struggled with left sided weakness.  Overall strength has improved markedly and currently she can left her arm over head.  There is some weakness and clumsiness with the arm, as well as stiffness.  Stiffness and weakness are more prominent in the leg, as she require cane and leg brace for ambulation.  Continues to work hard with physical therapy.  No recurrence of seizures since starting  Keppra in June.     Medications: Current Outpatient Medications on File Prior to Visit  Medication Sig Dispense Refill  . acetaminophen (TYLENOL) 325 MG tablet Take 650 mg by mouth every 6 (six) hours as needed for moderate pain or headache.    Marland Kitchen aspirin 81 MG chewable tablet Chew 1 tablet (81 mg total) by mouth daily.    Marland Kitchen atorvastatin (LIPITOR) 40 MG tablet Take 1 tablet (40 mg total) by mouth daily. 30 tablet 0  . Cholecalciferol (VITAMIN D3) 50 MCG (2000 UT) TABS Take 1 tablet by mouth daily. 30 tablet 0  . levETIRAcetam (KEPPRA) 750 MG tablet Take 2 tablets (1,500 mg total) by mouth 2 (two) times daily. 180 tablet 0  . metoprolol succinate (TOPROL-XL) 25 MG 24 hr tablet Take 0.5 tablets (12.5 mg total) by mouth daily. 30 tablet 0  . OVER THE COUNTER MEDICATION Take 1 tablet by mouth daily. Vit B Stress Tablet    . potassium chloride SA (K-DUR,KLOR-CON) 20 MEQ tablet Take 20 mEq by mouth every other day.    . senna (SENOKOT) 8.6 MG TABS tablet Take 1 tablet (8.6 mg total) by mouth at bedtime. 120 tablet 0   No current facility-administered medications on file prior to visit.    Allergies:  Allergies  Allergen Reactions  . Adhesive [Tape]     Rash popped up after using bandaids, telemetry stickers.   Past Medical History:  Past Medical History:  Diagnosis Date  . Bruises easily   . Carotid artery occlusion   . Dizziness   . Headache(784.0)   . Hyperlipidemia    takes Atorvastatin daily  . PONV (postoperative nausea and  vomiting)   . Stroke ([place])   . TIA (transient ischemic attack)    Past Surgical History:  Past Surgical History:  Procedure Laterality Date  . ABDOMINAL HYSTERECTOMY  2002  . APPLICATION OF CRANIAL NAVIGATION N/A 04/16/2020   Procedure: APPLICATION OF CRANIAL NAVIGATION;  Surgeon: Consuella Lose, MD;  Location: [facility];  Service: Neurosurgery;  Laterality: N/A;  . COLONOSCOPY    . CRANIOTOMY Right 09/10/2013   Procedure: Craniotomy for tumor excision;   Surgeon: Consuella Lose, MD;  Location: [facility];  Service: Neurosurgery;  Laterality: Right;  Craniotomy for meningioma with stealth  . CRANIOTOMY N/A 04/16/2020   Procedure: STEREOTACTIC CRANIOTOMY FOR RESECTION OF MENINGIOMA;  Surgeon: Consuella Lose, MD;  Location: [facility];  Service: Neurosurgery;  Laterality: N/A;  . DILATION AND CURETTAGE OF UTERUS  1980  . left hand surgery  2007 and 2008   plates and screws   Social History:  Social History   Socioeconomic History  . Marital status: Married    Spouse name: Not on file  . Number of children: Not on file  . Years of education: Not on file  . Highest education level: Not on file  Occupational History  . Not on file  Tobacco Use  . Smoking status: Former Research scientist (life sciences)  . Smokeless tobacco: Never Used  . Tobacco comment: quit 1yr ago  Vaping Use  . Vaping Use: Never used  Substance and Sexual Activity  . Alcohol use: No  . Drug use: No  . Sexual activity: Not Currently    Birth control/protection: Surgical  Other Topics Concern  . Not on file  Social History Narrative  . Not on file   Social Determinants of Health   Financial Resource Strain:   . Difficulty of Paying Living Expenses: Not on file  Food Insecurity:   . Worried About RCharity fundraiserin the Last Year: Not on file  . Ran Out of Food in the Last Year: Not on file  Transportation Needs:   . Lack of Transportation (Medical): Not on file  . Lack of Transportation (Non-Medical): Not on file  Physical Activity:   . Days of Exercise per Week: Not on file  . Minutes of Exercise per Session: Not on file  Stress:   . Feeling of Stress : Not on file  Social Connections:   . Frequency of Communication with Friends and Family: Not on file  . Frequency of Social Gatherings with Friends and Family: Not on file  . Attends Religious Services: Not on file  . Active Member of Clubs or Organizations: Not on file  . Attends CArchivistMeetings: Not on  file  . Marital Status: Not on file  Intimate Partner Violence:   . Fear of Current or Ex-Partner: Not on file  . Emotionally Abused: Not on file  . Physically Abused: Not on file  . Sexually Abused: Not on file   Family History:  Family History  Problem Relation Age of Onset  . Cancer Father        Prostate  . Deep vein thrombosis Father     Review of Systems: Constitutional: Doesn't report fevers, chills or abnormal weight loss Eyes: Doesn't report blurriness of vision Ears, nose, mouth, throat, and face: Doesn't report sore throat Respiratory: Doesn't report cough, dyspnea or wheezes Cardiovascular: Doesn't report palpitation, chest discomfort  Gastrointestinal:  Doesn't report nausea, constipation, diarrhea GU: Doesn't report incontinence Skin: Doesn't report skin rashes Neurological: Per HPI Musculoskeletal: Doesn't report  joint pain Behavioral/Psych: Doesn't report anxiety  Physical Exam: Vitals:   07/10/20 1130  BP: 124/75  Pulse: 79  Resp: 19  Temp: 97.7 F (36.5 C)  SpO2: 99%   KPS: 70. General: Alert, cooperative, pleasant, in no acute distress Head: Normal EENT: No conjunctival injection or scleral icterus.  Lungs: Resp effort normal Cardiac: Regular rate Abdomen: Non-distended abdomen Skin: No rashes cyanosis or petechiae. Extremities: No clubbing or edema  Neurologic Exam: Mental Status: Awake, alert, attentive to examiner. Oriented to self and environment. Language is fluent with intact comprehension.  Cranial Nerves: Visual acuity is grossly normal. Visual fields are full. Extra-ocular movements intact. No ptosis. Face is symmetric Motor: Tone and bulk are normal. Power is full in both arms and legs. Reflexes are symmetric, no pathologic reflexes present.  Sensory: Intact to light touch Gait: Normal.   Labs: I have reviewed the data as listed    Component Value Date/Time   NA 140 04/22/2020 0532   K 4.1 04/22/2020 0532   CL 105 04/22/2020  0532   CO2 26 04/22/2020 0532   GLUCOSE 104 (H) 04/22/2020 0532   BUN 17 04/22/2020 0532   CREATININE 0.76 04/22/2020 0532   CALCIUM 9.0 04/22/2020 0532   PROT 5.9 (L) 04/22/2020 0532   ALBUMIN 3.0 (L) 04/22/2020 0532   AST 20 04/22/2020 0532   ALT 37 04/22/2020 0532   ALKPHOS 64 04/22/2020 0532   BILITOT 0.6 04/22/2020 0532   GFRNONAA >60 04/22/2020 0532   GFRAA >60 04/22/2020 0532   Lab Results  Component Value Date   WBC 16.0 (H) 04/22/2020   NEUTROABS 8.7 (H) 04/22/2020   HGB 11.8 (L) 04/22/2020   HCT 36.8 04/22/2020   MCV 93.9 04/22/2020   PLT 291 04/22/2020    Pathology: SURGICAL PATHOLOGY  CASE: [id number]  PATIENT: Chasta Shrode  Surgical Pathology Report   Clinical History: meningioma (cm)   FINAL MICROSCOPIC DIAGNOSIS:   A. BRAIN TUMOR, RIGHT FRONTAL, RESECTION:  - Meningioma, WHO grade I/III.   B. BRAIN TUMOR, LOWER RIGHT FRONTAL, RESECTION:  - Meningioma, WHO grade I/III.    GROSS DESCRIPTION:   A. Received in saline is a 5.6 x 4.2 x 2.6 cm, 28 g portion of tan-pink  to tan-brown lobulated tissue with disrupted membranous tan-white dura  at 1 surface. Sectioning reveals tan-pink firm cut surfaces.  Representative sections are submitted in 6 cassettes.   B. Received in saline is a 3.5 x 2.2 x 1.2 cm portion of tan-pink  lobulated tissue with focally attached tan-white membranous dura.  Sectioning reveals soft tan-pink cut surfaces. Representative sections  are submitted in 2 cassettes. (AK 04/17/2020)    Assessment/Plan Meningioma ([place]) [D32.9]  We appreciate the opportunity to participate in the care of [name] [name].  She is clinically improving with regards to motor function following aggressive rehab interventions.  Remains seizure free with Keppra 766m BID.  We recommended she return to clinic with a contrast enhanced brain MRI for evaluation in 2 months, to evaluate initial response to radiosurgery.    We spent twenty  additional minutes teaching regarding the natural history, biology, and historical experience in the treatment of brain tumors. We then discussed in detail the current recommendations for therapy focusing on the mode of administration, mechanism of action, anticipated toxicities, and quality of life issues associated with this plan. We also provided teaching sheets for the patient to take home as an additional resource.  All questions were answered. The patient knows to call the  clinic with any problems, questions or concerns. No barriers to learning were detected.  The total time spent in the encounter was 45 minutes and more than 50% was on counseling and review of test results   Ventura Sellers, MD Medical Director of Neuro-Oncology [facility] 07/10/20 3:54 PM

## 2020-07-13 ENCOUNTER — Telehealth: Payer: Self-pay | Admitting: Internal Medicine

## 2020-07-13 NOTE — Telephone Encounter (Signed)
Scheduled per 8/27 los. Pt requested fridays only due to transportation. Pt is aware of appt time and date.

## 2020-07-17 ENCOUNTER — Other Ambulatory Visit: Payer: Self-pay

## 2020-07-17 ENCOUNTER — Encounter: Payer: BC Managed Care – PPO | Attending: Registered Nurse | Admitting: Physical Medicine & Rehabilitation

## 2020-07-17 ENCOUNTER — Encounter: Payer: Self-pay | Admitting: Physical Medicine & Rehabilitation

## 2020-07-17 VITALS — BP 142/78 | HR 89 | Temp 98.8°F | Ht 63.0 in | Wt 177.8 lb

## 2020-07-17 DIAGNOSIS — G8194 Hemiplegia, unspecified affecting left nondominant side: Secondary | ICD-10-CM | POA: Diagnosis present

## 2020-07-17 DIAGNOSIS — D329 Benign neoplasm of meninges, unspecified: Secondary | ICD-10-CM | POA: Diagnosis present

## 2020-07-17 NOTE — Patient Instructions (Addendum)
[facility]  Please call if you need forms filled out

## 2020-07-17 NOTE — Progress Notes (Signed)
Subjective:    Patient ID: [name] [name], female    DOB: Aug 01, 1956, 64 y.o.   MRN: [id number] 64 y.o. right-handed female with history of hyperlipidemia, hypertension, tobacco abuse, craniotomy for tumor excision meningioma 2014.  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  Presented 04/12/2020 with left-sided weakness and seizure.  Patient on no antiepileptic medication prior to admission.  Admission chemistries unremarkable.  EEG negative for seizure.  MRI showed recurrent meningioma along the right frontoparietal convexity and falx with some contralateral extension.  Compression and possible invasion of the adjacent superior sagittal sinus which remain patent.  Patient underwent stereotactic right frontal craniotomy resection of meningioma 04/16/2020 per Dr. Kathyrn Sheriff.  Maintained on Keppra for seizure prophylaxis as well as Decadron protocol.  Subcutaneous heparin initiated for DVT prophylaxis 04/19/2020.  Tolerating a regular diet.  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 General activity 2 Relation with others 0 Enjoyment of life 0 What TIME of day is your pain at its worst? morning , daytime, evening and night Sleep (in general) Fair  Pain is worse with: unsure Pain improves with: heat/ice Relief from Meds: 7  Family History  Problem Relation Age of Onset  . Cancer Father        Prostate  . Deep vein thrombosis Father    Social History   Socioeconomic History  . Marital status: Married    Spouse name: Not on file  . Number  of children: Not on file  . Years of education: Not on file  . Highest education level: Not on file  Occupational History  . Not on file  Tobacco Use  . Smoking status: Former Research scientist (life sciences)  . Smokeless tobacco: Never Used  . Tobacco comment: quit 60yrs ago  Vaping Use  . Vaping Use: Never used  Substance and Sexual Activity  . Alcohol use: No  . Drug use: No  . Sexual activity: Not Currently    Birth control/protection: Surgical  Other Topics Concern  . Not on file  Social History Narrative  . Not on file   Social Determinants of Health   Financial Resource Strain:   . Difficulty of Paying Living Expenses: Not on file  Food Insecurity:   . Worried About Charity fundraiser in the Last Year: Not on file  . Ran Out of Food in the Last Year: Not on file  Transportation Needs:   . Lack of Transportation (Medical): Not on file  . Lack of Transportation (Non-Medical): Not on file  Physical Activity:   . Days of Exercise per Week: Not on file  . Minutes of Exercise per Session: Not on file  Stress:   . Feeling of Stress : Not on file  Social Connections:   . Frequency of Communication with Friends and Family: Not on file  . Frequency of Social Gatherings with Friends and Family: Not on file  . Attends Religious Services: Not on file  . Active Member of Clubs or Organizations: Not on file  . Attends Club  or Organization Meetings: Not on file  . Marital Status: Not on file   Past Surgical History:  Procedure Laterality Date  . ABDOMINAL HYSTERECTOMY  2002  . APPLICATION OF CRANIAL NAVIGATION N/A 04/16/2020   Procedure: APPLICATION OF CRANIAL NAVIGATION;  Surgeon: Consuella Lose, MD;  Location: [facility];  Service: Neurosurgery;  Laterality: N/A;  . COLONOSCOPY    . CRANIOTOMY Right 09/10/2013   Procedure: Craniotomy for tumor excision;  Surgeon: Consuella Lose, MD;  Location: [place] [facility];  Service: Neurosurgery;  Laterality: Right;  Craniotomy for meningioma with stealth  .  CRANIOTOMY N/A 04/16/2020   Procedure: STEREOTACTIC CRANIOTOMY FOR RESECTION OF MENINGIOMA;  Surgeon: Consuella Lose, MD;  Location: [facility];  Service: Neurosurgery;  Laterality: N/A;  . DILATION AND CURETTAGE OF UTERUS  1980  . left hand surgery  2007 and 2008   plates and screws   Past Surgical History:  Procedure Laterality Date  . ABDOMINAL HYSTERECTOMY  2002  . APPLICATION OF CRANIAL NAVIGATION N/A 04/16/2020   Procedure: APPLICATION OF CRANIAL NAVIGATION;  Surgeon: Consuella Lose, MD;  Location: [facility];  Service: Neurosurgery;  Laterality: N/A;  . COLONOSCOPY    . CRANIOTOMY Right 09/10/2013   Procedure: Craniotomy for tumor excision;  Surgeon: Consuella Lose, MD;  Location: [place];  Service: Neurosurgery;  Laterality: Right;  Craniotomy for meningioma with stealth  . CRANIOTOMY N/A 04/16/2020   Procedure: STEREOTACTIC CRANIOTOMY FOR RESECTION OF MENINGIOMA;  Surgeon: Consuella Lose, MD;  Location: [facility];  Service: Neurosurgery;  Laterality: N/A;  . DILATION AND CURETTAGE OF UTERUS  1980  . left hand surgery  2007 and 2008   plates and screws   Past Medical History:  Diagnosis Date  . Bruises easily   . Carotid artery occlusion   . Dizziness   . Headache(784.0)   . Hyperlipidemia    takes Atorvastatin daily  . PONV (postoperative nausea and vomiting)   . Stroke ([place])   . TIA (transient ischemic attack)    BP (!) 142/78   Pulse 89   Temp 98.8 F (37.1 C)   Ht 5\' 3"  (1.6 m)   Wt 177 lb 12.8 oz (80.6 kg)   SpO2 97%   BMI 31.50 kg/m   Opioid Risk Score:   Fall Risk Score:  `1  Depression screen PHQ 2/9  Depression screen [facility] 2/9 07/17/2020 06/05/2020  Decreased Interest 0 0  Down, Depressed, Hopeless 0 0  PHQ - 2 Score 0 0  Altered sleeping - 0  Tired, decreased energy - 0  Change in appetite - 0  Feeling bad or failure about yourself  - 0  Trouble concentrating - 0  Moving slowly or fidgety/restless - 0  Suicidal thoughts - 0  PHQ-9 Score - 0     Review of Systems  Constitutional: Negative.   HENT: Negative.   Eyes: Negative.   Respiratory: Negative.   Cardiovascular: Negative.   Gastrointestinal: Negative.   Endocrine: Negative.   Genitourinary: Negative.   Musculoskeletal: Negative.   Skin: Negative.   Allergic/Immunologic: Negative.   Neurological: Positive for numbness.       Tingling  Hematological: Negative.   Psychiatric/Behavioral: Negative.   All other systems reviewed and are negative.      Objective:   Physical Exam Vitals and nursing note reviewed.  Constitutional:      Appearance: She is obese.  HENT:     Head: Normocephalic and atraumatic.  Neurological:     General: No focal deficit present.  Mental Status: She is alert.     Comments: [facility]  Psychiatric:        Mood and Affect: Mood normal.        Behavior: Behavior normal.   Motor strength is 5/5 in the right deltoid bicep tricep grip hip flexor knee extensor ankle dorsiflexor 3 - in the left deltoid bicep tricep grip 4/5 in left hip flexor knee extensor, 3 - ankle dorsiflexion plantar flexor Tone, no evidence of significant spasticity in the left upper extremity.  JAMAR position 2 RIght hand 60lb, Left hand 15lb  Ambulates with a cane and AFO    Assessment & Plan:  #1. 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

## 2020-08-03 ENCOUNTER — Other Ambulatory Visit: Payer: Self-pay | Admitting: Radiation Therapy

## 2020-08-07 ENCOUNTER — Other Ambulatory Visit: Payer: Self-pay | Admitting: Internal Medicine

## 2020-08-14 ENCOUNTER — Ambulatory Visit
Admission: RE | Admit: 2020-08-14 | Discharge: 2020-08-14 | Disposition: A | Discharge status: Home / Self Care | Payer: BC Managed Care – PPO | Source: Ambulatory Visit | Attending: Internal Medicine | Admitting: Internal Medicine

## 2020-08-14 DIAGNOSIS — D329 Benign neoplasm of meninges, unspecified: Secondary | ICD-10-CM

## 2020-08-14 MED ORDER — GADOBENATE DIMEGLUMINE 529 MG/ML IV SOLN
15.0000 mL | Freq: Once | INTRAVENOUS | Status: AC | PRN
  Administered 2020-08-14: 15 mL via INTRAVENOUS

## 2020-09-09 NOTE — Op Note (Signed)
Name: [name] [name]    MRN: [id number]   Date: 05/22/2020    DOB: 1955/11/29   STEREOTACTIC RADIOSURGERY OPERATIVE NOTE  PRE-OPERATIVE DIAGNOSIS:  Recurrent WHO I Meningioma  POST-OPERATIVE DIAGNOSIS:  Same  PROCEDURE:  Stereotactic Radiosurgery  SURGEON:  Consuella Lose, MD  RADIATION ONCOLOGIST: Dr. Kyung Rudd, MD  TECHNIQUE:  The patient underwent a radiation treatment planning session in the radiation oncology simulation suite under the care of the radiation oncology physician and physicist.  I participated closely in the radiation treatment planning afterwards. The patient underwent planning CT which was fused to 3T high resolution MRI with 1 mm axial slices.  These images were fused on the planning system.  We contoured the gross target volumes and subsequently expanded this to yield the Planning Target Volume. I actively participated in the planning process.  I helped to define and review the target contours and also the contours of the optic pathway, eyes, brainstem and selected nearby organs at risk.  All the dose constraints for critical structures were reviewed and compared to AAPM Task Group 101.  The prescription dose conformity was reviewed.  I approved the plan electronically.    Accordingly, [name] [name]  was brought to the TrueBeam stereotactic radiation treatment linac and placed in the custom immobilization mask.  The patient was aligned according to the IR fiducial markers with BrainLab Exactrac, then orthogonal x-rays were used in ExacTrac with the 6DOF robotic table and the shifts were made to align the patient  [name] [name] received stereotactic radiosurgery to recurrent meningioma sites uneventfully.  The detailed description of the procedure is recorded in the radiation oncology procedure note.  I was present for the duration of the procedure.  DISPOSITION:   Following delivery, the patient was transported to nursing in stable condition and monitored for  possible acute effects to be discharged to home in stable condition with follow-up in one month.  Consuella Lose, MD [facility]

## 2020-09-11 ENCOUNTER — Other Ambulatory Visit: Payer: Self-pay

## 2020-09-11 ENCOUNTER — Telehealth: Payer: Self-pay | Admitting: Internal Medicine

## 2020-09-11 ENCOUNTER — Inpatient Hospital Stay: Payer: BC Managed Care – PPO | Attending: Neurosurgery | Admitting: Internal Medicine

## 2020-09-11 VITALS — BP 142/84 | HR 89 | Temp 97.8°F | Resp 18 | Ht 63.0 in | Wt 173.2 lb

## 2020-09-11 DIAGNOSIS — Z7982 Long term (current) use of aspirin: Secondary | ICD-10-CM | POA: Insufficient documentation

## 2020-09-11 DIAGNOSIS — D329 Benign neoplasm of meninges, unspecified: Secondary | ICD-10-CM

## 2020-09-11 DIAGNOSIS — E785 Hyperlipidemia, unspecified: Secondary | ICD-10-CM | POA: Diagnosis not present

## 2020-09-11 DIAGNOSIS — Z87891 Personal history of nicotine dependence: Secondary | ICD-10-CM | POA: Diagnosis not present

## 2020-09-11 DIAGNOSIS — Z8673 Personal history of transient ischemic attack (TIA), and cerebral infarction without residual deficits: Secondary | ICD-10-CM | POA: Diagnosis not present

## 2020-09-11 DIAGNOSIS — D320 Benign neoplasm of cerebral meninges: Secondary | ICD-10-CM | POA: Insufficient documentation

## 2020-09-11 DIAGNOSIS — R569 Unspecified convulsions: Secondary | ICD-10-CM | POA: Diagnosis not present

## 2020-09-11 DIAGNOSIS — Z79899 Other long term (current) drug therapy: Secondary | ICD-10-CM | POA: Insufficient documentation

## 2020-09-11 NOTE — Telephone Encounter (Signed)
Scheduled per 10/29 los. Printed avs and calendar for pt.

## 2020-09-11 NOTE — Progress Notes (Signed)
[facility] [place], [place] [address] [phone]   Interval Evaluation  Date of Service: 09/11/20 Patient Name: [name] [name] Patient MRN: [id number] Patient DOB: 1956-05-26 Provider: Ventura Sellers, MD  Identifying Statement:  [name] [name] is a 64 y.o. female with multifocal meningioma  Oncologic History: 09/10/13: Craniotomy, resection with Dr. Kathyrn Sheriff. 04/21/20: R frontal and falcine recurrence, repeat resection debulkeing with Nundkumar 05/22/20: SRS to residual tumor along falx and sinus with Dr. Tammi Klippel   Interval History:  [name] [name] presents today for follow up.  No new or progressive neurologic deficits.  No further seizure since the event in June.  Continues to work hard with physical therapy, although sessions have run out.  Denies headaches.  Medications: Current Outpatient Medications on File Prior to Visit  Medication Sig Dispense Refill  . acetaminophen (TYLENOL) 325 MG tablet Take 650 mg by mouth every 6 (six) hours as needed for moderate pain or headache.    Marland Kitchen aspirin 81 MG chewable tablet Chew 1 tablet (81 mg total) by mouth daily.    Marland Kitchen atorvastatin (LIPITOR) 40 MG tablet Take 1 tablet (40 mg total) by mouth daily. 30 tablet 0  . Cholecalciferol (VITAMIN D3) 50 MCG (2000 UT) TABS Take 1 tablet by mouth daily. 30 tablet 0  . levETIRAcetam (KEPPRA) 750 MG tablet Take 2 tablets (1,500 mg total) by mouth 2 (two) times daily. 180 tablet 0  . metoprolol succinate (TOPROL-XL) 25 MG 24 hr tablet Take 0.5 tablets (12.5 mg total) by mouth daily. 30 tablet 0  . OVER THE COUNTER MEDICATION Take 1 tablet by mouth daily. Vit B Stress Tablet    . potassium chloride SA (K-DUR,KLOR-CON) 20 MEQ tablet Take 20 mEq by mouth every other day.    . senna (SENOKOT) 8.6 MG TABS tablet Take 1 tablet (8.6 mg total) by mouth at bedtime. 120 tablet 0   No current facility-administered medications on file prior to visit.     Allergies:  Allergies  Allergen Reactions  . Adhesive [Tape]     Rash popped up after using bandaids, telemetry stickers.   Past Medical History:  Past Medical History:  Diagnosis Date  . Bruises easily   . Carotid artery occlusion   . Dizziness   . Headache(784.0)   . Hyperlipidemia    takes Atorvastatin daily  . PONV (postoperative nausea and vomiting)   . Stroke ([place])   . TIA (transient ischemic attack)    Past Surgical History:  Past Surgical History:  Procedure Laterality Date  . ABDOMINAL HYSTERECTOMY  2002  . APPLICATION OF CRANIAL NAVIGATION N/A 04/16/2020   Procedure: APPLICATION OF CRANIAL NAVIGATION;  Surgeon: Consuella Lose, MD;  Location: [facility];  Service: Neurosurgery;  Laterality: N/A;  . COLONOSCOPY    . CRANIOTOMY Right 09/10/2013   Procedure: Craniotomy for tumor excision;  Surgeon: Consuella Lose, MD;  Location: [facility];  Service: Neurosurgery;  Laterality: Right;  Craniotomy for meningioma with stealth  . CRANIOTOMY N/A 04/16/2020   Procedure: STEREOTACTIC CRANIOTOMY FOR RESECTION OF MENINGIOMA;  Surgeon: Consuella Lose, MD;  Location: [facility];  Service: Neurosurgery;  Laterality: N/A;  . DILATION AND CURETTAGE OF UTERUS  1980  . left hand surgery  2007 and 2008   plates and screws   Social History:  Social History   Socioeconomic History  . Marital status: Married    Spouse name: Not on file  . Number of children: Not on file  .  Years of education: Not on file  . Highest education level: Not on file  Occupational History  . Not on file  Tobacco Use  . Smoking status: Former Research scientist (life sciences)  . Smokeless tobacco: Never Used  . Tobacco comment: quit 64yrs ago  Vaping Use  . Vaping Use: Never used  Substance and Sexual Activity  . Alcohol use: No  . Drug use: No  . Sexual activity: Not Currently    Birth control/protection: Surgical  Other Topics Concern  . Not on file  Social History Narrative  . Not on file   Social Determinants of  Health   Financial Resource Strain:   . Difficulty of Paying Living Expenses: Not on file  Food Insecurity:   . Worried About Charity fundraiser in the Last Year: Not on file  . Ran Out of Food in the Last Year: Not on file  Transportation Needs:   . Lack of Transportation (Medical): Not on file  . Lack of Transportation (Non-Medical): Not on file  Physical Activity:   . Days of Exercise per Week: Not on file  . Minutes of Exercise per Session: Not on file  Stress:   . Feeling of Stress : Not on file  Social Connections:   . Frequency of Communication with Friends and Family: Not on file  . Frequency of Social Gatherings with Friends and Family: Not on file  . Attends Religious Services: Not on file  . Active Member of Clubs or Organizations: Not on file  . Attends Archivist Meetings: Not on file  . Marital Status: Not on file  Intimate Partner Violence:   . Fear of Current or Ex-Partner: Not on file  . Emotionally Abused: Not on file  . Physically Abused: Not on file  . Sexually Abused: Not on file   Family History:  Family History  Problem Relation Age of Onset  . Cancer Father        Prostate  . Deep vein thrombosis Father     Review of Systems: Constitutional: Doesn't report fevers, chills or abnormal weight loss Eyes: Doesn't report blurriness of vision Ears, nose, mouth, throat, and face: Doesn't report sore throat Respiratory: Doesn't report cough, dyspnea or wheezes Cardiovascular: Doesn't report palpitation, chest discomfort  Gastrointestinal:  Doesn't report nausea, constipation, diarrhea GU: Doesn't report incontinence Skin: Doesn't report skin rashes Neurological: Per HPI Musculoskeletal: Doesn't report joint pain Behavioral/Psych: Doesn't report anxiety  Physical Exam: Vitals:   09/11/20 0908  BP: (!) 142/84  Pulse: 89  Resp: 18  Temp: 97.8 F (36.6 C)  SpO2: 100%   KPS: 70. General: Alert, cooperative, pleasant, in no acute  distress Head: Normal EENT: No conjunctival injection or scleral icterus.  Lungs: Resp effort normal Cardiac: Regular rate Abdomen: Non-distended abdomen Skin: No rashes cyanosis or petechiae. Extremities: No clubbing or edema  Neurologic Exam: Mental Status: Awake, alert, attentive to examiner. Oriented to self and environment. Language is fluent with intact comprehension.  Cranial Nerves: Visual acuity is grossly normal. Visual fields are full. Extra-ocular movements intact. No ptosis. Face is symmetric Motor: Tone and bulk are normal. Power 4/5 in left arm and leg. Reflexes are symmetric, no pathologic reflexes present.  Sensory: Intact to light touch Gait: Hemiparetic  Labs: I have reviewed the data as listed    Component Value Date/Time   NA 140 04/22/2020 0532   K 4.1 04/22/2020 0532   CL 105 04/22/2020 0532   CO2 26 04/22/2020 0532   GLUCOSE 104 (  H) 04/22/2020 0532   BUN 17 04/22/2020 0532   CREATININE 0.76 04/22/2020 0532   CALCIUM 9.0 04/22/2020 0532   PROT 5.9 (L) 04/22/2020 0532   ALBUMIN 3.0 (L) 04/22/2020 0532   AST 20 04/22/2020 0532   ALT 37 04/22/2020 0532   ALKPHOS 64 04/22/2020 0532   BILITOT 0.6 04/22/2020 0532   GFRNONAA >60 04/22/2020 0532   GFRAA >60 04/22/2020 0532   Lab Results  Component Value Date   WBC 16.0 (H) 04/22/2020   NEUTROABS 8.7 (H) 04/22/2020   HGB 11.8 (L) 04/22/2020   HCT 36.8 04/22/2020   MCV 93.9 04/22/2020   PLT 291 04/22/2020    Imaging:  [facility] Clinician Interpretation: I have personally reviewed the CNS images as listed.  My interpretation, in the context of the patient's clinical presentation, is stable disease  MR BRAIN W WO CONTRAST  Result Date: 08/14/2020 CLINICAL DATA:  Meningioma. Brain/CNS neoplasm, assess treatment response. Additional history provided by scanning technologist: Patient reports headaches and left sided weakness, difficulty walking, left-sided numbness, prior brain surgery and radiation. EXAM: MRI  HEAD WITHOUT AND WITH CONTRAST TECHNIQUE: Multiplanar, multiecho pulse sequences of the brain and surrounding structures were obtained without and with intravenous contrast. CONTRAST:  77mL MULTIHANCE GADOBENATE DIMEGLUMINE 529 MG/ML IV SOLN COMPARISON:  Prior brain MRI examinations 05/15/2020 and earlier. FINDINGS: Brain: Interval evolution of postoperative changes related to prior right-sided craniotomy/cranioplasty for meningioma resection. Residual subdural fluid collection deep to the cranioplasty now measuring 8 mm in thickness (previously 9 mm). As before, the fluid collection exerts mild mass effect upon the underlying right frontal lobe. No midline shift. A suspected residual meningioma at the falx and superior sagittal sinus is unchanged in size as compared to the MRI of 05/15/2020, measuring 2.2 x 1.2 x 0.9 cm (AP x TV x CC) (series 14, image 139)(remeasured on prior). An additional suspected residual meningioma more anteriorly at the falx and superior sagittal sinus has decreased in size, now measuring 1.0 x 0.5 x 0.2 cm (AP x TV x CC) (series 14, image 134) previously 1.0 x 0.7 x 1.1 cm). There has been progressive smooth, non masslike dural thickening along the adjacent falx (for instance as seen on series 13, image 17). Smooth, non mass dural thickening along the right frontal convexity appears similar. Findings are likely postoperative/treatment related. Unchanged encephalomalacia and cortical laminar necrosis as well as possible mild edema within the underlying right frontal lobe. Unchanged chronic hemosiderin deposition at this site. No evidence of acute infarct.  Background cerebral volume is normal. Vascular: Expected proximal arterial flow voids. Chronic superior sagittal sinus invasion. Skull and upper cervical spine: Sequela of prior right sided craniotomy/cranioplasty. Normal background bone marrow signal. Sinuses/Orbits: Visualized orbits show no acute finding. Mild ethmoid and left  maxillary sinus mucosal thickening. No significant mastoid effusion. IMPRESSION: Evolving postoperative changes from prior right craniotomy and meningioma resection. Two suspected residual tumor foci along the falx and invading the superior sagittal sinus are again demonstrated. The more posterior tumor focus is unchanged in size as compared to the MRI of 05/15/2020 (2.2 cm). The more anterior residual tumor focus has decreased in size (now 1.0 cm). Slight interval decrease in thickness of a subdural fluid collection deep to the cranioplasty, now 0.8 cm. Persistent mild mass effect upon the underlying right frontal lobe. Unchanged chronic encephalomalacia and possible mild edema within the underlying right frontal lobe. Electronically Signed   By: Kellie Simmering DO   On: 08/14/2020 15:59   Assessment/Plan Meningioma (  [place]) [D32.9]   [name] [name] is clinically and radiographically stable today.  MRI demonstrates overall stability of tumor burden and edema.  Seizures have been well controlled on Keppra.  Motor function affecting the left side persists despite aggressive rehabilitation efforts.  We ask that [name] [name] return to clinic in 6 months following next brain MRI, or sooner as needed.  All questions were answered. The patient knows to call the clinic with any problems, questions or concerns. No barriers to learning were detected.  The total time spent in the encounter was 30 minutes and more than 50% was on counseling and review of test results   Ventura Sellers, MD Medical Director of Neuro-Oncology [facility] at [place] 09/11/20 9:04 AM

## 2020-10-16 ENCOUNTER — Other Ambulatory Visit: Payer: Self-pay

## 2020-10-16 ENCOUNTER — Encounter: Payer: Self-pay | Admitting: Physical Medicine & Rehabilitation

## 2020-10-16 ENCOUNTER — Encounter: Payer: Self-pay | Attending: Registered Nurse | Admitting: Physical Medicine & Rehabilitation

## 2020-10-16 VITALS — BP 129/77 | HR 86 | Temp 98.6°F | Ht 63.0 in | Wt 169.6 lb

## 2020-10-16 DIAGNOSIS — G8194 Hemiplegia, unspecified affecting left nondominant side: Secondary | ICD-10-CM | POA: Insufficient documentation

## 2020-10-16 DIAGNOSIS — D329 Benign neoplasm of meninges, unspecified: Secondary | ICD-10-CM | POA: Insufficient documentation

## 2020-10-16 MED ORDER — GABAPENTIN 100 MG PO CAPS: 100.0000 mg | ORAL_CAPSULE | Freq: Two times a day (BID) | ORAL | 1 refills | Status: DC

## 2020-10-16 NOTE — Progress Notes (Signed)
Subjective:    Patient ID: [name] [name], female    DOB: Jul 05, 1956, 64 y.o.   MRN: [id number] right-handed femalewith history of hyperlipidemia, hypertension, tobacco abuse, craniotomy for tumor excision meningioma 2014. 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 Presented 04/12/2020 with left-sided weakness and seizure. Patient on no antiepileptic medication prior to admission. Admission chemistries unremarkable. EEG negative for seizure. MRI showed recurrent meningioma along the right frontoparietal convexity and falx with some contralateral extension. Compression and possible invasion of the adjacent superior sagittal sinus which remain patent. Patient underwent stereotactic right frontal craniotomy resection of meningioma 04/16/2020 per Dr. Kathyrn Sheriff. Maintained on Keppra for seizure prophylaxis as well as Decadron protocol. Subcutaneous heparin initiated for DVT prophylaxis 04/19/2020.  HPI Chief complaint is left shoulder pain 64 year old female with left hemiparesis and spasticity who completed her inpatient rehabilitation at [facility] following craniotomy for resection of recurrent meningioma.  Last visit in this office was 07/17/2020.  Patient has recently seen her neuro oncologist, Dr. Mickeal Skinner and complained about left shoulder pain.  Patient was told that it may be a frozen shoulder. The patient denies any trauma to the left shoulder no recurrent falls. The patient does have numbness and tingling and pain in the hand and wrist area on the left side but this seems different than the shoulder pain which is mainly activity related. The patient has had some problem with spasms in the arm but generally this has not been severe. The patient has completed outpatient PT and OT  Pain Inventory Average Pain 0 Pain Right Now 0 My pain is intermittent and hurts to lift arms, tingling & numbness in finger tips  LOCATION OF PAIN  Shoulder, hand fingers BOWEL Number  of stools per week: 7 Oral laxative use No  Type of laxative NoEnema or suppository use No  History of colostomy No  Incontinent No   BLADDER Normal In and out cath, frequency - N/A Able to self cath No  Bladder incontinence No  Frequent urination No  Leakage with coughing No  Difficulty starting stream No  Incomplete bladder emptying No    Mobility use a cane how many minutes can you walk? 15 mins ability to climb steps?  yes do you drive?  no Do you have any goals in this area?  yes  Function employed # of hrs/week Not working as of 09/14/2020 I need assistance with the following:  bathing, meal prep and household duties Do you have any goals in this area?  yes  Neuro/Psych weakness numbness tingling anxiety  Prior Studies Any changes since last visit?  no  Physicians involved in your care Any changes since last visit?  no   Family History  Problem Relation Age of Onset  . Cancer Father        Prostate  . Deep vein thrombosis Father    Social History   Socioeconomic History  . Marital status: Married    Spouse name: Not on file  . Number of children: Not on file  . Years of education: Not on file  . Highest education level: Not on file  Occupational History  . Not on file  Tobacco Use  . Smoking status: Former Research scientist (life sciences)  . Smokeless tobacco: Never Used  . Tobacco comment: quit 82yrs ago  Vaping Use  . Vaping Use: Never used  Substance and Sexual Activity  . Alcohol use: No  . Drug use: No  . Sexual activity: Not Currently    Birth  control/protection: Surgical  Other Topics Concern  . Not on file  Social History Narrative  . Not on file   Social Determinants of Health   Financial Resource Strain:   . Difficulty of Paying Living Expenses: Not on file  Food Insecurity:   . Worried About Charity fundraiser in the Last Year: Not on file  . Ran Out of Food in the Last Year: Not on file  Transportation Needs:   . Lack of Transportation  (Medical): Not on file  . Lack of Transportation (Non-Medical): Not on file  Physical Activity:   . Days of Exercise per Week: Not on file  . Minutes of Exercise per Session: Not on file  Stress:   . Feeling of Stress : Not on file  Social Connections:   . Frequency of Communication with Friends and Family: Not on file  . Frequency of Social Gatherings with Friends and Family: Not on file  . Attends Religious Services: Not on file  . Active Member of Clubs or Organizations: Not on file  . Attends Archivist Meetings: Not on file  . Marital Status: Not on file   Past Surgical History:  Procedure Laterality Date  . ABDOMINAL HYSTERECTOMY  2002  . APPLICATION OF CRANIAL NAVIGATION N/A 04/16/2020   Procedure: APPLICATION OF CRANIAL NAVIGATION;  Surgeon: Consuella Lose, MD;  Location: [facility];  Service: Neurosurgery;  Laterality: N/A;  . COLONOSCOPY    . CRANIOTOMY Right 09/10/2013   Procedure: Craniotomy for tumor excision;  Surgeon: Consuella Lose, MD;  Location: [facility];  Service: Neurosurgery;  Laterality: Right;  Craniotomy for meningioma with stealth  . CRANIOTOMY N/A 04/16/2020   Procedure: STEREOTACTIC CRANIOTOMY FOR RESECTION OF MENINGIOMA;  Surgeon: Consuella Lose, MD;  Location: [facility];  Service: Neurosurgery;  Laterality: N/A;  . DILATION AND CURETTAGE OF UTERUS  1980  . left hand surgery  2007 and 2008   plates and screws   Past Medical History:  Diagnosis Date  . Bruises easily   . Carotid artery occlusion   . Dizziness   . Headache(784.0)   . Hyperlipidemia    takes Atorvastatin daily  . PONV (postoperative nausea and vomiting)   . Stroke ([place])   . TIA (transient ischemic attack)    BP 129/77   Pulse 86   Temp 98.6 F (37 C)   Ht 5\' 3"  (1.6 m)   Wt 169 lb 9.6 oz (76.9 kg)   SpO2 95%   BMI 30.04 kg/m   Opioid Risk Score:   Fall Risk Score:  `1  Depression screen PHQ 2/9  Depression screen [facility] 2/9 07/17/2020 06/05/2020  Decreased  Interest 0 0  Down, Depressed, Hopeless 0 0  PHQ - 2 Score 0 0  Altered sleeping - 0  Tired, decreased energy - 0  Change in appetite - 0  Feeling bad or failure about yourself  - 0  Trouble concentrating - 0  Moving slowly or fidgety/restless - 0  Suicidal thoughts - 0  PHQ-9 Score - 0   Review of Systems  Musculoskeletal: Positive for gait problem.  Neurological: Positive for weakness, numbness and headaches.  All other systems reviewed and are negative.      Objective:   Physical Exam Vitals and nursing note reviewed.  Constitutional:      Appearance: She is obese.  HENT:     Head: Normocephalic and atraumatic.  Eyes:     Extraocular Movements: Extraocular movements intact.  Conjunctiva/sclera: Conjunctivae normal.     Pupils: Pupils are equal, round, and reactive to light.  Musculoskeletal:     Right lower leg: Edema present.     Left lower leg: Edema present.     Comments: Minimal edema left dorsum of wrist no erythema no hypersensitivity to touch no pain with left wrist range of motion There is pain with shoulder abduction external rotation as well as adduction and forward flexion.  Pain occurs at end range  Skin:    General: Skin is warm and dry.  Neurological:     Mental Status: She is alert and oriented to person, place, and time.     Sensory: Sensory deficit present.     Motor: Tremor and abnormal muscle tone present.     Coordination: Coordination abnormal. Finger-Nose-Finger Test abnormal. Impaired rapid alternating movements.     Deep Tendon Reflexes:     Reflex Scores:      Tricep reflexes are 3+ on the left side.      Bicep reflexes are 3+ on the left side.      Brachioradialis reflexes are 3+ on the left side.    Comments: The patient can identify light touch in the left upper extremity however she indicates that she has strange sensations when her left hand and wrist are touched.  Positive dysdiadochokinesis with rapid alternating supination  pronation left upper extremity Mild dysmetria left finger-nose-finger mainly intention tremor.  Psychiatric:        Mood and Affect: Mood normal.        Behavior: Behavior normal.        Thought Content: Thought content normal.        Judgment: Judgment normal.           Assessment & Plan:   1.  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  We discussed neurogenic pain and medications used to help with it Will trial gabapentin 100mg  BID (pt states she is sensitive to meds)  2.  Adhesive capsulitis Left shoulder , discussed therapy , injections as options  Shoulder injection LEFT   Indication:Left Shoulder pain not relieved by medication management and other conservative care.  Informed consent was obtained after describing risks and benefits of the procedure with the patient, this includes bleeding, bruising, infection and medication side effects. The patient wishes to proceed and has given written consent. Patient was placed in a seated position. The left shoulder was marked and prepped with betadine in the subacromial area. A 25-gauge 1-1/2 inch needle was inserted into the subacromial area. After negative draw back for blood, a solution containing 1 mL of 6 mg per ML betamethasone and 4 mL of 1% lidocaine was injected. A band aid was applied. The patient tolerated the procedure well. Post procedure instructions were given.  If above injections not helpful will repeat under ultrasound guidance

## 2020-10-16 NOTE — Patient Instructions (Signed)
Did cortisone injection to left shoulder today   Adhesive Capsulitis  Adhesive capsulitis, also called frozen shoulder, causes the shoulder to become stiff and painful to move. This condition happens when there is inflammation of the tendons and ligaments that surround the shoulder joint (shoulder capsule). What are the causes? This condition may be caused by:  An injury to your shoulder joint.  Straining your shoulder.  Not moving your shoulder for a period of time. This can happen if your arm was injured or in a sling.  Long-standing conditions, such as: ? Diabetes. ? Thyroid problems. ? Heart disease. ? Stroke. ? Rheumatoid arthritis. ? Lung disease. In some cases, the cause is not known. What increases the risk? You are more likely to develop this condition if you are:  A woman.  Older than 62 years of age. What are the signs or symptoms? Symptoms of this condition include:  Pain in your shoulder when you move your arm. There may also be pain when parts of your shoulder are touched. The pain may be worse at night or when you are resting.  A sore or aching shoulder.  The inability to move your shoulder normally.  Muscle spasms. How is this diagnosed? This condition is diagnosed with a physical exam and imaging tests, such as an X-ray or MRI. How is this treated? This condition may be treated with:  Treatment of the underlying cause or condition.  Medicine. Medicine may be given to relieve pain, inflammation, or muscle spasms.  Steroid injections into the shoulder joint.  Physical therapy. This involves performing exercises to get the shoulder moving again.  Acupuncture. This is a type of treatment that involves stimulating specific points on your body by inserting thin needles through your skin.  Shoulder manipulation. This is a procedure to move the shoulder into another position. It is done after you are given a medicine to make you fall asleep (general  anesthetic). The joint may also be injected with salt water at high pressure to break down scarring.  Surgery. This may be done in severe cases when other treatments have failed. Although most people recover completely from adhesive capsulitis, some may not regain full shoulder movement. Follow these instructions at home: Managing pain, stiffness, and swelling      If directed, put ice on the injured area: ? Put ice in a plastic bag. ? Place a towel between your skin and the bag. ? Leave the ice on for 20 minutes, 2-3 times per day.  If directed, apply heat to the affected area before you exercise. Use the heat source that your health care provider recommends, such as a moist heat pack or a heating pad. ? Place a towel between your skin and the heat source. ? Leave the heat on for 20-30 minutes. ? Remove the heat if your skin turns bright red. This is especially important if you are unable to feel pain, heat, or cold. You may have a greater risk of getting burned. General instructions  Take over-the-counter and prescription medicines only as told by your health care provider.  If you are being treated with physical therapy, follow instructions from your physical therapist.  Avoid exercises that put a lot of demand on your shoulder, such as throwing. These exercises can make pain worse.  Keep all follow-up visits as told by your health care provider. This is important. Contact a health care provider if:  You develop new symptoms.  Your symptoms get worse. Summary  Adhesive capsulitis,  also called frozen shoulder, causes the shoulder to become stiff and painful to move.  You are more likely to have this condition if you are a woman and over age 52.  It is treated with physical therapy, medicines, and sometimes surgery. This information is not intended to replace advice given to you by your health care provider. Make sure you discuss any questions you have with your health care  provider. Document Revised: 04/06/2018 Document Reviewed: 04/06/2018 Elsevier Patient Education  [place].

## 2020-11-26 ENCOUNTER — Other Ambulatory Visit: Payer: Self-pay | Admitting: Neurosurgery

## 2020-11-26 DIAGNOSIS — D329 Benign neoplasm of meninges, unspecified: Secondary | ICD-10-CM

## 2020-11-27 ENCOUNTER — Encounter: Payer: BC Managed Care – PPO | Attending: Registered Nurse | Admitting: Physical Medicine & Rehabilitation

## 2020-11-27 ENCOUNTER — Other Ambulatory Visit: Payer: Self-pay

## 2020-11-27 ENCOUNTER — Encounter: Payer: Self-pay | Admitting: Physical Medicine & Rehabilitation

## 2020-11-27 VITALS — BP 130/84 | HR 98 | Temp 98.4°F | Ht 63.0 in | Wt 166.2 lb

## 2020-11-27 DIAGNOSIS — M7502 Adhesive capsulitis of left shoulder: Secondary | ICD-10-CM | POA: Diagnosis not present

## 2020-11-27 DIAGNOSIS — G8194 Hemiplegia, unspecified affecting left nondominant side: Secondary | ICD-10-CM | POA: Diagnosis not present

## 2020-11-27 NOTE — Progress Notes (Signed)
Shoulder injection Left glenohumeral  ultrasound guidance)  Indication:Left Shoulder pain not relieved by medication management and other conservative care. Moderately severe Pain with Left shoulder ext rotation, flexion and  abduction  Informed consent was obtained after describing risks and benefits of the procedure with the patient, this includes bleeding, bruising, infection and medication side effects. The patient wishes to proceed and has given written consent. Patient was placed in a seated position. TheLeftshoulder was marked and prepped with betadine in the subacromial area. A 25-gauge 1-1/2 inch needle was inserted into the subacromial area. After negative draw back for blood, a solution containing 1 mL of 6 mg per ML betamethasone and 4 mL of 1% lidocaine was injected. A band aid was applied. The patient tolerated the procedure well. Post procedure instructions were given.  Pt with adhesive capsulitis will order OT re eval RTC 1 mo, possible reinjection

## 2020-12-04 ENCOUNTER — Encounter: Payer: Self-pay | Admitting: Physical Medicine & Rehabilitation

## 2020-12-04 ENCOUNTER — Telehealth: Payer: Self-pay | Admitting: *Deleted

## 2020-12-04 DIAGNOSIS — M7500 Adhesive capsulitis of unspecified shoulder: Secondary | ICD-10-CM | POA: Insufficient documentation

## 2020-12-04 DIAGNOSIS — G8194 Hemiplegia, unspecified affecting left nondominant side: Secondary | ICD-10-CM

## 2020-12-04 DIAGNOSIS — M7502 Adhesive capsulitis of left shoulder: Secondary | ICD-10-CM

## 2020-12-04 NOTE — Telephone Encounter (Signed)
Received a call from [facility] about the OT referrral.  She is requesting we put it in under PT referral because their PT therapist can manage the request.  I have reordered as PT and faxed to [facility] @ [phone].

## 2020-12-18 ENCOUNTER — Telehealth: Payer: Self-pay | Admitting: Internal Medicine

## 2020-12-18 NOTE — Telephone Encounter (Signed)
Rescheduled 04/29 appointment to 04/28 due to provider pal, patient has been called and notified.

## 2020-12-25 ENCOUNTER — Ambulatory Visit
Admission: RE | Admit: 2020-12-25 | Discharge: 2020-12-25 | Disposition: A | Discharge status: Home / Self Care | Payer: BC Managed Care – PPO | Source: Ambulatory Visit | Attending: Neurosurgery | Admitting: Neurosurgery

## 2020-12-25 ENCOUNTER — Other Ambulatory Visit: Payer: Self-pay

## 2020-12-25 DIAGNOSIS — D329 Benign neoplasm of meninges, unspecified: Secondary | ICD-10-CM

## 2020-12-25 MED ORDER — GADOBENATE DIMEGLUMINE 529 MG/ML IV SOLN
15.0000 mL | Freq: Once | INTRAVENOUS | Status: AC | PRN
  Administered 2020-12-25: 15 mL via INTRAVENOUS

## 2021-01-01 ENCOUNTER — Encounter: Payer: Self-pay | Admitting: Physical Medicine & Rehabilitation

## 2021-01-01 ENCOUNTER — Encounter: Payer: BC Managed Care – PPO | Attending: Registered Nurse | Admitting: Physical Medicine & Rehabilitation

## 2021-01-01 ENCOUNTER — Other Ambulatory Visit: Payer: Self-pay

## 2021-01-01 VITALS — BP 143/85 | HR 82 | Temp 98.7°F | Ht 63.0 in | Wt 165.4 lb

## 2021-01-01 DIAGNOSIS — D329 Benign neoplasm of meninges, unspecified: Secondary | ICD-10-CM | POA: Diagnosis not present

## 2021-01-01 DIAGNOSIS — M7502 Adhesive capsulitis of left shoulder: Secondary | ICD-10-CM | POA: Diagnosis not present

## 2021-01-01 DIAGNOSIS — G8194 Hemiplegia, unspecified affecting left nondominant side: Secondary | ICD-10-CM

## 2021-01-01 NOTE — Patient Instructions (Signed)
Please call if you have increased Left shoulder pain  Please ask Dr Mickeal Skinner to clear you for driving since you are on seizure medicine

## 2021-01-01 NOTE — Progress Notes (Signed)
Subjective:    Patient ID: [name] [name], female    DOB: 07-27-1956, 65 y.o.   MRN: [id number] right-handed femalewith history of hyperlipidemia, hypertension, tobacco abuse, craniotomy for tumor excision meningioma 2014. 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 Presented 04/12/2020 with left-sided weakness and seizure. Patient on no antiepileptic medication prior to admission. Admission chemistries unremarkable. EEG negative for seizure. MRI showed recurrent meningioma along the right frontoparietal convexity and falx with some contralateral extension. Compression and possible invasion of the adjacent superior sagittal sinus which remain patent. Patient underwent stereotactic right frontal craniotomy resection of meningioma 04/16/2020 per Dr. Kathyrn Sheriff. Maintained on Keppra for seizure prophylaxis as well as Decadron protocol. Subcutaneous heparin initiated for DVT prophylaxis 04/19/2020.  HPI Patient scheduled for left shoulder injection however after going through 6 physical therapy visits after a left shoulder ultrasound-guided glenohumeral injection on 11/27/2020 patient's shoulder pain has improved. The patient continues to have tingling in her left hand.  This is sometimes painful.  We discussed continuing the gabapentin as long as this pain continues. In addition the patient would like to resume driving.  She has been on seizure prophylaxis.  We discussed that she would need to see her neurologist, Dr. Mickeal Skinner who is a neuro oncologist Pain Inventory Average Pain 5 Pain Right Now 0 My pain is intermittent, tingling and numbness in left hand  LOCATION OF PAIN Left hand  BOWEL Number of stools per week: 14 Oral laxative use No  Type of laxative oral when needed Enema or suppository use No  History of colostomy No  Incontinent No   BLADDER Normal In and out cath, frequency N/A Able to self cath No  Bladder incontinence No  Frequent urination No   Leakage with coughing No  Difficulty starting stream No  Incomplete bladder emptying No    Mobility use a cane how many minutes can you walk? 10-15 MINS ability to climb steps?  yes do you drive?  no Do you have any goals in this area?  yes  Function disabled: date disabled 12/11/2020 I need assistance with the following:  dressing, bathing, household duties and shopping Do you have any goals in this area?  yes  Neuro/Psych numbness tremor tingling trouble walking anxiety  Prior Studies Any changes since last visit?  no CT/MRI MRI at [place] 12/2020  Physicians involved in your care Any changes since last visit?  no   Family History  Problem Relation Age of Onset  . Cancer Father        Prostate  . Deep vein thrombosis Father    Social History   Socioeconomic History  . Marital status: Married    Spouse name: Not on file  . Number of children: Not on file  . Years of education: Not on file  . Highest education level: Not on file  Occupational History  . Not on file  Tobacco Use  . Smoking status: Former Research scientist (life sciences)  . Smokeless tobacco: Never Used  . Tobacco comment: quit 16yrs ago  Vaping Use  . Vaping Use: Never used  Substance and Sexual Activity  . Alcohol use: No  . Drug use: No  . Sexual activity: Not Currently    Birth control/protection: Surgical  Other Topics Concern  . Not on file  Social History Narrative  . Not on file   Social Determinants of Health   Financial Resource Strain: Not on file  Food Insecurity: Not on file  Transportation Needs: Not on file  Physical Activity: Not on file  Stress: Not on file  Social Connections: Not on file   Past Surgical History:  Procedure Laterality Date  . ABDOMINAL HYSTERECTOMY  2002  . APPLICATION OF CRANIAL NAVIGATION N/A 04/16/2020   Procedure: APPLICATION OF CRANIAL NAVIGATION;  Surgeon: Consuella Lose, MD;  Location: [place];  Service: Neurosurgery;  Laterality: N/A;  .  COLONOSCOPY    . CRANIOTOMY Right 09/10/2013   Procedure: Craniotomy for tumor excision;  Surgeon: Consuella Lose, MD;  Location: [place] [facility];  Service: Neurosurgery;  Laterality: Right;  Craniotomy for meningioma with stealth  . CRANIOTOMY N/A 04/16/2020   Procedure: STEREOTACTIC CRANIOTOMY FOR RESECTION OF MENINGIOMA;  Surgeon: Consuella Lose, MD;  Location: [place];  Service: Neurosurgery;  Laterality: N/A;  . DILATION AND CURETTAGE OF UTERUS  1980  . left hand surgery  2007 and 2008   plates and screws   Past Medical History:  Diagnosis Date  . Bruises easily   . Carotid artery occlusion   . Dizziness   . Headache(784.0)   . Hyperlipidemia    takes Atorvastatin daily  . PONV (postoperative nausea and vomiting)   . Stroke ([place])   . TIA (transient ischemic attack)    BP (!) 143/85   Pulse 82   Temp 98.7 F (37.1 C)   Ht 5\' 3"  (1.6 m)   Wt 165 lb 6.4 oz (75 kg)   SpO2 94%   BMI 29.30 kg/m   Opioid Risk Score:   Fall Risk Score:  `1  Depression screen PHQ 2/9  Depression screen [facility] 2/9 11/27/2020 10/16/2020 07/17/2020 06/05/2020  Decreased Interest 0 1 0 0  Down, Depressed, Hopeless 0 1 0 0  PHQ - 2 Score 0 2 0 0  Altered sleeping - - - 0  Tired, decreased energy - - - 0  Change in appetite - - - 0  Feeling bad or failure about yourself  - - - 0  Trouble concentrating - - - 0  Moving slowly or fidgety/restless - - - 0  Suicidal thoughts - - - 0  PHQ-9 Score - - - 0   Review of Systems  Constitutional: Negative.   Musculoskeletal:       Left hand pain  Neurological: Positive for tremors, weakness and numbness.       Left hand       Objective:   Physical Exam Vitals and nursing note reviewed.  Constitutional:      Appearance: She is obese.  HENT:     Head: Normocephalic and atraumatic.  Eyes:     Extraocular Movements: Extraocular movements intact.     Conjunctiva/sclera: Conjunctivae normal.     Pupils: Pupils are equal, round, and reactive to light.   Skin:    General: Skin is warm and dry.  Neurological:     General: No focal deficit present.     Mental Status: She is alert and oriented to person, place, and time.  Psychiatric:        Mood and Affect: Mood normal.        Behavior: Behavior normal.   Motor strength is 4/5 in the left deltoid, bicep, tricep, grip, hip flexor, knee extensor 3 - left ankle dorsiflexor 5/5 in the right deltoid bicep tricep grip hip flexor knee extensor ankle dorsiflexor and plantar flexor Sensation reduced in left hand to light touch.  Also reduced to temperature. Tone MAS 1 in the finger flexors elbow flexors and left upper extremity no increased tone  in the pectoralis   Left shoulder negative impingement sign no pain with external rotation the patient does have range of motion reduced by approximately 25% with external and internal rotation however is able to reach behind the head as well as behind her back.  Able to elevate her arm overhead without difficulty or pain     Assessment & Plan:  #1.  History of recurrent meningioma with left spastic hemiparesis.  Patient plateauing in terms of her functional status.  She does have sensory deficits left upper extremity must be careful in terms of being around hot objects.  We discussed that her sensory deficits are likely to persist given that its been almost 9 months since her surgery 2.  Left shoulder adhesive capsulitis improved after glenohumeral injection under ultrasound guidance plus physical therapy.  I will see her back in 3 months and have encouraged her to continue her home exercise program on a daily basis.  If she has a recurrence of severe shoulder pain she can call sooner and we can repeat the glenohumeral injection.  #3.  Patient wishes to return to driving, she is on antiseizure medications, I asked her to follow-up with Dr. Mickeal Skinner on this

## 2021-01-07 DIAGNOSIS — R030 Elevated blood-pressure reading, without diagnosis of hypertension: Secondary | ICD-10-CM | POA: Insufficient documentation

## 2021-01-18 ENCOUNTER — Other Ambulatory Visit: Payer: Self-pay | Admitting: *Deleted

## 2021-01-18 MED ORDER — GABAPENTIN 100 MG PO CAPS: 100.0000 mg | ORAL_CAPSULE | Freq: Two times a day (BID) | ORAL | 1 refills | Status: DC

## 2021-02-01 DIAGNOSIS — G5602 Carpal tunnel syndrome, left upper limb: Secondary | ICD-10-CM | POA: Insufficient documentation

## 2021-02-08 ENCOUNTER — Other Ambulatory Visit: Payer: Self-pay

## 2021-02-08 DIAGNOSIS — R269 Unspecified abnormalities of gait and mobility: Secondary | ICD-10-CM | POA: Insufficient documentation

## 2021-02-08 DIAGNOSIS — I6529 Occlusion and stenosis of unspecified carotid artery: Secondary | ICD-10-CM

## 2021-02-16 ENCOUNTER — Other Ambulatory Visit: Payer: Self-pay

## 2021-02-16 ENCOUNTER — Ambulatory Visit (HOSPITAL_COMMUNITY)
Admission: RE | Admit: 2021-02-16 | Discharge: 2021-02-16 | Disposition: A | Discharge status: Home / Self Care | Payer: BC Managed Care – PPO | Source: Ambulatory Visit | Attending: Vascular Surgery | Admitting: Vascular Surgery

## 2021-02-16 ENCOUNTER — Ambulatory Visit: Payer: BC Managed Care – PPO | Admitting: Physician Assistant

## 2021-02-16 ENCOUNTER — Encounter: Payer: Self-pay | Admitting: Physician Assistant

## 2021-02-16 VITALS — BP 130/79 | HR 72 | Temp 98.2°F | Resp 20 | Ht 63.0 in | Wt 161.4 lb

## 2021-02-16 DIAGNOSIS — I6529 Occlusion and stenosis of unspecified carotid artery: Secondary | ICD-10-CM | POA: Insufficient documentation

## 2021-02-16 DIAGNOSIS — I773 Arterial fibromuscular dysplasia: Secondary | ICD-10-CM

## 2021-02-16 NOTE — Progress Notes (Signed)
Office Note     CC:  follow up Requesting Provider:  Curlene Labrum, MD  HPI: [name] [name] is a 65 y.o. (1956/01/31) female who presents for surveillance of carotid artery stenosis with known fibromuscular dysplasia bilaterally.  Surgical history significant for right frontal craniotomy by Dr. Kathyrn Sheriff for resection of meningioma in October 2014.  Most recently she underwent another craniotomy for resection of multiple tumors in June 2021.  Patient states that postoperatively she had flaccid weakness of left arm and leg.  Through rehabilitation including CIR as well as outpatient PT and OT she is now able to walk with the use of a cane and has function in her left arm and hand.  She believes left-sided weakness continues to improve.  She denies any further one-sided weakness, slurring speech, or changes in vision which would be concerning for new TIA or CVA.  She is on aspirin and statin daily.  She denies tobacco use.   Past Medical History:  Diagnosis Date  . Bruises easily   . Carotid artery occlusion   . Dizziness   . Headache(784.0)   . Hyperlipidemia    takes Atorvastatin daily  . PONV (postoperative nausea and vomiting)   . Stroke ([place])   . TIA (transient ischemic attack)     Past Surgical History:  Procedure Laterality Date  . ABDOMINAL HYSTERECTOMY  2002  . APPLICATION OF CRANIAL NAVIGATION N/A 04/16/2020   Procedure: APPLICATION OF CRANIAL NAVIGATION;  Surgeon: Consuella Lose, MD;  Location: [place];  Service: Neurosurgery;  Laterality: N/A;  . COLONOSCOPY    . CRANIOTOMY Right 09/10/2013   Procedure: Craniotomy for tumor excision;  Surgeon: Consuella Lose, MD;  Location: [place] [facility];  Service: Neurosurgery;  Laterality: Right;  Craniotomy for meningioma with stealth  . CRANIOTOMY N/A 04/16/2020   Procedure: STEREOTACTIC CRANIOTOMY FOR RESECTION OF MENINGIOMA;  Surgeon: Consuella Lose, MD;  Location: [place];  Service: Neurosurgery;  Laterality: N/A;  . DILATION  AND CURETTAGE OF UTERUS  1980  . left hand surgery  2007 and 2008   plates and screws    Social History   Socioeconomic History  . Marital status: Married    Spouse name: Not on file  . Number of children: Not on file  . Years of education: Not on file  . Highest education level: Not on file  Occupational History  . Not on file  Tobacco Use  . Smoking status: Former Research scientist (life sciences)  . Smokeless tobacco: Never Used  . Tobacco comment: quit 67yrs ago  Vaping Use  . Vaping Use: Never used  Substance and Sexual Activity  . Alcohol use: No  . Drug use: No  . Sexual activity: Not Currently    Birth control/protection: Surgical  Other Topics Concern  . Not on file  Social History Narrative  . Not on file   Social Determinants of Health   Financial Resource Strain: Not on file  Food Insecurity: Not on file  Transportation Needs: Not on file  Physical Activity: Not on file  Stress: Not on file  Social Connections: Not on file  Intimate Partner Violence: Not on file    Family History  Problem Relation Age of Onset  . Cancer Father        Prostate  . Deep vein thrombosis Father     Current Outpatient Medications  Medication Sig Dispense Refill  . acetaminophen (TYLENOL) 325 MG tablet Take 650 mg by mouth every 6 (six) hours as needed for moderate pain or  headache.    Marland Kitchen aspirin 81 MG chewable tablet Chew 1 tablet (81 mg total) by mouth daily.    Marland Kitchen atorvastatin (LIPITOR) 40 MG tablet Take 1 tablet (40 mg total) by mouth daily. 30 tablet 0  . Cholecalciferol (VITAMIN D3) 50 MCG (2000 UT) TABS Take 1 tablet by mouth daily. 30 tablet 0  . gabapentin (NEURONTIN) 100 MG capsule Take 1 capsule (100 mg total) by mouth 2 (two) times daily. 60 capsule 1  . levETIRAcetam (KEPPRA) 750 MG tablet Take 2 tablets (1,500 mg total) by mouth 2 (two) times daily. 180 tablet 0  . metoprolol succinate (TOPROL-XL) 25 MG 24 hr tablet Take 0.5 tablets (12.5 mg total) by mouth daily. 30 tablet 0  . OVER  THE COUNTER MEDICATION Take 1 tablet by mouth daily. Vit B Stress Tablet    . pantoprazole (PROTONIX) 40 MG tablet Take 1 tablet by mouth daily.    . potassium chloride SA (K-DUR,KLOR-CON) 20 MEQ tablet Take 20 mEq by mouth every other day.    . senna (SENOKOT) 8.6 MG TABS tablet Take 1 tablet (8.6 mg total) by mouth at bedtime. 120 tablet 0   No current facility-administered medications for this visit.    Allergies  Allergen Reactions  . Adhesive [Tape]     Rash popped up after using bandaids, telemetry stickers.     REVIEW OF SYSTEMS:   [X]  denotes positive finding, [ ]  denotes negative finding Cardiac  Comments:  Chest pain or chest pressure:    Shortness of breath upon exertion:    Short of breath when lying flat:    Irregular heart rhythm:        Vascular    Pain in calf, thigh, or hip brought on by ambulation:    Pain in feet at night that wakes you up from your sleep:     Blood clot in your veins:    Leg swelling:         Pulmonary    Oxygen at home:    Productive cough:     Wheezing:         Neurologic    Sudden weakness in arms or legs:     Sudden numbness in arms or legs:     Sudden onset of difficulty speaking or slurred speech:    Temporary loss of vision in one eye:     Problems with dizziness:         Gastrointestinal    Blood in stool:     Vomited blood:         Genitourinary    Burning when urinating:     Blood in urine:        Psychiatric    Major depression:         Hematologic    Bleeding problems:    Problems with blood clotting too easily:        Skin    Rashes or ulcers:        Constitutional    Fever or chills:      PHYSICAL EXAMINATION:  Vitals:   02/16/21 1442  BP: 130/79  Pulse: 72  Resp: 20  Temp: 98.2 F (36.8 C)  TempSrc: Temporal  SpO2: 95%  Weight: 161 lb 6.4 oz (73.2 kg)  Height: 5\' 3"  (1.6 m)    General:  WDWN in NAD; vital signs documented above Gait: Not observed HENT: WNL, normocephalic Pulmonary:  normal non-labored breathing Cardiac: regular HR Abdomen: soft, NT, no masses Skin:  without rashes Vascular Exam/Pulses: Symmetrical radial pulses Extremities: without ischemic changes, without Gangrene , without cellulitis; without open wounds;  Musculoskeletal: no muscle wasting or atrophy  Neurologic: A&O X 3; fine motor function deficit left upper extremity; abnormal gait with left foot drop Psychiatric:  The pt has Normal affect.   Non-Invasive Vascular Imaging:   Right ICA 40 to 59% stenosis Left mid ICA 60 to 79% stenosis increased from 40 to 59% stenosis    ASSESSMENT/PLAN:: 65 y.o. female here for routine surveillance of carotid artery stenosis with known fibromuscular dysplasia  -Patient does have left-sided weakness however believes this is related to her recent craniotomy in June of last year -Carotid duplex demonstrates unchanged 40 to 59% stenosis of right ICA; left mid ICA increased from 40-59 to 60-79% stenosis -Continue aspirin and statin daily -Given increase in stenosis of left ICA we will shorten interval between repeat carotid duplex; if carotid duplex in 6 months is stable we can return to annual surveillance   Dagoberto Ligas, PA-C [facility] MD:   Stanford Breed

## 2021-02-18 ENCOUNTER — Other Ambulatory Visit: Payer: Self-pay

## 2021-02-18 DIAGNOSIS — I6529 Occlusion and stenosis of unspecified carotid artery: Secondary | ICD-10-CM

## 2021-03-11 ENCOUNTER — Other Ambulatory Visit: Payer: Self-pay

## 2021-03-11 ENCOUNTER — Inpatient Hospital Stay: Payer: BC Managed Care – PPO | Attending: Internal Medicine | Admitting: Internal Medicine

## 2021-03-11 VITALS — BP 140/85 | HR 74 | Temp 97.6°F | Resp 16 | Ht 63.0 in | Wt 160.0 lb

## 2021-03-11 DIAGNOSIS — Z87891 Personal history of nicotine dependence: Secondary | ICD-10-CM | POA: Insufficient documentation

## 2021-03-11 DIAGNOSIS — Z8042 Family history of malignant neoplasm of prostate: Secondary | ICD-10-CM | POA: Diagnosis not present

## 2021-03-11 DIAGNOSIS — R569 Unspecified convulsions: Secondary | ICD-10-CM | POA: Insufficient documentation

## 2021-03-11 DIAGNOSIS — M778 Other enthesopathies, not elsewhere classified: Secondary | ICD-10-CM | POA: Insufficient documentation

## 2021-03-11 DIAGNOSIS — Z79899 Other long term (current) drug therapy: Secondary | ICD-10-CM | POA: Insufficient documentation

## 2021-03-11 DIAGNOSIS — D329 Benign neoplasm of meninges, unspecified: Secondary | ICD-10-CM | POA: Insufficient documentation

## 2021-03-11 DIAGNOSIS — Z888 Allergy status to other drugs, medicaments and biological substances status: Secondary | ICD-10-CM | POA: Diagnosis not present

## 2021-03-11 DIAGNOSIS — G9389 Other specified disorders of brain: Secondary | ICD-10-CM | POA: Diagnosis not present

## 2021-03-11 DIAGNOSIS — Z8249 Family history of ischemic heart disease and other diseases of the circulatory system: Secondary | ICD-10-CM | POA: Diagnosis not present

## 2021-03-11 DIAGNOSIS — R200 Anesthesia of skin: Secondary | ICD-10-CM | POA: Diagnosis not present

## 2021-03-11 DIAGNOSIS — M25539 Pain in unspecified wrist: Secondary | ICD-10-CM | POA: Diagnosis not present

## 2021-03-11 NOTE — Progress Notes (Signed)
[name] [name] at [place], [place] [address] [phone]   Interval Evaluation  Date of Service: 03/11/21 Patient Name: [name] [name] Patient MRN: [id number] Patient DOB: 1956-06-17 Provider: Ventura Sellers, MD  Identifying Statement:  [name] [name] is a 65 y.o. female with multifocal meningioma  Oncologic History: 09/10/13: Craniotomy, resection with Dr. Kathyrn Sheriff. 04/21/20: R frontal and falcine recurrence, repeat resection debulkeing with Nundkumar 05/22/20: SRS to residual tumor along falx and sinus with Dr. Tammi Klippel  Interval History:  [name] [name] presents today for follow up.  Denies new or progressive deficits today.  No further seizure since the event in June.  Does complain of pain and numbness in her palm/wrist, worse at night.  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  Denies headaches.  Medications: Current Outpatient Medications on File Prior to Visit  Medication Sig Dispense Refill  . acetaminophen (TYLENOL) 325 MG tablet Take 650 mg by mouth every 6 (six) hours as needed for moderate pain or headache.    Marland Kitchen aspirin 81 MG chewable tablet Chew 1 tablet (81 mg total) by mouth daily.    Marland Kitchen atorvastatin (LIPITOR) 40 MG tablet Take 1 tablet (40 mg total) by mouth daily. 30 tablet 0  . Cholecalciferol (VITAMIN D3) 50 MCG (2000 UT) TABS Take 1 tablet by mouth daily. 30 tablet 0  . gabapentin (NEURONTIN) 100 MG capsule Take 1 capsule (100 mg total) by mouth 2 (two) times daily. 60 capsule 1  . levETIRAcetam (KEPPRA) 750 MG tablet Take 2 tablets (1,500 mg total) by mouth 2 (two) times daily. 180 tablet 0  . metoprolol succinate (TOPROL-XL) 25 MG 24 hr tablet Take 0.5 tablets (12.5 mg total) by mouth daily. 30 tablet 0  . OVER THE COUNTER MEDICATION Take 1 tablet by mouth daily. Vit B Stress Tablet    . pantoprazole (PROTONIX) 40 MG tablet Take 1 tablet by mouth daily.    . potassium chloride SA (K-DUR,KLOR-CON)  20 MEQ tablet Take 20 mEq by mouth every other day.    . senna (SENOKOT) 8.6 MG TABS tablet Take 1 tablet (8.6 mg total) by mouth at bedtime. (Patient not taking: Reported on 03/11/2021) 120 tablet 0   No current facility-administered medications on file prior to visit.    Allergies:  Allergies  Allergen Reactions  . Adhesive [Tape]     Rash popped up after using bandaids, telemetry stickers.   Past Medical History:  Past Medical History:  Diagnosis Date  . Bruises easily   . Carotid artery occlusion   . Dizziness   . Headache(784.0)   . Hyperlipidemia    takes Atorvastatin daily  . PONV (postoperative nausea and vomiting)   . Stroke ([place])   . TIA (transient ischemic attack)    Past Surgical History:  Past Surgical History:  Procedure Laterality Date  . ABDOMINAL HYSTERECTOMY  2002  . APPLICATION OF CRANIAL NAVIGATION N/A 04/16/2020   Procedure: APPLICATION OF CRANIAL NAVIGATION;  Surgeon: Consuella Lose, MD;  Location: [facility];  Service: Neurosurgery;  Laterality: N/A;  . COLONOSCOPY    . CRANIOTOMY Right 09/10/2013   Procedure: Craniotomy for tumor excision;  Surgeon: Consuella Lose, MD;  Location: [facility];  Service: Neurosurgery;  Laterality: Right;  Craniotomy for meningioma with stealth  . CRANIOTOMY N/A 04/16/2020   Procedure: STEREOTACTIC CRANIOTOMY FOR RESECTION OF MENINGIOMA;  Surgeon: Consuella Lose, MD;  Location: [facility];  Service: Neurosurgery;  Laterality: N/A;  . DILATION AND CURETTAGE OF  UTERUS  1980  . left hand surgery  2007 and 2008   plates and screws   Social History:  Social History   Socioeconomic History  . Marital status: Married    Spouse name: Not on file  . Number of children: Not on file  . Years of education: Not on file  . Highest education level: Not on file  Occupational History  . Not on file  Tobacco Use  . Smoking status: Former Research scientist (life sciences)  . Smokeless tobacco: Never Used  . Tobacco comment: quit 48yrs ago  Vaping Use  .  Vaping Use: Never used  Substance and Sexual Activity  . Alcohol use: No  . Drug use: No  . Sexual activity: Not Currently    Birth control/protection: Surgical  Other Topics Concern  . Not on file  Social History Narrative  . Not on file   Social Determinants of Health   Financial Resource Strain: Not on file  Food Insecurity: Not on file  Transportation Needs: Not on file  Physical Activity: Not on file  Stress: Not on file  Social Connections: Not on file  Intimate Partner Violence: Not on file   Family History:  Family History  Problem Relation Age of Onset  . Cancer Father        Prostate  . Deep vein thrombosis Father     Review of Systems: Constitutional: Doesn't report fevers, chills or abnormal weight loss Eyes: Doesn't report blurriness of vision Ears, nose, mouth, throat, and face: Doesn't report sore throat Respiratory: Doesn't report cough, dyspnea or wheezes Cardiovascular: Doesn't report palpitation, chest discomfort  Gastrointestinal:  Doesn't report nausea, constipation, diarrhea GU: Doesn't report incontinence Skin: Doesn't report skin rashes Neurological: Per HPI Musculoskeletal: Doesn't report joint pain Behavioral/Psych: Doesn't report anxiety  Physical Exam: Vitals:   03/11/21 1028  BP: 140/85  Pulse: 74  Resp: 16  Temp: 97.6 F (36.4 C)  SpO2: 100%   KPS: 70. General: Alert, cooperative, pleasant, in no acute distress Head: Normal EENT: No conjunctival injection or scleral icterus.  Lungs: Resp effort normal Cardiac: Regular rate Abdomen: Non-distended abdomen Skin: No rashes cyanosis or petechiae. Extremities: No clubbing or edema  Neurologic Exam: Mental Status: Awake, alert, attentive to examiner. Oriented to self and environment. Language is fluent with intact comprehension.  Cranial Nerves: Visual acuity is grossly normal. Visual fields are full. Extra-ocular movements intact. No ptosis. Face is symmetric Motor: Tone and  bulk are normal. Power 4/5 in left arm and leg. Reflexes are symmetric, no pathologic reflexes present.  Sensory: Intact to light touch Gait: Hemiparetic  Labs: I have reviewed the data as listed    Component Value Date/Time   NA 140 04/22/2020 0532   K 4.1 04/22/2020 0532   CL 105 04/22/2020 0532   CO2 26 04/22/2020 0532   GLUCOSE 104 (H) 04/22/2020 0532   BUN 17 04/22/2020 0532   CREATININE 0.76 04/22/2020 0532   CALCIUM 9.0 04/22/2020 0532   PROT 5.9 (L) 04/22/2020 0532   ALBUMIN 3.0 (L) 04/22/2020 0532   AST 20 04/22/2020 0532   ALT 37 04/22/2020 0532   ALKPHOS 64 04/22/2020 0532   BILITOT 0.6 04/22/2020 0532   GFRNONAA >60 04/22/2020 0532   GFRAA >60 04/22/2020 0532   Lab Results  Component Value Date   WBC 16.0 (H) 04/22/2020   NEUTROABS 8.7 (H) 04/22/2020   HGB 11.8 (L) 04/22/2020   HCT 36.8 04/22/2020   MCV 93.9 04/22/2020   PLT 291 04/22/2020  Imaging:  [facility] Clinician Interpretation: I have personally reviewed the CNS images as listed.  My interpretation, in the context of the patient's clinical presentation, is stable disease  CLINICAL DATA:  Benign neoplasm of meninges.  EXAM: MRI HEAD WITHOUT AND WITH CONTRAST  TECHNIQUE: Multiplanar, multiecho pulse sequences of the brain and surrounding structures were obtained without and with intravenous contrast.  CONTRAST:  97mL MULTIHANCE GADOBENATE DIMEGLUMINE 529 MG/ML IV SOLN  COMPARISON:  MRI of the brain August 14, 2020.  FINDINGS: Brain: Postsurgical changes from right craniotomy with unchanged area of encephalomalacia and gliosis with associated cortical laminar necrosis in the right frontal lobe.  Significant interval decrease in size of the epidural fluid collection subjacent to the area of craniotomy, now measuring up to 3 mm in thickness, compared to 8 mm on prior, now with minimal mass effect on the subjacent brain parenchyma. Persistent increased pachymeningeal thickness in contrast  enhancement along the right frontal convexity and anterior cerebral falx.  Residual tumor is again identified within the superior sagittal sinus (series 13, image 133) measuring approximately 22 x 13 x 6 mm compared to 22 x 12 x 9 mm on prior. The second focus of residual tumor described anteriorly at the falx is no longer identified.  No new focus of abnormal contrast enhancement. No acute infarct, intracranial hemorrhage or hydrocephalus.  Vascular: Normal flow voids.  Skull and upper cervical spine: Postsurgical changes from right frontal craniotomy. No focal marrow lesion identified.  Sinuses/Orbits: Negative.  IMPRESSION: 1. Stable postsurgical changes from right frontal craniotomy with unchanged area of encephalomalacia and gliosis with associated cortical laminar necrosis in the right frontal lobe. 2. Significant interval decrease in size of the epidural fluid collection subjacent to the area of craniotomy, now measuring up to 3 mm in thickness, compared to 8 mm on prior, now with minimal mass effect on the subjacent brain parenchyma. 3. Residual tumor within the superior sagittal sinus, not significantly changed in size. 4. The second focus of residual tumor described anteriorly at the falx is no longer identified.   Electronically Signed   By: Pedro Earls M.D.   On: 12/25/2020 12:33  Assessment/Plan Meningioma [facility])  Seizure ([place])   [name] [name] is clinically and radiographically stable today.  MRI demonstrates overall stability of tumor burden and edema.    We recommended decreasing Keppra to 750mg  BID due to mood effects, very good seizure control, seizure freedom of nearly 1 year.  Motor function affecting the left side persists despite aggressive rehabilitation efforts.  Palm and wrist symptoms may be secondary to carpal tunnel.  We recommended trial of nocturnal wrist splint.  She has EMG results pending.  We ask that [name] [name] return to clinic in 6 months following next brain MRI, or sooner as needed.  All questions were answered. The patient knows to call the clinic with any problems, questions or concerns. No barriers to learning were detected.  The total time spent in the encounter was 30 minutes and more than 50% was on counseling and review of test results   Ventura Sellers, MD Medical Director of Neuro-Oncology [facility] at [place] 03/11/21 10:32 AM

## 2021-03-12 ENCOUNTER — Ambulatory Visit: Payer: BC Managed Care – PPO | Admitting: Internal Medicine

## 2021-03-22 ENCOUNTER — Other Ambulatory Visit: Payer: Self-pay | Admitting: *Deleted

## 2021-03-22 DIAGNOSIS — D329 Benign neoplasm of meninges, unspecified: Secondary | ICD-10-CM

## 2021-03-26 ENCOUNTER — Encounter: Payer: Self-pay | Admitting: Physical Medicine & Rehabilitation

## 2021-03-26 ENCOUNTER — Encounter: Payer: BC Managed Care – PPO | Attending: Registered Nurse | Admitting: Physical Medicine & Rehabilitation

## 2021-03-26 ENCOUNTER — Other Ambulatory Visit: Payer: Self-pay

## 2021-03-26 VITALS — BP 147/80 | HR 92 | Temp 98.9°F | Ht 63.0 in | Wt 159.4 lb

## 2021-03-26 DIAGNOSIS — M7502 Adhesive capsulitis of left shoulder: Secondary | ICD-10-CM | POA: Diagnosis not present

## 2021-03-26 DIAGNOSIS — G8194 Hemiplegia, unspecified affecting left nondominant side: Secondary | ICD-10-CM | POA: Insufficient documentation

## 2021-03-26 NOTE — Progress Notes (Signed)
Subjective:     Patient ID: [name] [name], female   DOB: 1956-10-12, 65 y.o.   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  She has seen her neuro oncologist Dr. Mickeal Skinner who reduced dose of Keppra.  According to patient, Dr. Mickeal Skinner thought from a seizure standpoint patient can drive.  The patient still has left hemiparesis and does not feel like she would be very safe driving. She has had no falls or trauma on the left side. She has followed up with neurosurgery and was complaining of tingling and numbness in the left hand.  An EMG/NCV was ordered and there was mild carpal tunnel and it was unclear whether this was the cause of the hand tingling.  A left carpal tunnel injection is planned to see whether this improves patient's symptoms. Left shoulder pain recurrent , started after therapy completed on 01/25/2021 Had good relief from ultrasound guided Left shoulder injection performed on 11/27/2020 Pain Inventory Average Pain 8 Pain Right Now 7 My pain is constant, tingling and aching  LOCATION OF PAIN  Left arm, Left hand & Left shoulder  BOWEL Number of stools per week: 7 Oral laxative use No  Type of laxative Senna Enema or suppository use No  History of colostomy No  Incontinent No   BLADDER Normal In and out cath, frequency N/A Able to self cath No  Bladder incontinence No  Frequent urination No  Leakage with coughing No  Difficulty starting stream No  Incomplete bladder emptying No    Mobility use a cane how many minutes can you walk? 15-20 MINS ability to climb steps?  yes do you drive?  no Do you have any goals in this area?  yes  Function disabled: date disabled 11/2020  Neuro/Psych weakness numbness tingling trouble walking dizziness depression anxiety  Prior Studies Any changes since last visit?  yes CT/MRI nerve study Dr. Assunta Curtis  Physicians involved in  your care Any changes since last visit?  yes   Family History  Problem Relation Age of Onset  . Cancer Father        Prostate  . Deep vein thrombosis Father    Social History   Socioeconomic History  . Marital status: Married    Spouse name: Not on file  . Number of children: Not on file  . Years of education: Not on file  . Highest education level: Not on file  Occupational History  . Not on file  Tobacco Use  . Smoking status: Former Research scientist (life sciences)  . Smokeless tobacco: Never Used  . Tobacco comment: quit 52yrs ago  Vaping Use  . Vaping Use: Never used  Substance and Sexual Activity  . Alcohol use: No  . Drug use: No  . Sexual activity: Not Currently    Birth control/protection: Surgical  Other Topics Concern  . Not on file  Social History Narrative  . Not on file   Social Determinants of Health   Financial Resource Strain: Not on file  Food Insecurity: Not on file  Transportation Needs: Not on file  Physical Activity: Not on file  Stress: Not on file  Social Connections: Not on file   Past Surgical History:  Procedure Laterality Date  . ABDOMINAL HYSTERECTOMY  2002  . APPLICATION OF CRANIAL NAVIGATION N/A 04/16/2020   Procedure: APPLICATION OF CRANIAL NAVIGATION;  Surgeon: Consuella Lose, MD;  Location: [facility];  Service: Neurosurgery;  Laterality: N/A;  .  COLONOSCOPY    . CRANIOTOMY Right 09/10/2013   Procedure: Craniotomy for tumor excision;  Surgeon: Consuella Lose, MD;  Location: [facility];  Service: Neurosurgery;  Laterality: Right;  Craniotomy for meningioma with stealth  . CRANIOTOMY N/A 04/16/2020   Procedure: STEREOTACTIC CRANIOTOMY FOR RESECTION OF MENINGIOMA;  Surgeon: Consuella Lose, MD;  Location: [facility];  Service: Neurosurgery;  Laterality: N/A;  . DILATION AND CURETTAGE OF UTERUS  1980  . left hand surgery  2007 and 2008   plates and screws   Past Medical History:  Diagnosis Date  . Bruises easily   . Carotid artery occlusion   .  Dizziness   . Headache(784.0)   . Hyperlipidemia    takes Atorvastatin daily  . PONV (postoperative nausea and vomiting)   . Stroke ([place])   . TIA (transient ischemic attack)    BP (!) 147/80   Pulse 92   Temp 99.1 F (37.3 C)   Ht 5\' 3"  (1.6 m)   Wt 159 lb 6.4 oz (72.3 kg)   SpO2 99%   BMI 28.24 kg/m   Opioid Risk Score:   Fall Risk Score:  `1  Depression screen PHQ 2/9  Depression screen [facility] 2/9 01/01/2021 11/27/2020 10/16/2020 07/17/2020 06/05/2020  Decreased Interest 0 0 1 0 0  Down, Depressed, Hopeless 0 0 1 0 0  PHQ - 2 Score 0 0 2 0 0  Altered sleeping - - - - 0  Tired, decreased energy - - - - 0  Change in appetite - - - - 0  Feeling bad or failure about yourself  - - - - 0  Trouble concentrating - - - - 0  Moving slowly or fidgety/restless - - - - 0  Suicidal thoughts - - - - 0  PHQ-9 Score - - - - 0   Review of Systems  Musculoskeletal: Positive for gait problem.       Pain in left shoulder, left hand, left arm  All other systems reviewed and are negative.      Objective:   Physical Exam Vitals and nursing note reviewed.  HENT:     Head: Normocephalic and atraumatic.  Eyes:     Extraocular Movements: Extraocular movements intact.     Conjunctiva/sclera: Conjunctivae normal.     Pupils: Pupils are equal, round, and reactive to light.  Neurological:     Mental Status: She is oriented to person, place, and time.  Psychiatric:        Mood and Affect: Mood normal.        Behavior: Behavior normal.   Positive impingement testing left shoulder pain with external rotation left shoulder Left upper extremity strength 4 - at the biceps triceps and grip 3 - at the deltoid limited by range of motion Shoulder range of motion has approximately 10 degrees of external rotation 80 degrees of abduction and forward flexion. Full wrist and elbow range of motion full finger range of motion Left lower extremity 4 - at the hip flexor knee extensors ankle dorsiflexors No pain  with cervical spine range of motion Musculoskeletal: Tenderness palpation left upper trap left upper medial scapular border left infraspinatus region    Assessment:     History of recurrent right frontal meningioma, follow-up with neurosurgery as well as neuro-oncology Patient has left hemiparesis which is likely at her new baseline  Left hand tingling likely related to her meningioma although EMG testing did show mild carpal tunnel she plans to get a left carpal  tunnel injection at the neurosurgery office  Adhesive capsulitis left shoulder responded well to ultrasound-guided injection of the left shoulder performed several months ago with a 56-month duration may repeat today  Myofascial pain syndrome left periscapular area involving the trapezius levator scapula and infraspinatus.  We will have patient return in 1 month for trigger point injections  History of seizure disorder which has been well controlled on Keppra has been seizure-free for 1 year managed by neurology    Plan:     Shoulder injectionLeft With ultrasound guidance)  Indication: Left shoulder pain not relieved by medication management and other conservative care.  Informed consent was obtained after describing risks and benefits of the procedure with the patient, this includes bleeding, bruising, infection and medication side effects. The patient wishes to proceed and has given written consent. Patient was placed in a seated position. The left shoulder was marked and prepped with betadine in the subacromial area. A 25-gauge 1-1/2 inch needle was inserted into the subacromial area. After negative draw back for blood, a solution containing 1 mL of 6 mg per ML betamethasone and 4 mL of 1% lidocaine was injected. A band aid was applied. The patient tolerated the procedure well. Post procedure instructions were given.   Follow-up in 1 month for trigger point injections of the left infraspinatus left levator scapula left  trapezius

## 2021-03-26 NOTE — Patient Instructions (Signed)
Left glenohumeral injection for frozen shoulder today  Trigger point injections for muscle pain around shoulder and shoulder blade next time

## 2021-04-02 ENCOUNTER — Ambulatory Visit: Payer: BC Managed Care – PPO | Admitting: Physical Medicine & Rehabilitation

## 2021-04-19 ENCOUNTER — Telehealth: Payer: Self-pay | Admitting: *Deleted

## 2021-04-19 MED ORDER — LEVETIRACETAM 750 MG PO TABS: 750.0000 mg | ORAL_TABLET | Freq: Two times a day (BID) | ORAL | 0 refills | Status: DC

## 2021-04-19 NOTE — Telephone Encounter (Signed)
Patient needs rx refill of Keppra sent to [facility].   Keppra 750 mg BID.  This was dose reduced from 1500 mg BID.    Route to Dr Mickeal Skinner.

## 2021-04-19 NOTE — Addendum Note (Signed)
Addended by: Ventura Sellers on: 04/19/2021 04:43 PM   Modules accepted: Orders

## 2021-05-07 ENCOUNTER — Encounter: Payer: BC Managed Care – PPO | Admitting: Physical Medicine & Rehabilitation

## 2021-06-04 ENCOUNTER — Telehealth: Payer: Self-pay

## 2021-06-04 MED ORDER — GABAPENTIN 100 MG PO CAPS: 100.0000 mg | ORAL_CAPSULE | Freq: Two times a day (BID) | ORAL | 1 refills | Status: DC

## 2021-06-04 NOTE — Telephone Encounter (Signed)
Patient called requesting refill on Gabapentin.

## 2021-06-10 ENCOUNTER — Other Ambulatory Visit: Payer: Self-pay | Admitting: Neurosurgery

## 2021-06-10 DIAGNOSIS — D329 Benign neoplasm of meninges, unspecified: Secondary | ICD-10-CM

## 2021-06-11 ENCOUNTER — Encounter: Payer: BC Managed Care – PPO | Attending: Registered Nurse | Admitting: Physical Medicine & Rehabilitation

## 2021-06-11 ENCOUNTER — Other Ambulatory Visit: Payer: Self-pay

## 2021-06-11 ENCOUNTER — Encounter: Payer: Self-pay | Admitting: Physical Medicine & Rehabilitation

## 2021-06-11 VITALS — BP 131/76 | HR 77 | Temp 98.7°F | Ht 63.0 in | Wt 154.6 lb

## 2021-06-11 DIAGNOSIS — M7918 Myalgia, other site: Secondary | ICD-10-CM | POA: Insufficient documentation

## 2021-06-11 NOTE — Progress Notes (Signed)
Trigger Point Injection  Indication: Left Periscapular trigger point injections Myofascial pain not relieved by medication management and other conservative care.  Informed consent was obtained after describing risk and benefits of the procedure with the patient, this includes bleeding, bruising, infection and medication side effects.  The patient wishes to proceed and has given written consent.  The patient was placed in a seated position.  The Left infraspinatus, Left Trap and Left Levator area was marked and prepped with Betadine.  It was entered with a 25-gauge 1-1/2 inch needle and 1 mL of 1% lidocaine was injected into each of 3 trigger points, after negative draw back for blood.  The patient tolerated the procedure well.  Post procedure instructions were given.

## 2021-06-11 NOTE — Patient Instructions (Signed)
Trigger Point Injection Trigger points are areas where you have pain. A trigger point injection is a shot given in the trigger point to help relieve pain for a few days to a few months. Common places for trigger points include: The neck. The shoulders. The upper back. The lower back. A trigger point injection will not cure long-term (chronic) pain permanently. These injections do not always work for every person. Forsome people, they can help to relieve pain for a few days to a few months. Tell a health care provider about: Any allergies you have. All medicines you are taking, including vitamins, herbs, eye drops, creams, and over-the-counter medicines. Any problems you or family members have had with anesthetic medicines. Any blood disorders you have. Any surgeries you have had. Any medical conditions you have. What are the risks? Generally, this is a safe procedure. However, problems may occur, including: Infection. Bleeding or bruising. Allergic reaction to the injected medicine. Irritation of the skin around the injection site. What happens before the procedure? Ask your health care provider about: Changing or stopping your regular medicines. This is especially important if you are taking diabetes medicines or blood thinners. Taking medicines such as aspirin and ibuprofen. These medicines can thin your blood. Do not take these medicines unless your health care provider tells you to take them. Taking over-the-counter medicines, vitamins, herbs, and supplements. What happens during the procedure?  Your health care provider will feel for trigger points. A marker may be used to circle the area for the injection. The skin over the trigger point will be washed with a germ-killing (antiseptic) solution. A thin needle is used for the injection. You may feel pain or a twitching feeling when the needle enters the trigger point. A numbing solution may be injected into the trigger point.  Sometimes a medicine to keep down inflammation is also injected. Your health care provider may move the needle around the area where the trigger point is located until the tightness and twitching goes away. After the injection, your health care provider may put gentle pressure over the injection site. The injection site will be covered with a bandage (dressing). The procedure may vary among health care providers and hospitals. What can I expect after treatment? After treatment, you may have: Soreness and stiffness for 1-2 days. A dressing. This can be taken off in a few hours or as told by your health care provider. Follow these instructions at home: Injection site care Remove your dressing as told by your health care provider. Check your injection site every day for signs of infection. Check for: Redness, swelling, or pain. Fluid or blood. Warmth. Pus or a bad smell. Managing pain, stiffness, and swelling If directed, put ice on the affected area. Put ice in a plastic bag. Place a towel between your skin and the bag. Leave the ice on for 20 minutes, 2-3 times a day. General instructions If you were asked to stop your regular medicines, ask your health care provider when you may start taking them again. Return to your normal activities as told by your health care provider. Ask your health care provider what activities are safe for you. Do not take baths, swim, or use a hot tub until your health care provider approves. You may be asked to see an occupational or physical therapist for exercises that reduce muscle strain and stretch the area of the trigger point. Keep all follow-up visits as told by your health care provider. This is important. Contact a  health care provider if: Your pain comes back, and it is worse than before the injection. You may need more injections. You have chills or a fever. The injection site becomes more painful, red, swollen, or warm to the touch. Summary A  trigger point injection is a shot given in the trigger point to help relieve pain for a few days to a few months. Common places for trigger point injections are the neck, shoulder, upper back, and lower back. These injections do not always work for every person, but for some people, the injections can help to relieve pain for a few days to a few months. Contact a health care provider if symptoms come back or they are worse than before treatment. Also, get help if the injection site becomes more painful, red, swollen, or warm to the touch. This information is not intended to replace advice given to you by your health care provider. Make sure you discuss any questions you have with your healthcare provider. Document Revised: 12/12/2018 Document Reviewed: 12/12/2018 Elsevier Patient Education  [place].

## 2021-06-26 ENCOUNTER — Other Ambulatory Visit: Payer: Self-pay

## 2021-06-26 ENCOUNTER — Ambulatory Visit
Admission: RE | Admit: 2021-06-26 | Discharge: 2021-06-26 | Disposition: A | Discharge status: Home / Self Care | Payer: BC Managed Care – PPO | Source: Ambulatory Visit | Attending: Neurosurgery | Admitting: Neurosurgery

## 2021-06-26 DIAGNOSIS — D329 Benign neoplasm of meninges, unspecified: Secondary | ICD-10-CM

## 2021-06-26 MED ORDER — GADOBENATE DIMEGLUMINE 529 MG/ML IV SOLN
14.0000 mL | Freq: Once | INTRAVENOUS | Status: AC | PRN
  Administered 2021-06-26: 14 mL via INTRAVENOUS

## 2021-08-13 ENCOUNTER — Encounter: Payer: Self-pay | Admitting: Physical Medicine & Rehabilitation

## 2021-08-13 ENCOUNTER — Other Ambulatory Visit: Payer: Self-pay

## 2021-08-13 ENCOUNTER — Encounter: Payer: BC Managed Care – PPO | Attending: Registered Nurse | Admitting: Physical Medicine & Rehabilitation

## 2021-08-13 VITALS — BP 137/75 | HR 75 | Temp 99.1°F | Ht 63.0 in | Wt 155.2 lb

## 2021-08-13 DIAGNOSIS — M7502 Adhesive capsulitis of left shoulder: Secondary | ICD-10-CM

## 2021-08-13 NOTE — Progress Notes (Signed)
Shoulder injection Left glenohumeral  ultrasound guidance)  Indication:Left Shoulder pain not relieved by medication management and other conservative care. Moderately severe Pain with Left shoulder ext rotation, flexion and  abduction  Informed consent was obtained after describing risks and benefits of the procedure with the patient, this includes bleeding, bruising, infection and medication side effects. The patient wishes to proceed and has given written consent. Patient was placed in a seated position. TheLeftshoulder was marked and prepped with betadine in the subacromial area. A 25-gauge 1-1/2 inch needle was inserted into the subacromial area. After negative draw back for blood, a solution containing 1 mL of 6 mg per ML betamethasone and 4 mL of 1% lidocaine was injected. A band aid was applied. The patient tolerated the procedure well. Post procedure instructions were given.

## 2021-08-13 NOTE — Patient Instructions (Signed)
Shoulder Impingement Syndrome Rehab Ask your health care provider which exercises are safe for you. Do exercises exactly as told by your health care provider and adjust them as directed. It is normal to feel mild stretching, pulling, tightness, or discomfort as you do these exercises. Stop right away if you feel sudden pain or your pain gets worse. Do not begin these exercises until told by your health care provider. Stretching and range-of-motion exercise This exercise warms up your muscles and joints and improves the movement and flexibility of your shoulder. This exercise also helps to relieve pain and stiffness. Passive horizontal adduction In passive adduction, you use your other hand to move the injured arm toward your body. The injured arm does not move on its own. In this movement, your arm is moved across your body in the horizontal plane (horizontal adduction). Sit or stand and pull your left / right elbow across your chest, toward your other shoulder. Stop when you feel a gentle stretch in the back of your shoulder and upper arm. Keep your arm at shoulder height. Keep your arm as close to your body as you comfortably can. Hold for __________ seconds. Slowly return to the starting position. Repeat __________ times. Complete this exercise __________ times a day. Strengthening exercises These exercises build strength and endurance in your shoulder. Endurance is the ability to use your muscles for a long time, even after they get tired. External rotation, isometric This is an exercise in which you press the back of your wrist against a door frame without moving your shoulder joint (isometric). Stand or sit in a doorway, facing the door frame. Bend your left / right elbow and place the back of your wrist against the door frame. Only the back of your wrist should be touching the frame. Keep your upper arm at your side. Gently press your wrist against the door frame, as if you are trying to  push your arm away from your abdomen (external rotation). Press as hard as you are able without pain. Avoid shrugging your shoulder while you press your wrist against the door frame. Keep your shoulder blade tucked down toward the middle of your back. Hold for __________ seconds. Slowly release the tension, and relax your muscles completely before you repeat the exercise. Repeat __________ times. Complete this exercise __________ times a day. Internal rotation, isometric This is an exercise in which you press your palm against a door frame without moving your shoulder joint (isometric). Stand or sit in a doorway, facing the door frame. Bend your left / right elbow and place the palm of your hand against the door frame. Only your palm should be touching the frame. Keep your upper arm at your side. Gently press your hand against the door frame, as if you are trying to push your arm toward your abdomen (internal rotation). Press as hard as you are able without pain. Avoid shrugging your shoulder while you press your hand against the door frame. Keep your shoulder blade tucked down toward the middle of your back. Hold for __________ seconds. Slowly release the tension, and relax your muscles completely before you repeat the exercise. Repeat __________ times. Complete this exercise __________ times a day. Scapular protraction, supine  Lie on your back on a firm surface (supine position). Hold a __________ weight in your left / right hand. Raise your left / right arm straight into the air so your hand is directly above your shoulder joint. Push the weight into the air so   your shoulder (scapula) lifts off the surface that you are lying on. The scapula will push up or forward (protraction). Do not move your head, neck, or back. Hold for __________ seconds. Slowly return to the starting position. Let your muscles relax completely before you repeat this exercise. Repeat __________ times. Complete this  exercise __________ times a day. Scapular retraction  Sit in a stable chair without armrests, or stand up. Secure an exercise band to a stable object in front of you so the band is at shoulder height. Hold one end of the exercise band in each hand. Your palms should face down. Squeeze your shoulder blades together (retraction) and move your elbows slightly behind you. Do not shrug your shoulders upward while you do this. Hold for __________ seconds. Slowly return to the starting position. Repeat __________ times. Complete this exercise __________ times a day. Shoulder extension  Sit in a stable chair without armrests, or stand up. Secure an exercise band to a stable object in front of you so the band is above shoulder height. Hold one end of the exercise band in each hand. Straighten your elbows and lift your hands up to shoulder height. Squeeze your shoulder blades together and pull your hands down to the sides of your thighs (extension). Stop when your hands are straight down by your sides. Do not let your hands go behind your body. Hold for __________ seconds. Slowly return to the starting position. Repeat __________ times. Complete this exercise __________ times a day. This information is not intended to replace advice given to you by your health care provider. Make sure you discuss any questions you have with your health care provider. Document Revised: 02/22/2019 Document Reviewed: 11/26/2018 Elsevier Patient Education  [place].

## 2021-08-20 ENCOUNTER — Ambulatory Visit (HOSPITAL_COMMUNITY)
Admission: RE | Admit: 2021-08-20 | Discharge: 2021-08-20 | Disposition: A | Discharge status: Home / Self Care | Payer: BC Managed Care – PPO | Source: Ambulatory Visit | Attending: Vascular Surgery | Admitting: Vascular Surgery

## 2021-08-20 ENCOUNTER — Other Ambulatory Visit: Payer: Self-pay

## 2021-08-20 ENCOUNTER — Ambulatory Visit (INDEPENDENT_AMBULATORY_CARE_PROVIDER_SITE_OTHER): Payer: BC Managed Care – PPO | Admitting: Physician Assistant

## 2021-08-20 VITALS — BP 125/76 | HR 89 | Temp 97.9°F | Resp 14 | Ht 63.0 in | Wt 151.0 lb

## 2021-08-20 DIAGNOSIS — I6529 Occlusion and stenosis of unspecified carotid artery: Secondary | ICD-10-CM | POA: Diagnosis not present

## 2021-08-20 DIAGNOSIS — I773 Arterial fibromuscular dysplasia: Secondary | ICD-10-CM

## 2021-08-20 NOTE — Progress Notes (Addendum)
Carotid Artery Follow-Up   VASCULAR SURGERY ASSESSMENT & PLAN:   [name] [name] is a 65 y.o. female presents for surveillance of carotid artery stenosis with known fibromuscular dysplasia bilaterally.  Surgical history significant for right frontal craniotomy by Dr. Kathyrn Sheriff for resection of meningioma in October 2014.  Most recently she underwent another craniotomy for resection of multiple tumors in June 2021. Post-operatively, she had decreased motor function of left upper extremity and left foot drop.    Bilateral carotid artery stenosis: The patient has no symptoms referable to carotid artery stenosis.  Duplex examination today is stable as compared to 6 months ago.  We reviewed the signs and symptoms of stroke/TIA and advised the patient to call EMS should these occur.    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 Advised to continue observation and discuss with PCP.  Continue optimal medical management of hypertension, hyperlipidemia and follow-up with primary care physician. Encouraged continuation of complete smoking cessation. Continue the following medications: aspirin and statin Follow-up in 6 months with carotid duplex ultrasound.  SUBJECTIVE:   She reports residual facial asymmetry, left upper and left lower extremity weakness which has not changed over the last 6 months.  The patient denies monocular blindness, slurred speech or trouble with balance or coordination. She reports a small lump at base of right neck which is not painful and unchanged in size for approximatley 2 years. PHYSICAL EXAM:   Vitals:   08/20/21 1038  BP: 125/76  Pulse: 89  Resp: 14  Temp: 97.9 F (36.6 C)  SpO2: 98%     General appearance: Well-developed, well-nourished in no apparent distress Neurologic: Alert and oriented x4, tongue is midline, face mildly asymmetric, speech fluent, 5 out of 5 right/ 4/5 left upper extremity grip strength, triceps and biceps strength. 5/5 right and  4/5 left lower extremity quad/hamstring strength Cardiovascular: Heart rate and rhythm are regular.  Pedal pulses are palpable.  No carotid bruits. Respirations: Nonlabored Right Upper back/neck: normal skin texture; soft, pliable approximately 2 cm soft tissue mass. Nontender   NON-INVASIVE VASCULAR STUDIES   08/20/2021 Summary:  Right Carotid: 40-59% stenosis in the mid and distal ICA consistent with fibromuscular dysplasia.   Left Carotid: 60-79% stenosis in the mid ICA and 40-59% stenosis in the  distal ICA consistent with fibromuscular dysplasia.   Vertebrals:  Bilateral vertebral arteries demonstrate antegrade flow.  Subclavians: Normal flow hemodynamics were seen in bilateral subclavian arteries.   *See table(s) above for measurements and observations.      Preliminary    02/16/2021 Summary:  Right Carotid: 40-59% stenosis in the mid and distal ICA consistent with  fibromuscular dysplasia.   Left Carotid: 60-79% stenosis in the mid ICA and 40-59% stenosis in the  distal ICA consistent with fibromuscular dysplasia.   Vertebrals:  Bilateral vertebral arteries demonstrate antegrade flow.  Subclavians: Normal flow hemodynamics were seen in bilateral subclavian               arteries.   *See table(s) above for measurements and observations.       Electronically signed by Monica Martinez MD on 02/16/2021 at 5:16:00 PM.         Final    PROBLEM LIST:    The patient's past medical history, past surgical history, family history, social history, allergy list and medication list are reviewed.   CURRENT MEDS:    Current Outpatient Medications:    acetaminophen (TYLENOL) 325 MG tablet, Take 650 mg by mouth every 6 (  six) hours as needed for moderate pain or headache., Disp: , Rfl:    aspirin 81 MG chewable tablet, Chew 1 tablet (81 mg total) by mouth daily., Disp: , Rfl:    atorvastatin (LIPITOR) 40 MG tablet, Take 1 tablet (40 mg total) by mouth daily., Disp: 30 tablet,  Rfl: 0   Cholecalciferol (VITAMIN D3) 50 MCG (2000 UT) TABS, Take 1 tablet by mouth daily., Disp: 30 tablet, Rfl: 0   gabapentin (NEURONTIN) 100 MG capsule, Take 1 capsule (100 mg total) by mouth 2 (two) times daily., Disp: 60 capsule, Rfl: 1   levETIRAcetam (KEPPRA) 750 MG tablet, Take 1 tablet (750 mg total) by mouth 2 (two) times daily., Disp: 180 tablet, Rfl: 0   metoprolol succinate (TOPROL-XL) 25 MG 24 hr tablet, Take 0.5 tablets (12.5 mg total) by mouth daily., Disp: 30 tablet, Rfl: 0   OVER THE COUNTER MEDICATION, Take 1 tablet by mouth daily. Vit B Stress Tablet, Disp: , Rfl:    potassium chloride SA (K-DUR,KLOR-CON) 20 MEQ tablet, Take 20 mEq by mouth every other day., Disp: , Rfl:    senna (SENOKOT) 8.6 MG TABS tablet, Take 1 tablet (8.6 mg total) by mouth at bedtime., Disp: 120 tablet, Rfl: 0   REVIEW OF SYSTEMS:   [X]  denotes positive finding, [ ]  denotes negative finding Cardiac  Comments:  Chest pain or chest pressure:    Shortness of breath upon exertion:    Short of breath when lying flat:    Irregular heart rhythm:        Vascular    Pain in calf, thigh, or hip brought on by ambulation:    Pain in feet at night that wakes you up from your sleep:     Blood clot in your veins:    Leg swelling:         Pulmonary    Oxygen at home:    Productive cough:     Wheezing:         Neurologic    Sudden weakness in arms or legs:     Sudden numbness in arms or legs:     Sudden onset of difficulty speaking or slurred speech:    Temporary loss of vision in one eye:     Problems with dizziness:         Gastrointestinal    Blood in stool:     Vomited blood:         Genitourinary    Burning when urinating:     Blood in urine:        Psychiatric    Major depression:         Hematologic    Bleeding problems:    Problems with blood clotting too easily:        Skin    Rashes or ulcers:        Constitutional    Fever or chills:     Barbie Banner, PA-C  Office:  [phone] 08/20/2021 Dr. Virl Cagey

## 2021-08-23 ENCOUNTER — Other Ambulatory Visit: Payer: Self-pay

## 2021-08-23 DIAGNOSIS — I6529 Occlusion and stenosis of unspecified carotid artery: Secondary | ICD-10-CM

## 2021-08-26 ENCOUNTER — Other Ambulatory Visit: Payer: Self-pay | Admitting: Radiation Therapy

## 2021-08-30 ENCOUNTER — Inpatient Hospital Stay: Payer: BC Managed Care – PPO | Attending: Internal Medicine

## 2021-09-02 ENCOUNTER — Other Ambulatory Visit: Payer: Self-pay | Admitting: *Deleted

## 2021-09-09 ENCOUNTER — Inpatient Hospital Stay (HOSPITAL_BASED_OUTPATIENT_CLINIC_OR_DEPARTMENT_OTHER): Payer: BC Managed Care – PPO | Admitting: Internal Medicine

## 2021-09-09 ENCOUNTER — Telehealth: Payer: Self-pay | Admitting: Internal Medicine

## 2021-09-09 DIAGNOSIS — R569 Unspecified convulsions: Secondary | ICD-10-CM | POA: Diagnosis not present

## 2021-09-09 DIAGNOSIS — D329 Benign neoplasm of meninges, unspecified: Secondary | ICD-10-CM | POA: Diagnosis not present

## 2021-09-09 MED ORDER — LEVETIRACETAM 500 MG PO TABS: 500.0000 mg | ORAL_TABLET | Freq: Two times a day (BID) | ORAL | 5 refills | Status: DC

## 2021-09-09 NOTE — Progress Notes (Signed)
I connected with [name] [name] on 09/09/21 at 12:00 PM EDT by telephone visit and verified that I am speaking with the correct person using two identifiers.  I discussed the limitations, risks, security and privacy concerns of performing an evaluation and management service by telemedicine and the availability of in-person appointments. I also discussed with the patient that there may be a patient responsible charge related to this service. The patient expressed understanding and agreed to proceed.  Other persons participating in the visit and their role in the encounter:  n/a  Patient's location:  Home  Provider's location:  Office  Chief Complaint:  Meningioma [facility])  Seizure ([place])  History of Present Ilness: [name] [name] describes no new or progressive neurologic deficits.  She continues to experience some irritability at times with the [place].  No headaches, gait disturbance.  Following with Dr. Kathyrn Sheriff for meningioma.    Observations: Language and cognition at baseline  Assessment and Plan: Meningioma ([facility])  Seizure ([place])  Clinically stable, seizure free now greater than 1 year. Recommended decreasing Keppra to 500mg  BID due to tolerability.  She is agreeable with this plan.  Follow Up Instructions: RTC in 6 months.  MRIs ordered and followed by Dr. Kathyrn Sheriff  I discussed the assessment and treatment plan with the patient.  The patient was provided an opportunity to ask questions and all were answered.  The patient agreed with the plan and demonstrated understanding of the instructions.    The patient was advised to call back or seek an in-person evaluation if the symptoms worsen or if the condition fails to improve as anticipated.  I provided 5-10 minutes of non-face-to-face time during this enocunter.  Ventura Sellers, MD   I provided 15 minutes of non face-to-face telephone visit time during this encounter, and > 50% was spent counseling as documented under my assessment &  plan.

## 2021-09-09 NOTE — Telephone Encounter (Signed)
Scheduled per 10/27 los, pt has been called and confirmed appt

## 2021-09-27 ENCOUNTER — Other Ambulatory Visit: Payer: Self-pay | Admitting: Physical Medicine & Rehabilitation

## 2021-09-27 NOTE — Telephone Encounter (Signed)
Pharmacy and patient are calling to request refill for Gabapentin 100 mg BID.

## 2021-09-29 ENCOUNTER — Other Ambulatory Visit: Payer: Self-pay

## 2021-09-29 ENCOUNTER — Telehealth: Payer: Self-pay

## 2021-09-29 NOTE — Telephone Encounter (Signed)
Refill request for Gabapentin.

## 2021-10-27 ENCOUNTER — Encounter: Payer: Self-pay | Admitting: Internal Medicine

## 2021-11-24 ENCOUNTER — Encounter: Payer: Self-pay | Admitting: Gastroenterology

## 2021-12-15 ENCOUNTER — Other Ambulatory Visit: Payer: Self-pay | Admitting: Neurosurgery

## 2021-12-15 DIAGNOSIS — D329 Benign neoplasm of meninges, unspecified: Secondary | ICD-10-CM

## 2022-01-07 ENCOUNTER — Ambulatory Visit
Admission: RE | Admit: 2022-01-07 | Discharge: 2022-01-07 | Disposition: A | Discharge status: Home / Self Care | Payer: BC Managed Care – PPO | Source: Ambulatory Visit | Attending: Neurosurgery | Admitting: Neurosurgery

## 2022-01-07 ENCOUNTER — Other Ambulatory Visit: Payer: Self-pay

## 2022-01-07 DIAGNOSIS — D329 Benign neoplasm of meninges, unspecified: Secondary | ICD-10-CM

## 2022-01-07 MED ORDER — GADOBENATE DIMEGLUMINE 529 MG/ML IV SOLN
15.0000 mL | Freq: Once | INTRAVENOUS | Status: AC | PRN
  Administered 2022-01-07: 15 mL via INTRAVENOUS

## 2022-01-28 IMAGING — MR MR HEAD WO/W CM
12 of 14 series · 40 of 48 positions shown · IV contrast (gadavist)
Comparison: 04/13/2020

CLINICAL DATA: Recurrent meningioma post resection

EXAM:
MRI HEAD WITHOUT AND WITH CONTRAST
TECHNIQUE: Multiplanar, multiecho pulse sequences of the brain and surrounding
structures were obtained without and with intravenous contrast.
CONTRAST:  8mL GADAVIST GADOBUTROL 1 MMOL/ML IV SOLN

[Series 5: DWI · axial · 3.0mm · 0.88mm/px · z∈[-75,+71]mm · 8 of 100 slices shown (1 of 4)]
[im 1/100]
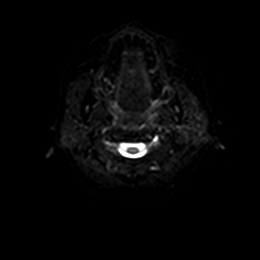
[im 15/100]
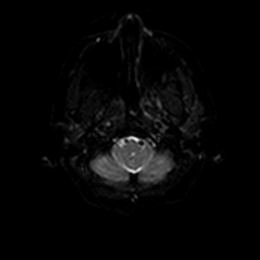
[im 29/100]
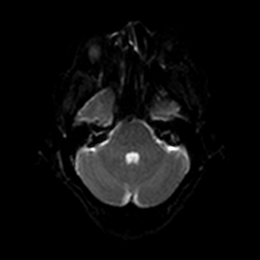
[im 43/100]
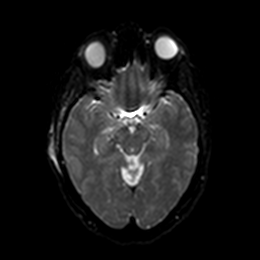
[im 57/100]
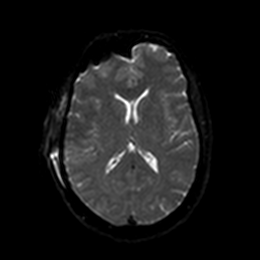
[im 71/100]
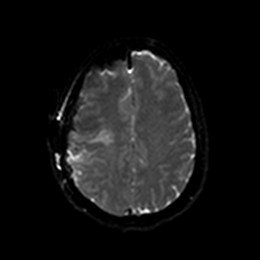
[im 85/100]
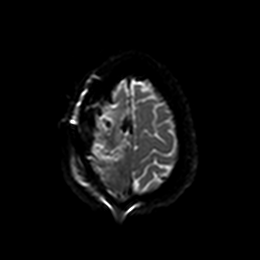
[im 100/100]
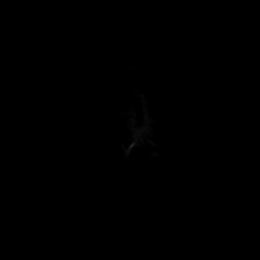

[Series 6: DWI · axial · 3.0mm · 0.88mm/px · z∈[-75,+71]mm · 5 of 50 slices shown (2 of 4)]
[im 1/50]
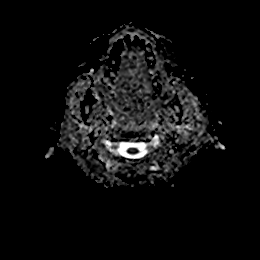
[im 13/50]
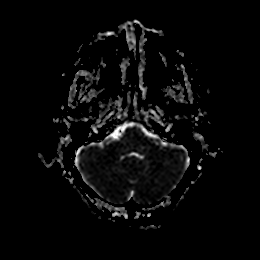
[im 25/50]
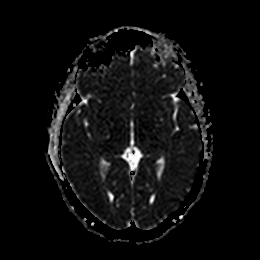
[im 37/50]
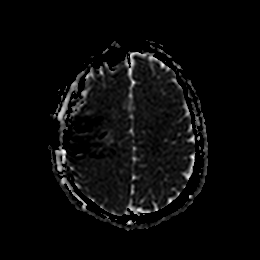
[im 50/50]
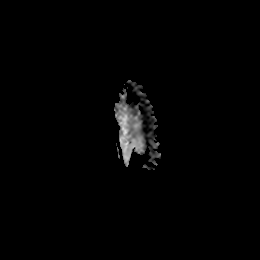

[Series 7: DWI · coronal · 4.0mm · 0.88mm/px · 5 of 66 slices shown (3 of 4)]
[im 1/66]
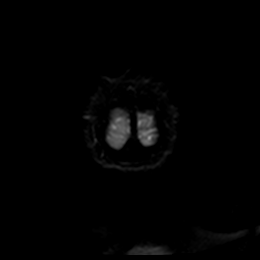
[im 17/66]
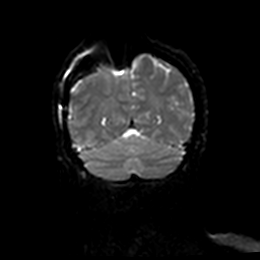
[im 33/66]
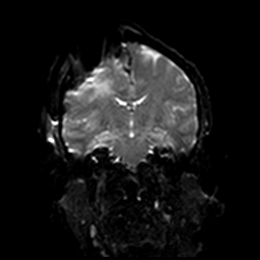
[im 49/66]
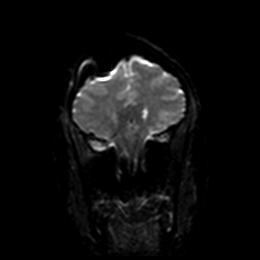
[im 66/66]
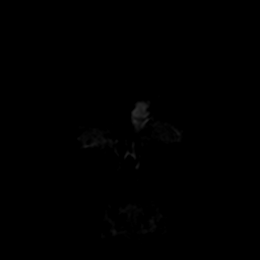

[Series 8: DWI · coronal · 4.0mm · 0.88mm/px · 2 of 33 slices shown (4 of 4)]
[im 1/33]
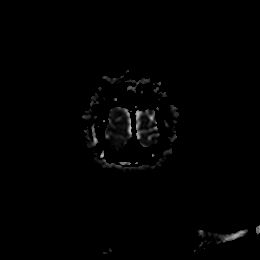
[im 33/33]
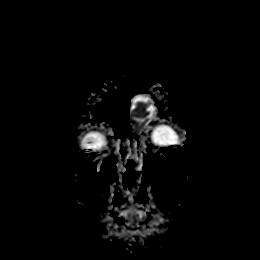

[Series 9: T1 · sagittal · 5.0mm · 0.75mm/px · 2 of 23 slices shown]
[im 1/23]
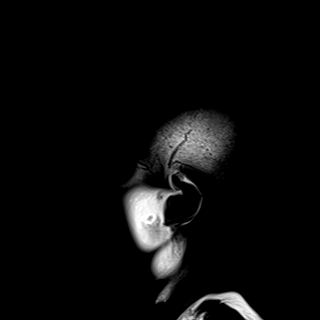
[im 23/23]
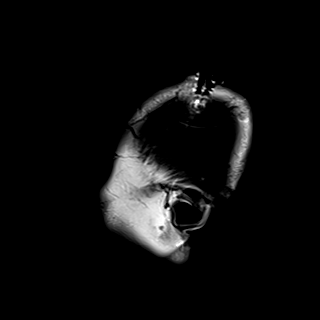

[Series 10: T2 · axial · 5.0mm · 0.72mm/px · z∈[-78,+76]mm · 2 of 27 slices shown]
[im 1/27]
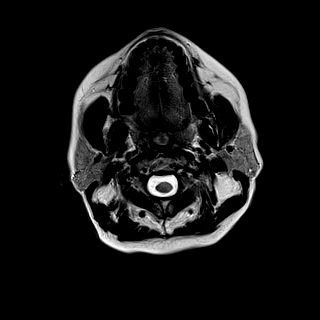
[im 27/27]
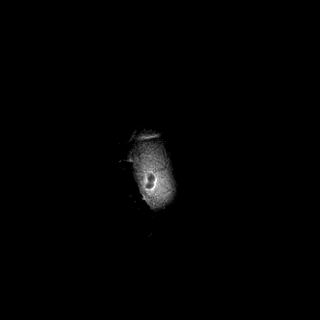

[Series 11: FLAIR · axial · 5.0mm · 0.45mm/px · z∈[-82,+73]mm · 2 of 27 slices shown]
[im 1/27]
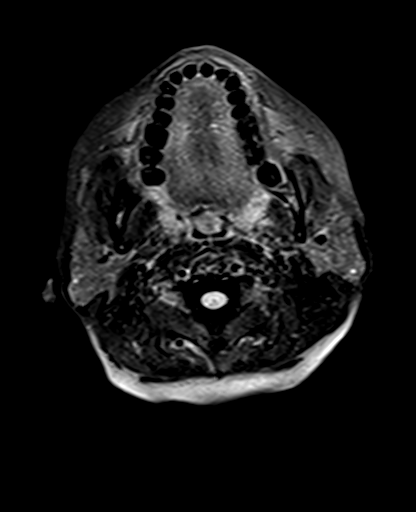
[im 27/27]
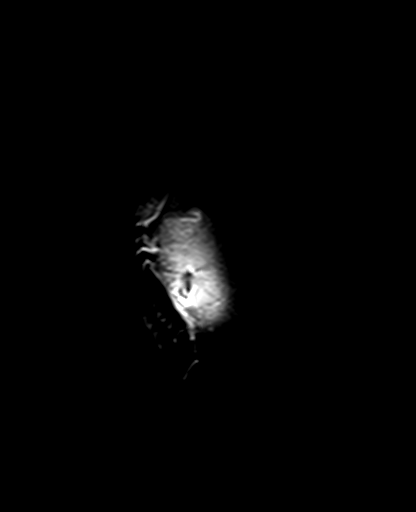

[Series 13: pha_images · axial · 3.0mm · 0.90mm/px · z∈[-87,+87]mm · 4 of 59 slices shown]
[im 1/59]
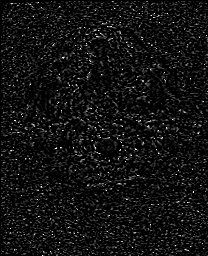
[im 20/59]
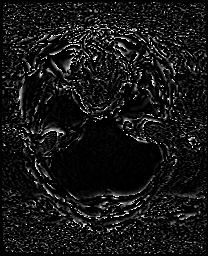
[im 39/59]
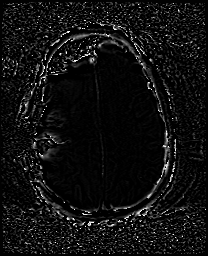
[im 59/59]
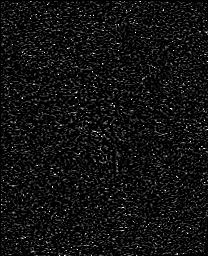

[Series 14: swi_images · axial · 3.0mm · 0.90mm/px · z∈[-87,+87]mm · 4 of 60 slices shown]
[im 1/60]
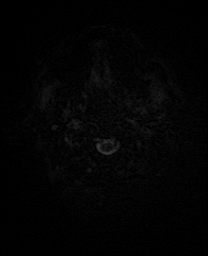
[im 20/60]
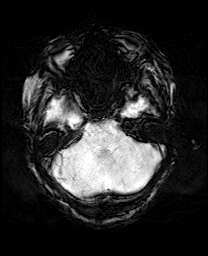
[im 40/60]
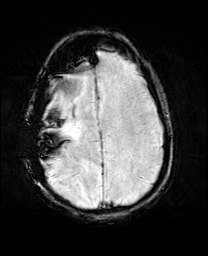
[im 60/60]
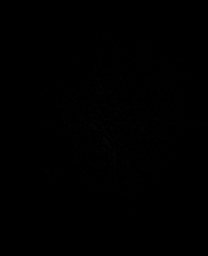

[Series 17: T2 post-contrast · coronal · 5.0mm · 0.72mm/px · 2 of 28 slices shown]
[im 1/28]
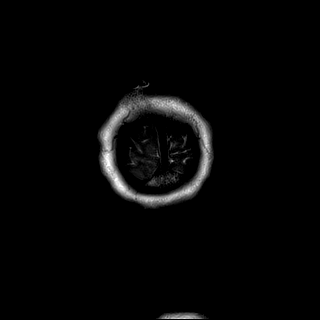
[im 28/28]
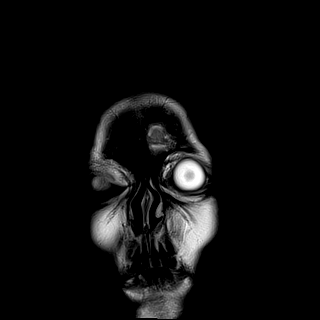

[Series 19: T1 post-contrast · coronal · 5.0mm · 0.34mm/px · 2 of 28 slices shown (1 of 2)]
[im 1/28]
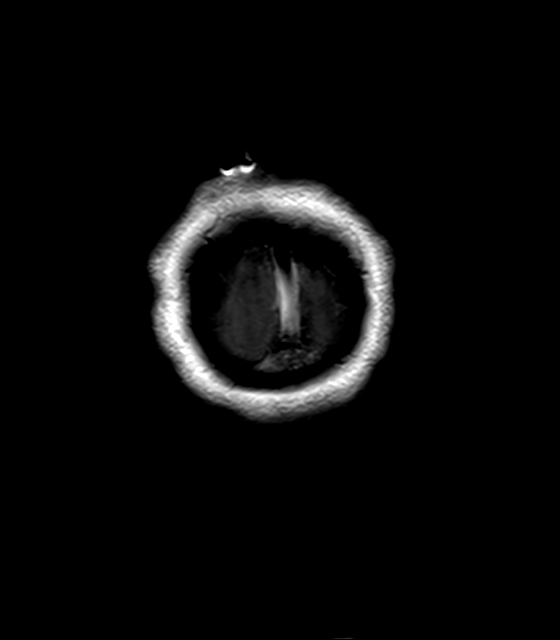
[im 28/28]
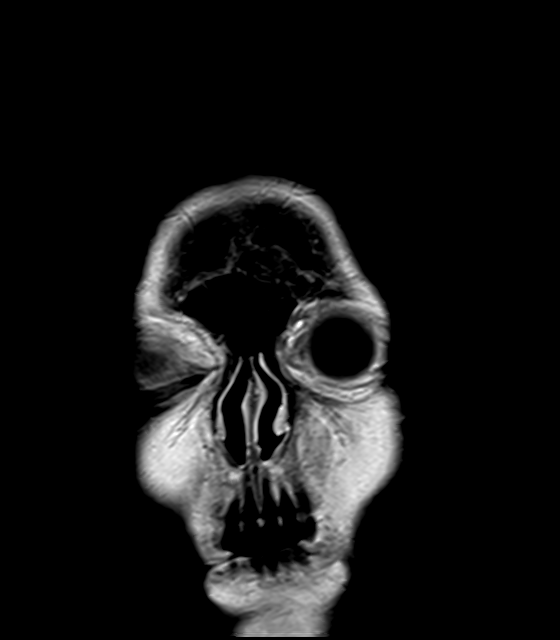

[Series 20: T1 post-contrast · sagittal · 5.0mm · 0.72mm/px · 2 of 23 slices shown (2 of 2)]
[im 1/23]
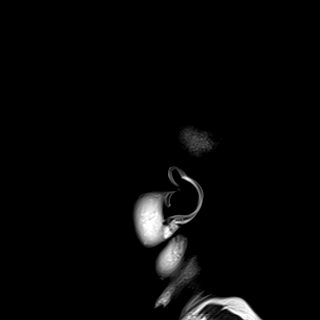
[im 23/23]
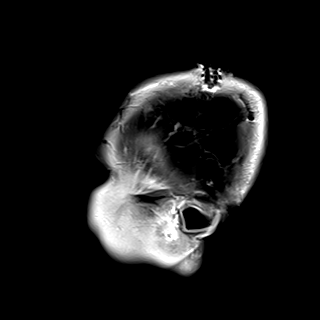

[40 of 48 positions shown; findings below may reference images not displayed]

FINDINGS: Brain: Postoperative changes of right vertex craniotomy for
resection of underlying meningioma. There is a thin underlying
extra-axial collection of air, fluid, and blood products.
Extra-axial air is present along the anterior right frontal
convexity. Reduced diffusion in the underlying right frontoparietal
lobes likely reflects a combination postoperative contusion/ischemia
and blood products, noting subarachnoid and also probable
parenchymal susceptibility.

There has been significant resection of the recurrent meningioma.
There is possible small volume residual enhancing nodular soft
tissue along the right aspect falx on series 18, image 46 measuring
approximately 1.4 x 0.6 cm axially. Possible additional residual
tumor within the superior sagittal sinus with adjacent minimal
contralateral extension (series 18, image 50). There is mild edema.
No significant mass effect. No hydrocephalus.

Vascular: Major vessel flow voids at the skull base are preserved.

Skull and upper cervical spine: Postoperative changes. Normal marrow
signal is preserved.

Sinuses/Orbits: Paranasal sinuses are aerated. Orbits are
unremarkable.

Other: Sella is unremarkable.  Mastoid air cells are clear.
IMPRESSION: Postoperative changes of extensive resection of recurrent
meningioma. Possible two small areas of residual tumor detailed
above for which attention on follow-up is recommended

## 2022-01-31 ENCOUNTER — Other Ambulatory Visit: Payer: Self-pay | Admitting: Physical Medicine & Rehabilitation

## 2022-02-11 ENCOUNTER — Encounter: Payer: BC Managed Care – PPO | Admitting: Physical Medicine & Rehabilitation

## 2022-02-22 ENCOUNTER — Other Ambulatory Visit: Payer: Self-pay | Admitting: Internal Medicine

## 2022-02-22 DIAGNOSIS — D329 Benign neoplasm of meninges, unspecified: Secondary | ICD-10-CM

## 2022-02-23 ENCOUNTER — Other Ambulatory Visit: Payer: Self-pay

## 2022-02-24 ENCOUNTER — Ambulatory Visit: Payer: BC Managed Care – PPO | Admitting: Gastroenterology

## 2022-02-25 ENCOUNTER — Ambulatory Visit: Payer: BC Managed Care – PPO

## 2022-02-25 ENCOUNTER — Encounter (HOSPITAL_COMMUNITY): Payer: BC Managed Care – PPO

## 2022-03-07 ENCOUNTER — Other Ambulatory Visit: Payer: Self-pay | Admitting: *Deleted

## 2022-03-07 DIAGNOSIS — I6529 Occlusion and stenosis of unspecified carotid artery: Secondary | ICD-10-CM

## 2022-03-10 ENCOUNTER — Inpatient Hospital Stay: Payer: BC Managed Care – PPO | Attending: Internal Medicine | Admitting: Internal Medicine

## 2022-03-10 DIAGNOSIS — R569 Unspecified convulsions: Secondary | ICD-10-CM | POA: Diagnosis not present

## 2022-03-10 DIAGNOSIS — D329 Benign neoplasm of meninges, unspecified: Secondary | ICD-10-CM

## 2022-03-10 MED ORDER — LEVETIRACETAM 250 MG PO TABS: 250.0000 mg | ORAL_TABLET | Freq: Two times a day (BID) | ORAL | 3 refills | Status: DC

## 2022-03-10 NOTE — Progress Notes (Signed)
I connected with [name] [name] on 03/10/22 at 12:00 PM EDT by telephone visit and verified that I am speaking with the correct person using two identifiers.  ?I discussed the limitations, risks, security and privacy concerns of performing an evaluation and management service by telemedicine and the availability of in-person appointments. I also discussed with the patient that there may be a patient responsible charge related to this service. The patient expressed understanding and agreed to proceed.  ?Other persons participating in the visit and their role in the encounter:  n/a  ?Patient's location:  Home  ?Provider's location:  Office  ?Chief Complaint:  Seizure [facility]) ? ?Meningioma ([place]) - Plan: levETIRAcetam (KEPPRA) 250 MG tablet ? ?History of Present Ilness: [name] [name] denies new or progressive neurologic deficits today.  No further seizures, last event was May of 2021.  Continues on keppra '500mg'$  BID.  Neuropathy is stable, on gabapentin '100mg'$  BID. ?Observations: Language and cognition at baseline ?Assessment and Plan: Seizure ([place]) ? ?Meningioma ([place]) - Plan: levETIRAcetam (KEPPRA) 250 MG tablet ? ?Clinically and radiographically stable, reviwed MRI brain from February. ? ?Will decrease Keppra to '250mg'$  BID. ? ?Follow Up Instructions: Follow with Dr. Kathyrn Sheriff for meningioma imaging.  RTC in 1 year for seizure visit. ? ?I discussed the assessment and treatment plan with the patient.  The patient was provided an opportunity to ask questions and all were answered.  The patient agreed with the plan and demonstrated understanding of the instructions.   ? ?The patient was advised to call back or seek an in-person evaluation if the symptoms worsen or if the condition fails to improve as anticipated.  I provided 5-10 minutes of non-face-to-face time during this enocunter. ? ?Ventura Sellers, MD ? ? ?I provided 22 minutes of non face-to-face telephone visit time during this encounter, and > 50% was spent  counseling as documented under my assessment & plan.  ? ?

## 2022-03-11 ENCOUNTER — Telehealth: Payer: Self-pay | Admitting: Internal Medicine

## 2022-03-11 NOTE — Telephone Encounter (Signed)
Per 4/27 los called and spoke to pt about appointment.  Pt confirmed appointment  ?

## 2022-03-15 NOTE — Progress Notes (Signed)
?HISTORY AND PHYSICAL  ? ? ? ?CC:  follow up. ?Requesting Provider:  [facility] ? ?HPI: This is a 66 y.o. female here for follow up for carotid artery stenosis with known fibromuscular dysplasia bilaterally.  Surgical history significant for right frontal craniotomy by Dr. Kathyrn Sheriff for resection of meningioma in October 2014.  Most recently she underwent another craniotomy for resection of multiple tumors in June 2021. Post-operatively, she had decreased motor function of left upper extremity and left foot drop.  ? ?Pt was last seen 08/20/2021 and at that time she was doing well and duplex exam was stable from previous exam.  She did have a soft tissue mass of base of right neck consistent with lipoma and she was advised to continue observation and discuss with PCP.   ? ?Pt returns today for follow up.   ? ?Pt denies any amaurosis fugax, speech difficulties, weakness, numbness, paralysis or clumsiness or facial droop.   ? ?She states she had some pain in the left heel earlier today but this has resolved.  She states she does have some numbness in the left hand and left sided weakness, but this has been present since her brain surgery and has not changed.  She denies any non healing wounds, rest pain or claudication.  She does get some cramps in her feet at night.  She states she can hear her heartbeat in her ears when it is quiet and this is annoying to her.   ? ?The pt is on a statin for cholesterol management.  ?The pt is on a daily aspirin.   Other AC:  none ?The pt is on BB for hypertension.   ?The pt does not have diabetes ?Tobacco hx:  former ? ?Pt does not have family hx of AAA. ? ?Past Medical History:  ?Diagnosis Date  ? Bruises easily   ? Carotid artery occlusion   ? Dizziness   ? Headache(784.0)   ? Hyperlipidemia   ? takes Atorvastatin daily  ? PONV (postoperative nausea and vomiting)   ? Stroke [facility])   ? TIA (transient ischemic attack)   ? ? ?Past Surgical History:  ?Procedure Laterality  Date  ? ABDOMINAL HYSTERECTOMY  2002  ? APPLICATION OF CRANIAL NAVIGATION N/A 04/16/2020  ? Procedure: APPLICATION OF CRANIAL NAVIGATION;  Surgeon: Consuella Lose, MD;  Location: [place];  Service: Neurosurgery;  Laterality: N/A;  ? COLONOSCOPY    ? CRANIOTOMY Right 09/10/2013  ? Procedure: Craniotomy for tumor excision;  Surgeon: Consuella Lose, MD;  Location: [place] NEUR[facility] ORS;  Service: Neurosurgery;  Laterality: Right;  Craniotomy for meningioma with stealth  ? CRANIOTOMY N/A 04/16/2020  ? Procedure: STEREOTACTIC CRANIOTOMY FOR RESECTION OF MENINGIOMA;  Surgeon: Consuella Lose, MD;  Location: [place];  Service: Neurosurgery;  Laterality: N/A;  ? [place] OF UTERUS  1980  ? left hand surgery  2007 and 2008  ? plates and screws  ? ? ?Allergies  ?Allergen Reactions  ? Adhesive [Tape]   ?  Rash popped up after using bandaids, telemetry stickers.  ? Other   ? ? ?Current Outpatient Medications  ?Medication Sig Dispense Refill  ? gabapentin (NEURONTIN) 100 MG capsule Take 1 capsule by mouth twice daily 60 capsule 0  ? acetaminophen (TYLENOL) 325 MG tablet Take 650 mg by mouth every 6 (six) hours as needed for moderate pain or headache.    ? aspirin 81 MG chewable tablet Chew 1 tablet (81 mg total) by mouth daily.    ?  atorvastatin (LIPITOR) 40 MG tablet Take 1 tablet (40 mg total) by mouth daily. 30 tablet 0  ? Cholecalciferol (VITAMIN D3) 50 MCG (2000 UT) TABS Take 1 tablet by mouth daily. 30 tablet 0  ? levETIRAcetam (KEPPRA) 250 MG tablet Take 1 tablet (250 mg total) by mouth 2 (two) times daily. 180 tablet 3  ? metoprolol succinate (TOPROL-XL) 25 MG 24 hr tablet Take 0.5 tablets (12.5 mg total) by mouth daily. 30 tablet 0  ? OVER THE COUNTER MEDICATION Take 1 tablet by mouth daily. Vit B Stress Tablet    ? potassium chloride SA (K-DUR,KLOR-CON) 20 MEQ tablet Take 20 mEq by mouth every other day.    ? senna (SENOKOT) 8.6 MG TABS tablet Take 1 tablet (8.6 mg total) by mouth at bedtime. 120 tablet 0  ? ?No  current facility-administered medications for this visit.  ? ? ?Family History  ?Problem Relation Age of Onset  ? Cancer Father   ?     Prostate  ? Deep vein thrombosis Father   ? ? ?Social History  ? ?Socioeconomic History  ? Marital status: Married  ?  Spouse name: Not on file  ? Number of children: Not on file  ? Years of education: Not on file  ? Highest education level: Not on file  ?Occupational History  ? Not on file  ?Tobacco Use  ? Smoking status: Former  ? Smokeless tobacco: Never  ? Tobacco comments:  ?  quit 60yr ago  ?Vaping Use  ? Vaping Use: Never used  ?Substance and Sexual Activity  ? Alcohol use: No  ? Drug use: No  ? Sexual activity: Not Currently  ?  Birth control/protection: Surgical  ?Other Topics Concern  ? Not on file  ?Social History Narrative  ? Not on file  ? ?Social Determinants of Health  ? ?Financial Resource Strain: Not on file  ?Food Insecurity: Not on file  ?Transportation Needs: Not on file  ?Physical Activity: Not on file  ?Stress: Not on file  ?Social Connections: Not on file  ?Intimate Partner Violence: Not on file  ? ? ? ?REVIEW OF SYSTEMS:  ? ?'[X]'$  denotes positive finding, '[ ]'$  denotes negative finding ?Cardiac  Comments:  ?Chest pain or chest pressure:    ?Shortness of breath upon exertion:    ?Short of breath when lying flat:    ?Irregular heart rhythm:    ?    ?Vascular    ?Pain in calf, thigh, or hip brought on by ambulation:    ?Pain in feet at night that wakes you up from your sleep:     ?Blood clot in your veins:    ?Leg swelling:     ?    ?Pulmonary    ?Oxygen at home:    ?Productive cough:     ?Wheezing:     ?    ?Neurologic    ?Sudden weakness in arms or legs:     ?Sudden numbness in arms or legs:     ?Sudden onset of difficulty speaking or slurred speech:    ?Temporary loss of vision in one eye:     ?Problems with dizziness:     ?    ?Gastrointestinal    ?Blood in stool:     ?Vomited blood:     ?    ?Genitourinary    ?Burning when urinating:     ?Blood in urine:    ?     ?Psychiatric    ?Major depression:     ?    ?  Hematologic    ?Bleeding problems:    ?Problems with blood clotting too easily:    ?    ?Skin    ?Rashes or ulcers:    ?    ?Constitutional    ?Fever or chills:    ? ? ?PHYSICAL EXAMINATION: ? ?Today's Vitals  ? 03/18/22 1041 03/18/22 1043  ?BP: 140/90 130/81  ?Pulse: 73   ?Resp: 20   ?Temp: 97.9 ?F (36.6 ?C)   ?TempSrc: Temporal   ?SpO2: 98%   ?Weight: 156 lb 14.4 oz (71.2 kg)   ?Height: '5\' 3"'$  (1.6 m)   ? ?Body mass index is 27.79 kg/m?. ? ? ?General:  WDWN in NAD; vital signs documented above ?Gait: Not observed ?HENT: WNL, normocephalic ?Pulmonary: normal non-labored breathing ?Cardiac: regular HR, without carotid bruits ?Abdomen: soft, NT; aortic pulse is not palpable ?Skin: without rashes ?Vascular Exam/Pulses: ? Right Left  ?Radial 2+ (normal) 2+ (normal)  ?DP 2+ (normal) 2+ (normal)  ?PT Unable to palpate Unable to palpate  ? ?Extremities: without ischemic changes, without Gangrene , without cellulitis; without open wounds ?Musculoskeletal: no muscle wasting or atrophy  ?Neurologic: A&O X 3; moving all extremities equally; speech is fluent/normal ?Psychiatric:  The pt has Normal affect. ? ? ?Non-Invasive Vascular Imaging:   ?Carotid Duplex on 03/18/2022 ?Right:  40-59% ICA stenosis ?Left:  40-59% ICA stenosis ?Vertebrals:  Bilateral vertebral arteries demonstrate antegrade flow.  ?Subclavians: Normal flow hemodynamics were seen in bilateral subclavian arteries.  ? ?Previous Carotid duplex on 08/20/2021: ?Right: 40-59% ICA stenosis ?Left:   60-79% ICA stenosis ? ? ? ?ASSESSMENT/PLAN:: 66 y.o. female here for follow up carotid artery stenosis with known fibromuscular dysplasia bilaterally.  Surgical history significant for right frontal craniotomy by Dr. Kathyrn Sheriff for resection of meningioma in October 2014.  Most recently she underwent another craniotomy for resection of multiple tumors in June 2021. Post-operatively, she had decreased motor function of left upper  extremity and left foot drop.  ? ? ?-duplex today reveals an improvement in stenosis on the left and brought her down to the 40-59% range.  The right remains in the 40-59% range.   She remains asympt

## 2022-03-17 ENCOUNTER — Ambulatory Visit: Payer: BC Managed Care – PPO | Admitting: Gastroenterology

## 2022-03-18 ENCOUNTER — Ambulatory Visit (HOSPITAL_COMMUNITY)
Admission: RE | Admit: 2022-03-18 | Discharge: 2022-03-18 | Disposition: A | Discharge status: Home / Self Care | Payer: BC Managed Care – PPO | Source: Ambulatory Visit | Attending: Vascular Surgery | Admitting: Vascular Surgery

## 2022-03-18 ENCOUNTER — Ambulatory Visit: Payer: BC Managed Care – PPO | Admitting: Physician Assistant

## 2022-03-18 VITALS — BP 130/81 | HR 73 | Temp 97.9°F | Resp 20 | Ht 63.0 in | Wt 156.9 lb

## 2022-03-18 DIAGNOSIS — I6529 Occlusion and stenosis of unspecified carotid artery: Secondary | ICD-10-CM

## 2022-03-18 DIAGNOSIS — I773 Arterial fibromuscular dysplasia: Secondary | ICD-10-CM | POA: Diagnosis not present

## 2022-03-22 ENCOUNTER — Other Ambulatory Visit: Payer: Self-pay | Admitting: *Deleted

## 2022-03-22 DIAGNOSIS — I6529 Occlusion and stenosis of unspecified carotid artery: Secondary | ICD-10-CM

## 2022-03-28 ENCOUNTER — Encounter: Payer: Self-pay | Admitting: Internal Medicine

## 2022-04-20 ENCOUNTER — Ambulatory Visit: Payer: BC Managed Care – PPO | Admitting: Internal Medicine

## 2022-04-20 ENCOUNTER — Encounter (INDEPENDENT_AMBULATORY_CARE_PROVIDER_SITE_OTHER): Payer: Self-pay

## 2022-04-29 ENCOUNTER — Encounter
Payer: BC Managed Care – PPO | Attending: Physical Medicine & Rehabilitation | Admitting: Physical Medicine & Rehabilitation

## 2022-04-29 ENCOUNTER — Encounter: Payer: Self-pay | Admitting: Physical Medicine & Rehabilitation

## 2022-04-29 VITALS — BP 138/87 | HR 81 | Ht 64.0 in | Wt 156.0 lb

## 2022-04-29 DIAGNOSIS — M65841 Other synovitis and tenosynovitis, right hand: Secondary | ICD-10-CM | POA: Diagnosis not present

## 2022-04-29 NOTE — Patient Instructions (Signed)
Use voltaren gel to base of Right middle 3-4 times per day

## 2022-04-29 NOTE — Progress Notes (Signed)
Subjective:    Patient ID: [name] [name], female    DOB: 03-Oct-1956, 66 y.o.   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  She has seen her neuro oncologist Dr. Mickeal Skinner who reduced dose of Keppra.  According to patient, Dr. Mickeal Skinner thought from a seizure standpoint patient can drive.  The patient still has left hemiparesis and does not feel like she would be very safe driving. She has had no falls or trauma on the left side. She has followed up with neurosurgery and was complaining of tingling and numbness in the left hand.  An EMG/NCV was ordered and there was mild carpal tunnel and it was unclear whether this was the cause of the hand tingling.  A left carpal tunnel injection is planned to see whether this improves patient's symptoms. Left shoulder pain recurrent , started after therapy completed on 01/25/2021 Had good relief from ultrasound guided Left shoulder injection performed on 11/27/2020 HPI  Left shoulder pain improved after last injection 08/13/21, also doing ROM exercises .   Has trigger finger Right hand 3rd digit, the patient notices this mainly in the mornings.  She has a catch in some pain when she fully extends her third digit. Entire left arm feels cold and numb, the patient states that it is really not changed much since her stroke. She is independent with all her self-care and mobility.  He continues to drive. Patient is followed up with Dr. Mickeal Skinner Pain Inventory Average Pain 6 Pain Right Now 5 My pain is dull  In the last 24 hours, has pain interfered with the following? General activity 5 Relation with others 0 Enjoyment of life 5 What TIME of day is your pain at its worst? morning  and night Sleep (in general) Fair  Pain is worse with: some activites Pain improves with: rest and medication Relief from Meds: 5  Family History  Problem Relation Age of Onset   Cancer Father         Prostate   Deep vein thrombosis Father    Social History   Socioeconomic History   Marital status: Married    Spouse name: Not on file   Number of children: Not on file   Years of education: Not on file   Highest education level: Not on file  Occupational History   Not on file  Tobacco Use   Smoking status: Former    Passive exposure: Current (Husband)   Smokeless tobacco: Never   Tobacco comments:    quit 57yr ago  Vaping Use   Vaping Use: Never used  Substance and Sexual Activity   Alcohol use: No   Drug use: No   Sexual activity: Not Currently    Birth control/protection: Surgical  Other Topics Concern   Not on file  Social History Narrative   Not on file   Social Determinants of Health   Financial Resource Strain: Not on file  Food Insecurity: Not on file  Transportation Needs: Not on file  Physical Activity: Not on file  Stress: Not on file  Social Connections: Not on file   Past Surgical History:  Procedure Laterality Date   ABDOMINAL HYSTERECTOMY  20093  APPLICATION OF CRANIAL NAVIGATION N/A 04/16/2020   Procedure: [place]  Surgeon: NConsuella Lose MD;  [place]  Service: Neurosurgery;  Laterality: N/A;   COLONOSCOPY     CRANIOTOMY Right 09/10/2013   Procedure: Craniotomy  for tumor excision;  Surgeon: Consuella Lose, MD;  Location: [place] NEUR[facility] ORS;  Service: Neurosurgery;  Laterality: Right;  Craniotomy for meningioma with stealth   CRANIOTOMY N/A 04/16/2020   Procedure: STEREOTACTIC CRANIOTOMY FOR RESECTION OF MENINGIOMA;  Surgeon: Consuella Lose, MD;  Location: [place];  Service: Neurosurgery;  Laterality: N/A;   DILATION AND CURETTAGE OF UTERUS  1980   left hand surgery  2007 and 2008   plates and screws   Past Surgical History:  Procedure Laterality Date   ABDOMINAL HYSTERECTOMY  3664   APPLICATION OF CRANIAL NAVIGATION N/A 04/16/2020   Procedure: APPLICATION OF CRANIAL NAVIGATION;  Surgeon: Consuella Lose, MD;  Location: [place];  Service: Neurosurgery;  Laterality: N/A;   COLONOSCOPY     CRANIOTOMY Right 09/10/2013   Procedure: Craniotomy for tumor excision;  Surgeon: Consuella Lose, MD;  Location: [place] NEURO ORS;  Service: Neurosurgery;  Laterality: Right;  Craniotomy for meningioma with stealth   CRANIOTOMY N/A 04/16/2020   Procedure: STEREOTACTIC CRANIOTOMY FOR RESECTION OF MENINGIOMA;  Surgeon: Consuella Lose, MD;  Location: [place];  Service: Neurosurgery;  Laterality: N/A;   DILATION AND CURETTAGE OF UTERUS  1980   left hand surgery  2007 and 2008   plates and screws   Past Medical History:  Diagnosis Date   Bruises easily    Carotid artery occlusion    Dizziness    Headache(784.0)    Hyperlipidemia    takes Atorvastatin daily   PONV (postoperative nausea and vomiting)    Stroke ([facility])    TIA (transient ischemic attack)    BP 138/87   Pulse 81   Ht '5\' 4"'$  (1.626 m)   Wt 156 lb (70.8 kg)   SpO2 98%   BMI 26.78 kg/m   Opioid Risk Score:   Fall Risk Score:  `1  Depression screen [facility] 2/9     04/29/2022   10:10 AM 03/26/2021   11:05 AM 01/01/2021   11:22 AM 11/27/2020   11:16 AM 10/16/2020    1:05 PM 07/17/2020    2:10 PM 06/05/2020    1:28 PM  Depression screen PHQ 2/9  Decreased Interest 0 0 0 0 1 0 0  Down, Depressed, Hopeless 0 0 0 0 1 0 0  PHQ - 2 Score 0 0 0 0 2 0 0  Altered sleeping       0  Tired, decreased energy       0  Change in appetite       0  Feeling bad or failure about yourself        0  Trouble concentrating       0  Moving slowly or fidgety/restless       0  Suicidal thoughts       0  PHQ-9 Score       0      Review of Systems  Musculoskeletal:        Right hand pain  Neurological:  Positive for weakness.  All other systems reviewed and are negative.     Objective:   Physical Exam Vitals and nursing note reviewed.  Constitutional:      Appearance: She is normal weight.  HENT:     Head: Normocephalic and atraumatic.  Eyes:      Extraocular Movements: Extraocular movements intact.     Conjunctiva/sclera: Conjunctivae normal.     Pupils: Pupils are equal, round, and reactive to light.  Musculoskeletal:        General: No  swelling or tenderness.     Right lower leg: No edema.     Left lower leg: No edema.  Skin:    General: Skin is warm and dry.  Neurological:     Mental Status: She is alert and oriented to person, place, and time.     Comments: Motor strength is 4/5 in the left deltoid by stress of grip hip flexor knee extensor ankle dorsiflexor 5/5 in the right deltoid bicep tricep grip hip flexor knee extensor ankle dorsiflexor Sensation intact light touch as well as pinprick in bilateral upper limbs.   Psychiatric:        Mood and Affect: Mood normal.        Behavior: Behavior normal.           Assessment & Plan:   1.  History of meningioma recurrence with chronic left hemiparesis and left hemisensory deficits.  Has made good functional improvements she will continue to follow-up with neuro oncology 2.  Left frozen shoulder improved with range of motion exercises and injections no need for further injection at this time 3.  Trigger finger right third digit.  We discussed the pathophysiology we discussed using Voltaren gel 3-4 times per day over that area.  We also discussed the possibility of trigger finger injection in 3 months if no better

## 2022-05-04 ENCOUNTER — Telehealth: Payer: Self-pay

## 2022-05-04 ENCOUNTER — Ambulatory Visit (INDEPENDENT_AMBULATORY_CARE_PROVIDER_SITE_OTHER): Payer: BC Managed Care – PPO | Admitting: Internal Medicine

## 2022-05-04 ENCOUNTER — Encounter: Payer: Self-pay | Admitting: Internal Medicine

## 2022-05-04 VITALS — BP 147/76 | HR 89 | Temp 98.0°F | Ht 64.0 in | Wt 159.0 lb

## 2022-05-04 DIAGNOSIS — R140 Abdominal distension (gaseous): Secondary | ICD-10-CM | POA: Diagnosis not present

## 2022-05-04 DIAGNOSIS — K219 Gastro-esophageal reflux disease without esophagitis: Secondary | ICD-10-CM | POA: Diagnosis not present

## 2022-05-04 DIAGNOSIS — R110 Nausea: Secondary | ICD-10-CM

## 2022-05-04 DIAGNOSIS — Z1212 Encounter for screening for malignant neoplasm of rectum: Secondary | ICD-10-CM

## 2022-05-04 DIAGNOSIS — Z1211 Encounter for screening for malignant neoplasm of colon: Secondary | ICD-10-CM

## 2022-05-04 MED ORDER — PANTOPRAZOLE SODIUM 40 MG PO TBEC: 40.0000 mg | DELAYED_RELEASE_TABLET | Freq: Every day | ORAL | 3 refills | Status: DC

## 2022-05-04 NOTE — Telephone Encounter (Signed)
PA done on [employer] for Pantoprazole 40 mg tabs. Dx: K21.9 [name] [name]), and R14.0. Pt has tried and failed Omeprazole. Waiting for a response from [employer].

## 2022-05-04 NOTE — Progress Notes (Signed)
Primary Care Physician:  Curlene Labrum, MD Primary Gastroenterologist:  Dr. Abbey Chatters  Chief Complaint  Patient presents with   Nausea    Patient referred by Dr. Pleas Koch for nausea. States she is doing better since starting protonix daily.     HPI:   [name] [name] is a 66 y.o. female who presents to the clinic today by referral from her PCP Dr. Pleas Koch for evaluation.  Patient states for multiple months she has had worsening nausea, intermittent in nature.  Also with associated abdominal bloating and acid reflux.  States her symptoms are worse after she eats.  Was started on Protonix and her symptoms vastly improved though she was only given a few short courses of this.  Her symptoms then returned soon after stopping this medication.  No dysphagia odynophagia.  No melena hematochezia.  No chronic NSAID use.  No previous upper endoscopy.  In regards to colon cancer screening, she states she had a Cologuard done 3 years ago and she is due for repeat today.  States she had colonoscopy many years ago which was WNL.  No family history of colorectal malignancy.  Past Medical History:  Diagnosis Date   Bruises easily    Carotid artery occlusion    Dizziness    Headache(784.0)    Hyperlipidemia    takes Atorvastatin daily   PONV (postoperative nausea and vomiting)    Stroke (HCC)    TIA (transient ischemic attack)     Past Surgical History:  Procedure Laterality Date   ABDOMINAL HYSTERECTOMY  2878   APPLICATION OF CRANIAL NAVIGATION N/A 04/16/2020   Procedure: APPLICATION OF CRANIAL NAVIGATION;  Surgeon: Consuella Lose, MD;  Location: [place] [facility];  Service: Neurosurgery;  Laterality: N/A;   COLONOSCOPY     CRANIOTOMY Right 09/10/2013   Procedure: Craniotomy for tumor excision;  Surgeon: Consuella Lose, MD;  Location: [facility];  Service: Neurosurgery;  Laterality: Right;  Craniotomy for meningioma with stealth   CRANIOTOMY N/A 04/16/2020   Procedure: STEREOTACTIC CRANIOTOMY FOR  RESECTION OF MENINGIOMA;  Surgeon: Consuella Lose, MD;  Location: [place];  Service: Neurosurgery;  Laterality: N/A;   DILATION AND CURETTAGE OF UTERUS  1980   left hand surgery  2007 and 2008   plates and screws    Current Outpatient Medications  Medication Sig Dispense Refill   acetaminophen (TYLENOL) 325 MG tablet Take 650 mg by mouth every 6 (six) hours as needed for moderate pain or headache.     aspirin 81 MG chewable tablet Chew 1 tablet (81 mg total) by mouth daily.     atorvastatin (LIPITOR) 40 MG tablet Take 1 tablet (40 mg total) by mouth daily. 30 tablet 0   Cholecalciferol (VITAMIN D3) 50 MCG (2000 UT) TABS Take 1 tablet by mouth daily. 30 tablet 0   levETIRAcetam (KEPPRA) 250 MG tablet Take 1 tablet (250 mg total) by mouth 2 (two) times daily. 180 tablet 3   metoprolol succinate (TOPROL-XL) 25 MG 24 hr tablet Take 0.5 tablets (12.5 mg total) by mouth daily. 30 tablet 0   OVER THE COUNTER MEDICATION Take 1 tablet by mouth daily. Vit B Stress Tablet     pantoprazole (PROTONIX) 40 MG tablet Take 40 mg by mouth daily.     potassium chloride SA (K-DUR,KLOR-CON) 20 MEQ tablet Take 20 mEq by mouth every other day.     senna (SENOKOT) 8.6 MG TABS tablet Take 1 tablet (8.6 mg total) by mouth at bedtime. 120 tablet 0  No current facility-administered medications for this visit.    Allergies as of 05/04/2022 - Review Complete 05/04/2022  Allergen Reaction Noted   Adhesive [tape]  05/19/2020    Family History  Problem Relation Age of Onset   Cancer Father        Prostate   Deep vein thrombosis Father     Social History   Socioeconomic History   Marital status: Married    Spouse name: Not on file   Number of children: Not on file   Years of education: Not on file   Highest education level: Not on file  Occupational History   Not on file  Tobacco Use   Smoking status: Former    Passive exposure: Current (Husband)   Smokeless tobacco: Never   Tobacco comments:     quit 62yr ago  Vaping Use   Vaping Use: Never used  Substance and Sexual Activity   Alcohol use: No   Drug use: No   Sexual activity: Not Currently    Birth control/protection: Surgical  Other Topics Concern   Not on file  Social History Narrative   Not on file   Social Determinants of Health   Financial Resource Strain: Not on file  Food Insecurity: Not on file  Transportation Needs: Not on file  Physical Activity: Not on file  Stress: Not on file  Social Connections: Not on file  Intimate Partner Violence: Not on file    Subjective: Review of Systems  Constitutional:  Negative for chills and fever.  HENT:  Negative for congestion and hearing loss.   Eyes:  Negative for blurred vision and double vision.  Respiratory:  Negative for cough and shortness of breath.   Cardiovascular:  Negative for chest pain and palpitations.  Gastrointestinal:  Positive for heartburn and nausea. Negative for abdominal pain, blood in stool, constipation, diarrhea, melena and vomiting.  Genitourinary:  Negative for dysuria and urgency.  Musculoskeletal:  Negative for joint pain and myalgias.  Skin:  Negative for itching and rash.  Neurological:  Negative for dizziness and headaches.  Psychiatric/Behavioral:  Negative for depression. The patient is not nervous/anxious.        Objective: BP (!) 147/76 (BP Location: Right Arm, Patient Position: Sitting, Cuff Size: Normal)   Pulse 89   Temp 98 F (36.7 C) (Oral)   Ht '5\' 4"'$  (1.626 m)   Wt 159 lb (72.1 kg)   BMI 27.29 kg/m  Physical Exam Constitutional:      Appearance: Normal appearance.  HENT:     Head: Normocephalic and atraumatic.  Eyes:     Extraocular Movements: Extraocular movements intact.     Conjunctiva/sclera: Conjunctivae normal.  Cardiovascular:     Rate and Rhythm: Normal rate and regular rhythm.  Pulmonary:     Effort: Pulmonary effort is normal.     Breath sounds: Normal breath sounds.  Abdominal:     General:  Bowel sounds are normal.     Palpations: Abdomen is soft.  Musculoskeletal:        General: No swelling. Normal range of motion.     Cervical back: Normal range of motion and neck supple.  Skin:    General: Skin is warm and dry.     Coloration: Skin is not jaundiced.  Neurological:     General: No focal deficit present.     Mental Status: She is alert and oriented to person, place, and time.  Psychiatric:        Mood and Affect:  Mood normal.        Behavior: Behavior normal.      Assessment: *Nausea  *GERD-chronic  *Abdominal bloating  *Colon cancer screening  Plan: Patient's symptoms vastly improved on pantoprazole.  I will send in year supply today.  No alarm symptoms today to warrant further investigation with upper endoscopy.  I  will also print out home practices to decrease reflux symptoms.  In regards to colon cancer screening, discussed different options.  She would like to repeat Cologuard which I will order today.  Counseled she will need to undergo colonoscopy if positive and she understands.  Follow-up in 6 months or sooner if needed.  Thank you Dr. Pleas Koch for the kind referral  05/04/2022 3:13 PM   Disclaimer: This note was dictated with voice recognition software. Similar sounding words can inadvertently be transcribed and may not be corrected upon review.

## 2022-05-04 NOTE — Patient Instructions (Signed)
For your chronic reflux, nausea, epigastric discomfort, I will send in a year supply of Protonix to take once daily.  I will also order Cologuard testing to be sent to your house for colon cancer screening purposes.  If this is positive, you will need colonoscopy.  Otherwise follow-up in 6 months or sooner if needed.  It was very nice meeting you today.  Dr. Abbey Chatters  Lifestyle and home remedies TO MANAGE REFLUX/HEARTBURN    You may eliminate or reduce the frequency of heartburn by making the following lifestyle changes:   Control your weight. Being overweight is a major risk factor for heartburn and GERD. Excess pounds put pressure on your abdomen, pushing up your stomach and causing acid to back up into your esophagus.    Eat smaller meals. 4 TO 6 MEALS A DAY. This reduces pressure on the lower esophageal sphincter, helping to prevent the valve from opening and acid from washing back into your esophagus.     Loosen your belt. Clothes that fit tightly around your waist put pressure on your abdomen and the lower esophageal sphincter.     Eliminate heartburn triggers. Everyone has specific triggers. Common triggers such as fatty or fried foods, spicy food, tomato sauce, carbonated beverages, alcohol, chocolate, mint, garlic, onion, caffeine and nicotine may make heartburn worse.    Avoid stooping or bending. Tying your shoes is OK. Bending over for longer periods to weed your garden isn't, especially soon after eating.    Don't lie down after a meal. Wait at least three to four hours after eating before going to bed, and don't lie down right after eating.   At [facility] we value your feedback. You may receive a survey about your visit today. Please share your experience as we strive to create trusting relationships with our patients to provide genuine, compassionate, quality care.  We appreciate your understanding and patience as we review any laboratory studies,  imaging, and other diagnostic tests that are ordered as we care for you. Our office policy is 5 business days for review of these results, and any emergent or urgent results are addressed in a timely manner for your best interest. If you do not hear from our office in 1 week, please contact us.   We also encourage the use of MyChart, which contains your medical information for your review as well. If you are not enrolled in this feature, an access code is on this after visit summary for your convenience. Thank you for allowing [name] to be involved in your care.  It was great to see you today!  I hope you have a great rest of your Spring!    Elon Alas. Abbey Chatters, D.O. Gastroenterology and Hepatology [facility]

## 2022-05-11 DIAGNOSIS — M24541 Contracture, right hand: Secondary | ICD-10-CM | POA: Diagnosis not present

## 2022-05-11 NOTE — Telephone Encounter (Signed)
Pt's insurance denied Pantoprazole. Pt nedds to try Lansoprazole or Esomeprazole which ae OTC. According to notes she has to try first. Please advise

## 2022-05-12 ENCOUNTER — Other Ambulatory Visit: Payer: Self-pay | Admitting: Internal Medicine

## 2022-05-12 DIAGNOSIS — Z1211 Encounter for screening for malignant neoplasm of colon: Secondary | ICD-10-CM | POA: Diagnosis not present

## 2022-05-12 DIAGNOSIS — Z1212 Encounter for screening for malignant neoplasm of rectum: Secondary | ICD-10-CM | POA: Diagnosis not present

## 2022-05-12 MED ORDER — LANSOPRAZOLE 30 MG PO CPDR: 30.0000 mg | DELAYED_RELEASE_CAPSULE | Freq: Every day | ORAL | 3 refills | Status: DC

## 2022-05-12 NOTE — Telephone Encounter (Signed)
Lansoprazole 30 mg daily sent to pharmacy. If this still isnt covered then have Patient take OTC esomeprazole 20 mg daily and see how she does. Thank you

## 2022-05-13 NOTE — Telephone Encounter (Signed)
FYI:  Phoned and spoke to the pt and she advised me that she went ahead and paid out of pocket $25.00 for the Pantoprazole because she prefers that Rx instead. No need for Lansoprazole Rx. She will pay for Pantoprazole refills

## 2022-05-20 LAB — COLOGUARD: COLOGUARD: NEGATIVE

## 2022-07-22 DIAGNOSIS — E876 Hypokalemia: Secondary | ICD-10-CM | POA: Diagnosis not present

## 2022-07-22 DIAGNOSIS — E7849 Other hyperlipidemia: Secondary | ICD-10-CM | POA: Diagnosis not present

## 2022-07-22 DIAGNOSIS — E559 Vitamin D deficiency, unspecified: Secondary | ICD-10-CM | POA: Diagnosis not present

## 2022-07-22 DIAGNOSIS — R7301 Impaired fasting glucose: Secondary | ICD-10-CM | POA: Diagnosis not present

## 2022-07-25 ENCOUNTER — Other Ambulatory Visit: Payer: Self-pay | Admitting: Neurosurgery

## 2022-07-25 DIAGNOSIS — D329 Benign neoplasm of meninges, unspecified: Secondary | ICD-10-CM

## 2022-08-02 DIAGNOSIS — R569 Unspecified convulsions: Secondary | ICD-10-CM | POA: Diagnosis not present

## 2022-08-02 DIAGNOSIS — G8194 Hemiplegia, unspecified affecting left nondominant side: Secondary | ICD-10-CM | POA: Diagnosis not present

## 2022-08-02 DIAGNOSIS — Z0001 Encounter for general adult medical examination with abnormal findings: Secondary | ICD-10-CM | POA: Diagnosis not present

## 2022-08-02 DIAGNOSIS — R7301 Impaired fasting glucose: Secondary | ICD-10-CM | POA: Diagnosis not present

## 2022-08-02 DIAGNOSIS — D320 Benign neoplasm of cerebral meninges: Secondary | ICD-10-CM | POA: Diagnosis not present

## 2022-08-02 DIAGNOSIS — Z23 Encounter for immunization: Secondary | ICD-10-CM | POA: Diagnosis not present

## 2022-08-02 DIAGNOSIS — R030 Elevated blood-pressure reading, without diagnosis of hypertension: Secondary | ICD-10-CM | POA: Diagnosis not present

## 2022-08-05 ENCOUNTER — Encounter: Payer: Self-pay | Admitting: Physical Medicine & Rehabilitation

## 2022-08-05 ENCOUNTER — Encounter
Payer: BC Managed Care – PPO | Attending: Physical Medicine & Rehabilitation | Admitting: Physical Medicine & Rehabilitation

## 2022-08-05 VITALS — BP 123/76 | HR 86 | Ht 64.0 in | Wt 157.8 lb

## 2022-08-05 DIAGNOSIS — M65841 Other synovitis and tenosynovitis, right hand: Secondary | ICD-10-CM | POA: Insufficient documentation

## 2022-08-05 NOTE — Progress Notes (Signed)
Subjective:    Patient ID: [name] [name], female    DOB: 1956-03-28, 67 y.o.   MRN: [id number]  HPI 66 year old female with history of recurrent right frontal meningioma who returns today for follow-up of left-sided weakness as well as right hand pain. Still with numbness and tingling LUE, off gabapentin due to dizziness, does not feel like she uses her left hand very often. No left shoulder pain, still mildly limited range of motion.  Patient is otherwise independent.  She is back to driving with the okay from neuro oncology.  Seizures are well controlled with Keppra RIght 3rd digit trigger finger still bothering her.  She does not tolerate the night splint.  We discussed that given her limited use of the left upper extremity would not recommend splint use of the right upper extremity during the day.  Pain Inventory Average Pain 8 Pain Right Now 7 My pain is burning and aching  In the last 24 hours, has pain interfered with the following? General activity 8 Relation with others 0 Enjoyment of life 8 What TIME of day is your pain at its worst? morning  and night Sleep (in general) Poor  Pain is worse with: some activites Pain improves with: medication Relief from Meds: 5  Family History  Problem Relation Age of Onset   Cancer Father        Prostate   Deep vein thrombosis Father    Social History   Socioeconomic History   Marital status: Married    Spouse name: Not on file   Number of children: Not on file   Years of education: Not on file   Highest education level: Not on file  Occupational History   Not on file  Tobacco Use   Smoking status: Former    Passive exposure: Current (Husband)   Smokeless tobacco: Never   Tobacco comments:    quit 83yr ago  Vaping Use   Vaping Use: Never used  Substance and Sexual Activity   Alcohol use: No   Drug use: No   Sexual activity: Not Currently    Birth control/protection: Surgical  Other Topics Concern   Not on file   Social History Narrative   Not on file   Social Determinants of Health   Financial Resource Strain: Not on file  Food Insecurity: Not on file  Transportation Needs: Not on file  Physical Activity: Not on file  Stress: Not on file  Social Connections: Not on file   Past Surgical History:  Procedure Laterality Date   ABDOMINAL HYSTERECTOMY  24174  APPLICATION OF CRANIAL NAVIGATION N/A 04/16/2020   Procedure: [place]  Surgeon: NConsuella Lose MD;  [place]  Service: Neurosurgery;  Laterality: N/A;   COLONOSCOPY     CRANIOTOMY Right 09/10/2013   Procedure: Craniotomy for tumor excision;  Surgeon: NConsuella Lose MD;  [facility];  Service: Neurosurgery;  Laterality: Right;  Craniotomy for meningioma with stealth   CRANIOTOMY N/A 04/16/2020   Procedure: STEREOTACTIC CRANIOTOMY FOR RESECTION OF MENINGIOMA;  Surgeon: NConsuella Lose MD;  [place]  Service: Neurosurgery;  Laterality: N/A;   DILATION AND CURETTAGE OF UTERUS  1980   left hand surgery  2007 and 2008   plates and screws   Past Surgical History:  Procedure Laterality Date   ABDOMINAL HYSTERECTOMY  20814  APPLICATION OF CRANIAL NAVIGATION N/A 04/16/2020   Procedure: APPLICATION OF CRANIAL NAVIGATION;  Surgeon: NConsuella Lose MD;  [place]  Service: Neurosurgery;  Laterality: N/A;   COLONOSCOPY     CRANIOTOMY Right 09/10/2013   Procedure: Craniotomy for tumor excision;  Surgeon: Consuella Lose, MD;  Location: [place] [facility];  Service: Neurosurgery;  Laterality: Right;  Craniotomy for meningioma with stealth   CRANIOTOMY N/A 04/16/2020   Procedure: STEREOTACTIC CRANIOTOMY FOR RESECTION OF MENINGIOMA;  Surgeon: Consuella Lose, MD;  Location: [place];  Service: Neurosurgery;  Laterality: N/A;   DILATION AND CURETTAGE OF UTERUS  1980   left hand surgery  2007 and 2008   plates and screws   Past Medical History:  Diagnosis Date   Bruises easily     Carotid artery occlusion    Dizziness    Headache(784.0)    Hyperlipidemia    takes Atorvastatin daily   PONV (postoperative nausea and vomiting)    Stroke (HCC)    TIA (transient ischemic attack)    BP 123/76   Pulse 86   Ht '5\' 4"'$  (1.626 m)   Wt 157 lb 12.8 oz (71.6 kg)   SpO2 97%   BMI 27.09 kg/m   Opioid Risk Score:   Fall Risk Score:  `1  Depression screen [facility] 2/9     04/29/2022   10:10 AM 03/26/2021   11:05 AM 01/01/2021   11:22 AM 11/27/2020   11:16 AM 10/16/2020    1:05 PM 07/17/2020    2:10 PM 06/05/2020    1:28 PM  Depression screen PHQ 2/9  Decreased Interest 0 0 0 0 1 0 0  Down, Depressed, Hopeless 0 0 0 0 1 0 0  PHQ - 2 Score 0 0 0 0 2 0 0  Altered sleeping       0  Tired, decreased energy       0  Change in appetite       0  Feeling bad or failure about yourself        0  Trouble concentrating       0  Moving slowly or fidgety/restless       0  Suicidal thoughts       0  PHQ-9 Score       0     Review of Systems  Musculoskeletal:        Right hand pain Left arm pain  Neurological:  Positive for weakness and numbness.  All other systems reviewed and are negative.     Objective:   Physical Exam  Well-developed well-nourished female in no acute distress Mood and affect are appropriate Left shoulder has 90% of normal range of motion with forward flexion and abduction. Motor strength is 4+ in left deltoid bicep tricep grip hip flexor knee extensor ankle dorsiflexor within available range. Fine motor left upper extremity slowed finger thumb opposition with positive dysdiadochokinesis with rapid alternating supination pronation left upper extremity. There is no pain with external rotation left shoulder There is triggering of the left middle finger but not of the third or second digits.  No puckering noted at the distal palm.  The patient does have sensation light touch although she states that altered compared to the right side    Assessment & Plan:    1.  History of recurrent frontal meningioma, excellent functional recovery still has fine motor deficits as well as mild sensory deficits.  Encourage functional usage of left upper extremity Follow-up with neuro-oncology 2.  Adhesive capsulitis left shoulder essentially resolved still some mild range of motion deficits 3.  Stenosing tenosynovitis right third digit schedule  for ultrasound guided A1 pulley injection given failure of conservative care including splinting stretching and Voltaren gel

## 2022-08-05 NOTE — Patient Instructions (Signed)
Trigger Finger  Trigger finger, also called stenosing tenosynovitis,  is a condition that causes a finger to get stuck in a bent position. Each finger has a tendon, which is a tough, cord-like tissue that connects muscle to bone, and each tendon passes through a tunnel of tissue called a tendon sheath. To move your finger, your tendon needs to glide freely through the sheath. Trigger finger happens when the tendon or the sheath thickens, making it difficult to move your finger. Trigger finger can affect any finger or a thumb. It may affect more than one finger. Mild cases may clear up with rest and medicine. Severe cases require more treatment. What are the causes? Trigger finger is caused by a thickened finger tendon or tendon sheath. The cause of this thickening is not known. What increases the risk? The following factors may make you more likely to develop this condition: Doing activities that require a strong grip. Having rheumatoid arthritis, gout, or diabetes. Being 40-60 years old. Being female. What are the signs or symptoms? Symptoms of this condition include: Pain when bending or straightening your finger. Tenderness or swelling where your finger attaches to the palm of your hand. A lump in the palm of your hand or on the inside of your finger. Hearing a noise like a pop or a snap when you try to straighten your finger. Feeling a catching or locking sensation when you try to straighten your finger. Being unable to straighten your finger. How is this diagnosed? This condition is diagnosed based on your symptoms and a physical exam. How is this treated? This condition may be treated by: Resting your finger and avoiding activities that make symptoms worse. Wearing a finger splint to keep your finger extended. Taking NSAIDs, such as ibuprofen, to relieve pain and swelling. Doing gentle exercises to stretch the finger as told by your health care provider. Having medicine that reduces  swelling and inflammation (steroids) injected into the tendon sheath. Injections may need to be repeated. Having surgery to open the tendon sheath. This may be done if other treatments do not work and you cannot straighten your finger. You may need physical therapy after surgery. Follow these instructions at home: If you have a splint: Wear the splint as told by your health care provider. Remove it only as told by your health care provider. Loosen it if your fingers tingle, become numb, or turn cold and blue. Keep it clean. If the splint is not waterproof: Do not let it get wet. Cover it with a watertight covering when you take a bath or shower. Managing pain, stiffness, and swelling     If directed, apply heat to the affected area as often as told by your health care provider. Use the heat source that your health care provider recommends, such as a moist heat pack or a heating pad. Place a towel between your skin and the heat source. Leave the heat on for 20-30 minutes. Remove the heat if your skin turns bright red. This is especially important if you are unable to feel pain, heat, or cold. You may have a greater risk of getting burned. If directed, put ice on the painful area. To do this: If you have a removable splint, remove it as told by your health care provider. Put ice in a plastic bag. Place a towel between your skin and the bag or between your splint and the bag. Leave the ice on for 20 minutes, 2-3 times a day.  Activity Rest   your finger as told by your health care provider. Avoid activities that make the pain worse. Return to your normal activities as told by your health care provider. Ask your health care provider what activities are safe for you. Do exercises as told by your health care provider. Ask your health care provider when it is safe to drive if you have a splint on your hand. General instructions Take over-the-counter and prescription medicines only as told by  your health care provider. Keep all follow-up visits as told by your health care provider. This is important. Contact a health care provider if: Your symptoms are not improving with home care. Summary Trigger finger, also called stenosing tenosynovitis, causes your finger to get stuck in a bent position. This can make it difficult and painful to straighten your finger. This condition develops when a finger tendon or tendon sheath thickens. Treatment may include resting your finger, wearing a splint, and taking medicines. In severe cases, surgery to open the tendon sheath may be needed. This information is not intended to replace advice given to you by your health care provider. Make sure you discuss any questions you have with your health care provider. Document Revised: 03/18/2019 Document Reviewed: 03/18/2019 Elsevier Patient Education  2023 [employer]  

## 2022-08-19 ENCOUNTER — Ambulatory Visit
Admission: RE | Admit: 2022-08-19 | Discharge: 2022-08-19 | Disposition: A | Discharge status: Home / Self Care | Payer: BC Managed Care – PPO | Source: Ambulatory Visit | Attending: Neurosurgery | Admitting: Neurosurgery

## 2022-08-19 DIAGNOSIS — D320 Benign neoplasm of cerebral meninges: Secondary | ICD-10-CM | POA: Diagnosis not present

## 2022-08-19 DIAGNOSIS — D329 Benign neoplasm of meninges, unspecified: Secondary | ICD-10-CM

## 2022-08-19 MED ORDER — GADOPICLENOL 0.5 MMOL/ML IV SOLN
10.0000 mL | Freq: Once | INTRAVENOUS | Status: AC | PRN
  Administered 2022-08-19: 7 mL via INTRAVENOUS

## 2022-09-08 DIAGNOSIS — D329 Benign neoplasm of meninges, unspecified: Secondary | ICD-10-CM | POA: Diagnosis not present

## 2022-09-08 DIAGNOSIS — Z6827 Body mass index (BMI) 27.0-27.9, adult: Secondary | ICD-10-CM | POA: Diagnosis not present

## 2022-09-16 ENCOUNTER — Encounter: Payer: Self-pay | Admitting: Physical Medicine & Rehabilitation

## 2022-09-16 ENCOUNTER — Encounter: Payer: Medicare Other | Attending: Physical Medicine & Rehabilitation | Admitting: Physical Medicine & Rehabilitation

## 2022-09-16 VITALS — BP 145/79 | HR 85 | Ht 64.0 in | Wt 157.0 lb

## 2022-09-16 DIAGNOSIS — M65841 Other synovitis and tenosynovitis, right hand: Secondary | ICD-10-CM | POA: Diagnosis not present

## 2022-09-16 MED ORDER — BETAMETHASONE SOD PHOS & ACET 6 (3-3) MG/ML IJ SUSP
3.0000 mg | Freq: Once | INTRAMUSCULAR | Status: AC
  Administered 2022-09-16: 3 mg via INTRA_ARTICULAR

## 2022-09-16 MED ORDER — LIDOCAINE HCL 1 % IJ SOLN
0.2500 mL | Freq: Once | INTRAMUSCULAR | Status: AC
  Administered 2022-09-16: 0.25 mL

## 2022-09-16 NOTE — Progress Notes (Addendum)
Diagnosis stenosing tenosynovitis right third A1 pulley  Procedure right third A1 pulley injection under ultrasound guidance  Written informed consent was obtained  15 Hz linear transducer utilized.  Cross-sectional view of flexor tendon and sheath with needle inserted along the long axis.  Betadine prep to the area right third metacarpal phalangeal joint identified and marked . 1% lidocaine x1/4 cc injected into the skin and subcu tissues. Then a separate 30-gauge half inch needle was inserted along the long axis under [place] ultrasound visualization into the area just deep to the flexor sheath and superior to the flexor tendon.  1/4 mL of 6 mg/mL Celestone was injected. Patient tolerated procedure well Postinjection instructions given  F/u 6 wks

## 2022-09-22 ENCOUNTER — Encounter: Payer: Self-pay | Admitting: Internal Medicine

## 2022-10-28 ENCOUNTER — Encounter: Payer: Medicare Other | Admitting: Physical Medicine & Rehabilitation

## 2023-01-12 ENCOUNTER — Ambulatory Visit (HOSPITAL_COMMUNITY)
Admission: RE | Admit: 2023-01-12 | Discharge: 2023-01-12 | Disposition: A | Discharge status: Home / Self Care | Payer: Medicare Other | Source: Ambulatory Visit | Attending: Vascular Surgery | Admitting: Vascular Surgery

## 2023-01-12 ENCOUNTER — Ambulatory Visit: Payer: Medicare Other | Admitting: Physician Assistant

## 2023-01-12 ENCOUNTER — Encounter: Payer: Self-pay | Admitting: Physician Assistant

## 2023-01-12 VITALS — BP 132/80 | HR 71 | Temp 97.7°F | Resp 20 | Ht 64.0 in | Wt 157.2 lb

## 2023-01-12 DIAGNOSIS — I773 Arterial fibromuscular dysplasia: Secondary | ICD-10-CM | POA: Diagnosis not present

## 2023-01-12 DIAGNOSIS — I6529 Occlusion and stenosis of unspecified carotid artery: Secondary | ICD-10-CM

## 2023-01-12 NOTE — Progress Notes (Signed)
Office Note     CC:  follow up Requesting Provider:  Curlene Labrum, MD  HPI: [name] [name] is a 66 y.o. (1956-08-09) female who presents for surveillance of carotid artery stenosis.  She has known fibromuscular dysplasia affecting the carotids bilaterally.  Surgical history is significant for right frontal craniotomy by Dr. Kathyrn Sheriff for a resection of a meningioma in October 2014.  She then required a repeat craniotomy to resect multiple tumors in June 2021.  Postoperatively she experienced left facial weakness, and left arm and leg weakness.  She states the left-sided weakness has been stable.  She has not been diagnosed with a TIA or CVA since last office visit.  She has not had any strokelike symptoms including slurring speech or changes in vision, or worsening weakness of the left side.  She is on aspirin and statin daily.  She follows regularly with a neurologist and her neurosurgeon with an MRI every 6 months.  She is on Keppra for seizure suppression.  Despite left-sided weakness she does not walk with a cane or a walker   Past Medical History:  Diagnosis Date   Bruises easily    Carotid artery occlusion    Dizziness    Headache(784.0)    Hyperlipidemia    takes Atorvastatin daily   PONV (postoperative nausea and vomiting)    Stroke (HCC)    TIA (transient ischemic attack)     Past Surgical History:  Procedure Laterality Date   ABDOMINAL HYSTERECTOMY  [id number]   APPLICATION OF CRANIAL NAVIGATION N/A 04/16/2020   Procedure: APPLICATION OF CRANIAL NAVIGATION;  Surgeon: Consuella Lose, MD;  Location: [facility];  Service: Neurosurgery;  Laterality: N/A;   COLONOSCOPY     CRANIOTOMY Right 09/10/2013   Procedure: Craniotomy for tumor excision;  Surgeon: Consuella Lose, MD;  Location: [facility];  Service: Neurosurgery;  Laterality: Right;  Craniotomy for meningioma with stealth   CRANIOTOMY N/A 04/16/2020   Procedure: STEREOTACTIC CRANIOTOMY FOR RESECTION OF MENINGIOMA;   Surgeon: Consuella Lose, MD;  Location: [facility];  Service: Neurosurgery;  Laterality: N/A;   DILATION AND CURETTAGE OF UTERUS  1980   left hand surgery  2007 and 2008   plates and screws    Social History   Socioeconomic History   Marital status: Married    Spouse name: Not on file   Number of children: Not on file   Years of education: Not on file   Highest education level: Not on file  Occupational History   Not on file  Tobacco Use   Smoking status: Former    Passive exposure: Current (Husband)   Smokeless tobacco: Never   Tobacco comments:    quit 22yr ago  Vaping Use   Vaping Use: Never used  Substance and Sexual Activity   Alcohol use: No   Drug use: No   Sexual activity: Not Currently    Birth control/protection: Surgical  Other Topics Concern   Not on file  Social History Narrative   Not on file   Social Determinants of Health   Financial Resource Strain: Not on file  Food Insecurity: Not on file  Transportation Needs: Not on file  Physical Activity: Not on file  Stress: Not on file  Social Connections: Not on file  Intimate Partner Violence: Not on file    Family History  Problem Relation Age of Onset   Cancer Father        Prostate   Deep vein thrombosis Father  Current Outpatient Medications  Medication Sig Dispense Refill   acetaminophen (TYLENOL) 325 MG tablet Take 650 mg by mouth every 6 (six) hours as needed for moderate pain or headache.     aspirin 81 MG chewable tablet Chew 1 tablet (81 mg total) by mouth daily.     atorvastatin (LIPITOR) 40 MG tablet Take 1 tablet (40 mg total) by mouth daily. 30 tablet 0   Cholecalciferol (VITAMIN D3) 50 MCG (2000 UT) TABS Take 1 tablet by mouth daily. 30 tablet 0   lansoprazole (PREVACID) 30 MG capsule Take 1 capsule (30 mg total) by mouth daily at 12 noon. 90 capsule 3   levETIRAcetam (KEPPRA) 250 MG tablet Take 1 tablet (250 mg total) by mouth 2 (two) times daily. 180 tablet 3   metoprolol  succinate (TOPROL-XL) 25 MG 24 hr tablet Take 0.5 tablets (12.5 mg total) by mouth daily. 30 tablet 0   OVER THE COUNTER MEDICATION Take 1 tablet by mouth daily. Vit B Stress Tablet     pantoprazole (PROTONIX) 40 MG tablet Take 40 mg by mouth daily.     potassium chloride SA (K-DUR,KLOR-CON) 20 MEQ tablet Take 20 mEq by mouth every other day.     senna (SENOKOT) 8.6 MG TABS tablet Take 1 tablet (8.6 mg total) by mouth at bedtime. 120 tablet 0   traZODone (DESYREL) 50 MG tablet Take 50 mg by mouth at bedtime.     No current facility-administered medications for this visit.    Allergies  Allergen Reactions   Adhesive [Tape]     Rash popped up after using bandaids, telemetry stickers.     REVIEW OF SYSTEMS:   '[X]'$  denotes positive finding, '[ ]'$  denotes negative finding Cardiac  Comments:  Chest pain or chest pressure:    Shortness of breath upon exertion:    Short of breath when lying flat:    Irregular heart rhythm:        Vascular    Pain in calf, thigh, or hip brought on by ambulation:    Pain in feet at night that wakes you up from your sleep:     Blood clot in your veins:    Leg swelling:         Pulmonary    Oxygen at home:    Productive cough:     Wheezing:         Neurologic    Sudden weakness in arms or legs:     Sudden numbness in arms or legs:     Sudden onset of difficulty speaking or slurred speech:    Temporary loss of vision in one eye:     Problems with dizziness:         Gastrointestinal    Blood in stool:     Vomited blood:         Genitourinary    Burning when urinating:     Blood in urine:        Psychiatric    Major depression:         Hematologic    Bleeding problems:    Problems with blood clotting too easily:        Skin    Rashes or ulcers:        Constitutional    Fever or chills:      PHYSICAL EXAMINATION:  Vitals:   01/12/23 1026 01/12/23 1028  BP: (!) 141/82 132/80  Pulse: 71   Resp: 20   Temp: 97.7 F (36.5 C)  TempSrc: Temporal   SpO2: 97%   Weight: 157 lb 3.2 oz (71.3 kg)   Height: '5\' 4"'$  (1.626 m)     General:  WDWN in NAD; vital signs documented above Gait: Not observed HENT: WNL, normocephalic Pulmonary: normal non-labored breathing , without Rales, rhonchi,  wheezing Cardiac: regular HR Abdomen: soft, NT, no masses Skin: without rashes Vascular Exam/Pulses:  Right Left  Radial 2+ (normal) 2+ (normal)   Extremities: without ischemic changes, without Gangrene , without cellulitis; without open wounds;  Musculoskeletal: no muscle wasting or atrophy  Neurologic: A&O X 3;  L facial, arm, and leg weakness; CN otherwise grossly intact Psychiatric:  The pt has Normal affect.   Non-Invasive Vascular Imaging:   B ICA 40-59% stenosis    ASSESSMENT/PLAN:: 67 y.o. female here for follow up for surveillance of carotid artery stenosis  -Previously Ms. Rinner has measured in the 60 to 79% category for stenosis in her left ICA.  We have been unable to replicate those numbers on the prior visit as well as today's visit.  She is measuring in the 40 to 59% stenosis for bilateral internal carotid arteries.  For this reason we will follow on an annual basis for repeat carotid duplex.  She can continue her aspirin and statin daily.  She should continue to follow-up regularly with her neurologist and neurosurgeon.  She can call/return our office with any questions or concerns.   Dagoberto Ligas, PA-C [facility] [phone]  Clinic MD:   Scot Dock

## 2023-02-14 ENCOUNTER — Other Ambulatory Visit: Payer: Self-pay | Admitting: Neurosurgery

## 2023-02-14 DIAGNOSIS — D329 Benign neoplasm of meninges, unspecified: Secondary | ICD-10-CM

## 2023-03-02 ENCOUNTER — Inpatient Hospital Stay: Payer: Medicare Other | Attending: Internal Medicine | Admitting: Internal Medicine

## 2023-03-02 VITALS — BP 128/77 | HR 85 | Temp 98.4°F | Resp 16 | Wt 157.9 lb

## 2023-03-02 DIAGNOSIS — R569 Unspecified convulsions: Secondary | ICD-10-CM | POA: Insufficient documentation

## 2023-03-02 DIAGNOSIS — Z7982 Long term (current) use of aspirin: Secondary | ICD-10-CM | POA: Insufficient documentation

## 2023-03-02 DIAGNOSIS — Z79899 Other long term (current) drug therapy: Secondary | ICD-10-CM | POA: Diagnosis not present

## 2023-03-02 DIAGNOSIS — D329 Benign neoplasm of meninges, unspecified: Secondary | ICD-10-CM | POA: Insufficient documentation

## 2023-03-02 DIAGNOSIS — Z87891 Personal history of nicotine dependence: Secondary | ICD-10-CM | POA: Insufficient documentation

## 2023-03-02 MED ORDER — LEVETIRACETAM 250 MG PO TABS: 125.0000 mg | ORAL_TABLET | Freq: Two times a day (BID) | ORAL | 3 refills | Status: DC

## 2023-03-02 NOTE — Progress Notes (Signed)
[facility] [address]  [place], [place] [address] [phone]   Interval Evaluation  Date of Service: 03/02/23 Patient Name: [name] [name] Patient MRN: [id number] Patient DOB: [birth year] Provider: Henreitta Leber, MD  Identifying Statement:  [name] [name] is a 67 y.o. female with  multifocal  meningioma  Oncologic History: 09/10/13: Craniotomy, resection with Dr. Conchita Paris. 04/21/20: R frontal and falcine recurrence, repeat resection debulkeing with Nundkumar 05/22/20: SRS to residual tumor along falx and sinus with Dr. Kathrynn Running  Interval History:  [name] [name] presents today for follow up.  Denies new or progressive deficits today.  No further seizure activity.  Some ongoing discomfort around craniotomy site, unchanged.  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  Denies headaches.  Medications: Current Outpatient Medications on File Prior to Visit  Medication Sig Dispense Refill   acetaminophen (TYLENOL) 325 MG tablet Take 650 mg by mouth every 6 (six) hours as needed for moderate pain or headache.     aspirin 81 MG chewable tablet Chew 1 tablet (81 mg total) by mouth daily.     atorvastatin (LIPITOR) 40 MG tablet Take 1 tablet (40 mg total) by mouth daily. 30 tablet 0   Cholecalciferol (VITAMIN D3) 50 MCG (2000 UT) TABS Take 1 tablet by mouth daily. 30 tablet 0   metoprolol succinate (TOPROL-XL) 25 MG 24 hr tablet Take 0.5 tablets (12.5 mg total) by mouth daily. 30 tablet 0   OVER THE COUNTER MEDICATION Take 1 tablet by mouth daily. Vit B Stress Tablet     pantoprazole (PROTONIX) 40 MG tablet Take 40 mg by mouth daily.     potassium chloride SA (K-DUR,KLOR-CON) 20 MEQ tablet Take 20 mEq by mouth every other day.     lansoprazole (PREVACID) 30 MG capsule Take 1 capsule (30 mg total) by mouth daily at 12 noon. 90 capsule 3   senna (SENOKOT) 8.6 MG TABS tablet Take 1 tablet (8.6 mg total) by mouth at bedtime. (Patient  taking differently: Take 1 tablet by mouth at bedtime as needed.) 120 tablet 0   traZODone (DESYREL) 50 MG tablet Take 50 mg by mouth at bedtime.     No current facility-administered medications on file prior to visit.    Allergies:  Allergies  Allergen Reactions   Adhesive [Tape]     Rash popped up after using bandaids, telemetry stickers.   Past Medical History:  Past Medical History:  Diagnosis Date   Bruises easily    Carotid artery occlusion    Dizziness    Headache(784.0)    Hyperlipidemia    takes Atorvastatin daily   PONV (postoperative nausea and vomiting)    Stroke (HCC)    TIA (transient ischemic attack)    Past Surgical History:  Past Surgical History:  Procedure Laterality Date   ABDOMINAL HYSTERECTOMY  2002   APPLICATION OF CRANIAL NAVIGATION N/A 04/16/2020   Procedure: APPLICATION OF CRANIAL NAVIGATION;  Surgeon: Lisbeth Renshaw, MD;  Location: [facility];  Service: Neurosurgery;  Laterality: N/A;   COLONOSCOPY     CRANIOTOMY Right 09/10/2013   Procedure: Craniotomy for tumor excision;  Surgeon: Lisbeth Renshaw, MD;  Location: [facility];  Service: Neurosurgery;  Laterality: Right;  Craniotomy for meningioma with stealth   CRANIOTOMY N/A 04/16/2020   Procedure: STEREOTACTIC CRANIOTOMY FOR RESECTION OF MENINGIOMA;  Surgeon: Lisbeth Renshaw, MD;  Location: [facility];  Service: Neurosurgery;  Laterality: N/A;   DILATION AND CURETTAGE OF UTERUS  1980   left hand  surgery  2007 and 2008   plates and screws   Social History:  Social History   Socioeconomic History   Marital status: Married    Spouse name: Not on file   Number of children: Not on file   Years of education: Not on file   Highest education level: Not on file  Occupational History   Not on file  Tobacco Use   Smoking status: Former    Passive exposure: Current (Husband)   Smokeless tobacco: Never   Tobacco comments:    quit 24yrs ago  Vaping Use   Vaping Use: Never used  Substance and  Sexual Activity   Alcohol use: No   Drug use: No   Sexual activity: Not Currently    Birth control/protection: Surgical  Other Topics Concern   Not on file  Social History Narrative   Not on file   Social Determinants of Health   Financial Resource Strain: Not on file  Food Insecurity: Not on file  Transportation Needs: Not on file  Physical Activity: Not on file  Stress: Not on file  Social Connections: Not on file  Intimate Partner Violence: Not on file   Family History:  Family History  Problem Relation Age of Onset   Cancer Father        Prostate   Deep vein thrombosis Father     Review of Systems: Constitutional: Doesn't report fevers, chills or abnormal weight loss Eyes: Doesn't report blurriness of vision Ears, nose, mouth, throat, and face: Doesn't report sore throat Respiratory: Doesn't report cough, dyspnea or wheezes Cardiovascular: Doesn't report palpitation, chest discomfort  Gastrointestinal:  Doesn't report nausea, constipation, diarrhea GU: Doesn't report incontinence Skin: Doesn't report skin rashes Neurological: Per HPI Musculoskeletal: Doesn't report joint pain Behavioral/Psych: Doesn't report anxiety  Physical Exam: Vitals:   03/02/23 1020  BP: 128/77  Pulse: 85  Resp: 16  Temp: 98.4 F (36.9 C)   KPS: 70. General: Alert, cooperative, pleasant, in no acute distress Head: Normal EENT: No conjunctival injection or scleral icterus.  Lungs: Resp effort normal Cardiac: Regular rate Abdomen: Non-distended abdomen Skin: No rashes cyanosis or petechiae. Extremities: No clubbing or edema  Neurologic Exam: Mental Status: Awake, alert, attentive to examiner. Oriented to self and environment. Language is fluent with intact comprehension.  Cranial Nerves: Visual acuity is grossly normal. Visual fields are full. Extra-ocular movements intact. No ptosis. Face is symmetric Motor: Tone and bulk are normal. Power 4/5 in left arm and leg. Reflexes are  symmetric, no pathologic reflexes present.  Sensory: Intact to light touch Gait: Hemiparetic  Labs: I have reviewed the data as listed    Component Value Date/Time   NA 140 04/22/2020 0532   K 4.1 04/22/2020 0532   CL 105 04/22/2020 0532   CO2 26 04/22/2020 0532   GLUCOSE 104 (H) 04/22/2020 0532   BUN 17 04/22/2020 0532   CREATININE 0.76 04/22/2020 0532   CALCIUM 9.0 04/22/2020 0532   PROT 5.9 (L) 04/22/2020 0532   ALBUMIN 3.0 (L) 04/22/2020 0532   AST 20 04/22/2020 0532   ALT 37 04/22/2020 0532   ALKPHOS 64 04/22/2020 0532   BILITOT 0.6 04/22/2020 0532   GFRNONAA >60 04/22/2020 0532   GFRAA >60 04/22/2020 0532   Lab Results  Component Value Date   WBC 16.0 (H) 04/22/2020   NEUTROABS 8.7 (H) 04/22/2020   HGB 11.8 (L) 04/22/2020   HCT 36.8 04/22/2020   MCV 93.9 04/22/2020   PLT 291 04/22/2020  Imaging:  [facility] Clinician Interpretation: I have personally reviewed the CNS images as listed.  My interpretation, in the context of the patient's clinical presentation, is stable disease  CLINICAL DATA:  Benign neoplasm of meninges.   EXAM: MRI HEAD WITHOUT AND WITH CONTRAST   TECHNIQUE: Multiplanar, multiecho pulse sequences of the brain and surrounding structures were obtained without and with intravenous contrast.   CONTRAST:  15mL MULTIHANCE GADOBENATE DIMEGLUMINE 529 MG/ML IV SOLN   COMPARISON:  MRI of the brain August 14, 2020.   FINDINGS: Brain: Postsurgical changes from right craniotomy with unchanged area of encephalomalacia and gliosis with associated cortical laminar necrosis in the right frontal lobe.   Significant interval decrease in size of the epidural fluid collection subjacent to the area of craniotomy, now measuring up to 3 mm in thickness, compared to 8 mm on prior, now with minimal mass effect on the subjacent brain parenchyma. Persistent increased pachymeningeal thickness in contrast enhancement along the right frontal convexity and anterior  cerebral falx.   Residual tumor is again identified within the superior sagittal sinus (series 13, image 133) measuring approximately 22 x 13 x 6 mm compared to 22 x 12 x 9 mm on prior. The second focus of residual tumor described anteriorly at the falx is no longer identified.   No new focus of abnormal contrast enhancement. No acute infarct, intracranial hemorrhage or hydrocephalus.   Vascular: Normal flow voids.   Skull and upper cervical spine: Postsurgical changes from right frontal craniotomy. No focal marrow lesion identified.   Sinuses/Orbits: Negative.   IMPRESSION: 1. Stable postsurgical changes from right frontal craniotomy with unchanged area of encephalomalacia and gliosis with associated cortical laminar necrosis in the right frontal lobe. 2. Significant interval decrease in size of the epidural fluid collection subjacent to the area of craniotomy, now measuring up to 3 mm in thickness, compared to 8 mm on prior, now with minimal mass effect on the subjacent brain parenchyma. 3. Residual tumor within the superior sagittal sinus, not significantly changed in size. 4. The second focus of residual tumor described anteriorly at the falx is no longer identified.     Electronically Signed   By: Baldemar Lenis M.D.   On: 12/25/2020 12:33  Assessment/Plan Seizure  Meningioma - Plan: levETIRAcetam (KEPPRA) 250 MG tablet   Rayen Palen is clinically stable today.  MRI is pending early May, she will review with Dr. Conchita Paris.  We recommended decreasing Keppra to  BID due to mood effects, very good seizure control, seizure freedom of 3 years.  Motor function affecting the left side persists,  We ask that Nayeli Calvert return to clinic in 12 months, or sooner as needed.  All questions were answered. The patient knows to call the clinic with any problems, questions or concerns. No barriers to learning were detected.  The total time spent in  the encounter was 30 minutes and more than 50% was on counseling and review of test results   [name] Leber, MD Medical Director of Neuro-Oncology [facility] [place] 03/02/23 10:53 AM

## 2023-03-17 ENCOUNTER — Ambulatory Visit
Admission: RE | Admit: 2023-03-17 | Discharge: 2023-03-17 | Disposition: A | Discharge status: Home / Self Care | Payer: Medicare Other | Source: Ambulatory Visit | Attending: Neurosurgery | Admitting: Neurosurgery

## 2023-03-17 DIAGNOSIS — D329 Benign neoplasm of meninges, unspecified: Secondary | ICD-10-CM

## 2023-03-17 MED ORDER — GADOPICLENOL 0.5 MMOL/ML IV SOLN
7.0000 mL | Freq: Once | INTRAVENOUS | Status: AC | PRN
  Administered 2023-03-17: 7 mL via INTRAVENOUS

## 2023-04-17 ENCOUNTER — Encounter: Payer: Self-pay | Admitting: Genetic Counselor

## 2023-10-05 ENCOUNTER — Other Ambulatory Visit: Payer: Self-pay | Admitting: Neurosurgery

## 2023-10-05 DIAGNOSIS — D329 Benign neoplasm of meninges, unspecified: Secondary | ICD-10-CM

## 2023-11-03 ENCOUNTER — Ambulatory Visit
Admission: RE | Admit: 2023-11-03 | Discharge: 2023-11-03 | Disposition: A | Discharge status: Home / Self Care | Payer: Medicare Other | Source: Ambulatory Visit | Attending: Neurosurgery | Admitting: Neurosurgery

## 2023-11-03 DIAGNOSIS — D329 Benign neoplasm of meninges, unspecified: Secondary | ICD-10-CM

## 2023-11-03 MED ORDER — GADOPICLENOL 0.5 MMOL/ML IV SOLN
7.5000 mL | Freq: Once | INTRAVENOUS | Status: AC | PRN
  Administered 2023-11-03: 7 mL via INTRAVENOUS

## 2023-11-17 ENCOUNTER — Other Ambulatory Visit: Payer: Self-pay | Admitting: Radiation Therapy

## 2023-11-20 ENCOUNTER — Inpatient Hospital Stay: Payer: Medicare HMO | Attending: Radiation Oncology

## 2023-12-06 ENCOUNTER — Other Ambulatory Visit: Payer: Self-pay | Admitting: Radiation Therapy

## 2023-12-06 DIAGNOSIS — D320 Benign neoplasm of cerebral meninges: Secondary | ICD-10-CM

## 2023-12-06 DIAGNOSIS — Z6828 Body mass index (BMI) 28.0-28.9, adult: Secondary | ICD-10-CM | POA: Diagnosis not present

## 2023-12-06 DIAGNOSIS — D329 Benign neoplasm of meninges, unspecified: Secondary | ICD-10-CM | POA: Diagnosis not present

## 2023-12-07 ENCOUNTER — Telehealth: Payer: Self-pay | Admitting: Radiation Oncology

## 2023-12-07 NOTE — Telephone Encounter (Signed)
1/23 @ 10:00 am Left voicemail for patient to call our office to be schedule for consult.

## 2023-12-11 NOTE — Progress Notes (Signed)
Location/Histology of Brain Tumor: Recurrent Meningioma   Patient presented for surveillance scans.   MRI Brain 11/03/2023:  Interval increase in size of the dural-based contrast-enhancing lesion along the right frontal convexity, now measuring up to 2.5 x 1.0 cm, previously 2.0 x 0.7 cm.  New contrast-enhancing lesion along the falx measuring 1.5 x 0.9 cm.  Unchanged filling defect in the superior sagittal sinus, which likely represents residual tumor.     Past or anticipated interventions, if any, per neurosurgery:  Dr. Conchita Paris -Stereotactic craniotomy for resection of meningioma 04/16/2020 -Craniotomy for tumor excision 09/10/2013   Past or anticipated interventions, if any, per medical oncology:   Dose of Decadron, if applicable: No  Recent neurologic symptoms, if any:  Seizures:  Headaches:  Nausea:  Dizziness/ataxia:  Difficulty with hand coordination:  Focal numbness/weakness:  Visual deficits/changes:  Confusion/Memory deficits:     SAFETY ISSUES: Prior radiation? Right Frontal [facility] 04/2020 Pacemaker/ICD?  Possible current pregnancy? Hysterectomy Is the patient on methotrexate?   Additional Complaints / other details:

## 2023-12-11 NOTE — Progress Notes (Signed)
Radiation Oncology         (336) [phone] ________________________________  Name: [name] [name] MRN: [id number]  Date: 12/12/2023  DOB: June 07, 1956  WU:[id number], Ananias Pilgrim, MD  Lisbeth Renshaw, MD     REFERRING PHYSICIAN: Lisbeth Renshaw, MD   DIAGNOSIS: The primary encounter diagnosis was Meningeal tumor. A diagnosis of Recurrent meningioma of the brain [facility]) was also pertinent to this visit.   HISTORY OF PRESENT ILLNESS:[name] [name] is a 68 y.o. female who has a history of recurrent  meningioma.  The patient previously had a low-grade meningioma in 2014 for which she underwent resection.  In 2021, she was found to have 2 areas of recurrent meningioma in the right frontal and falcine region. She was worked up after presenting with left-sided weakness and seizures.  She proceeded with resection of these 2 areas which again revealed WHO grade 1 meningioma. She then proceeded with postoperative stereotactic radiosurgery [facility]) which she completed in July 2021.   She has been followed closely with MRI and a scan on 08/19/22 showed unchanged residual meningioma in the superior sagittal sinus, and mild increase in nodular dural thickening measuring about 4.5 mm at the anterior margin of the resection cavity of the right frontal lobe. Scans on 03/17/23 showed about 6 mm of thickness in the superior nodule, and the more inferior site thickness of 7 mm, previously 5 mm. Most recently her scan on 11/03/23 showed an interval change with the dural based enhancing lesion in the right frontal convexity measuring 2.5 cm, previously up to 2 cm, with 7 mm persistent thickening. A new lesion along the falx measured 1.5 cm. Unchanged filling defect in the superior sagittal sinus was also felt to represent additional residual tumor. She met with Dr. Conchita Paris and she is seen to consider additional options of radiation. She also continues to have left upper extremity deficits from prior surgery. Dr. Conchita Paris would  consider additional resection, though the patient has verbalized concerns with proceeding with further surgical intervention.   PREVIOUS RADIATION THERAPY:   05/22/20 Postop SRS Treatment: PTV1 15 Gy in 1 fraction to the target along the right frontal/parasagital region.   PAST MEDICAL HISTORY:  has a past medical history of Bruises easily, Carotid artery occlusion, Dizziness, Headache(784.0), Hyperlipidemia, PONV (postoperative nausea and vomiting), Stroke (HCC), and TIA (transient ischemic attack).     PAST SURGICAL HISTORY: Past Surgical History:  Procedure Laterality Date   ABDOMINAL HYSTERECTOMY  2002   APPLICATION OF CRANIAL NAVIGATION N/A 04/16/2020   Procedure: APPLICATION OF CRANIAL NAVIGATION;  Surgeon: Lisbeth Renshaw, MD;  Location: [facility] OR;  Service: Neurosurgery;  Laterality: N/A;   COLONOSCOPY     CRANIOTOMY Right 09/10/2013   Procedure: Craniotomy for tumor excision;  Surgeon: Lisbeth Renshaw, MD;  Location: [facility] NEURO ORS;  Service: Neurosurgery;  Laterality: Right;  Craniotomy for meningioma with stealth   CRANIOTOMY N/A 04/16/2020   Procedure: STEREOTACTIC CRANIOTOMY FOR RESECTION OF MENINGIOMA;  Surgeon: Lisbeth Renshaw, MD;  Location: [facility] OR;  Service: Neurosurgery;  Laterality: N/A;   DILATION AND CURETTAGE OF UTERUS  1980   left hand surgery  2007 and 2008   plates and screws     FAMILY HISTORY: family history includes Deep vein thrombosis in her father; Prostate cancer in her father.   SOCIAL HISTORY:  reports that she has quit smoking. She has been exposed to tobacco smoke. She has never used smokeless tobacco. She reports that she does not drink alcohol and does not use drugs. The patient is married  and [place] in [place].   ALLERGIES: Adhesive [tape]   MEDICATIONS:  Current Outpatient Medications  Medication Sig Dispense Refill   acetaminophen (TYLENOL) 325 MG tablet Take 650 mg by mouth every 6 (six) hours as needed for moderate pain or headache.      aspirin 81 MG chewable tablet Chew 1 tablet (81 mg total) by mouth daily.     atorvastatin (LIPITOR) 40 MG tablet Take 1 tablet (40 mg total) by mouth daily. 30 tablet 0   Cholecalciferol (VITAMIN D3) 50 MCG (2000 UT) TABS Take 1 tablet by mouth daily. 30 tablet 0   levETIRAcetam (KEPPRA) 250 MG tablet Take 0.5 tablets (125 mg total) by mouth 2 (two) times daily. 90 tablet 3   metoprolol succinate (TOPROL-XL) 25 MG 24 hr tablet Take 0.5 tablets (12.5 mg total) by mouth daily. 30 tablet 0   OVER THE COUNTER MEDICATION Take 1 tablet by mouth daily. Vit B Stress Tablet     pantoprazole (PROTONIX) 40 MG tablet Take 40 mg by mouth daily.     potassium chloride SA (K-DUR,KLOR-CON) 20 MEQ tablet Take 20 mEq by mouth every other day.     No current facility-administered medications for this encounter.     REVIEW OF SYSTEMS:  On review of systems, the patient reports that she is doing well. She denies any recent seizures. She reports she has still had some left hand weakness, but otherwise has regained much of her movement on the left side, and denies difficulty with walking. She is very cautious now when she drives. She has occasional headaches but no focal progressive symptoms. No other complaints are verbalized.   PHYSICAL EXAM:  Wt Readings from Last 3 Encounters:  12/12/23 161 lb 3.2 oz (73.1 kg)  03/02/23 157 lb 14.4 oz (71.6 kg)  01/12/23 157 lb 3.2 oz (71.3 kg)   Temp Readings from Last 3 Encounters:  12/12/23 (!) 97.3 F (36.3 C)  03/02/23 98.4 F (36.9 C) (Oral)  01/12/23 97.7 F (36.5 C) (Temporal)   BP Readings from Last 3 Encounters:  12/12/23 (!) 154/79  03/02/23 128/77  01/12/23 132/80   Pulse Readings from Last 3 Encounters:  12/12/23 86  03/02/23 85  01/12/23 71    ECOG = 2  0 - Asymptomatic (Fully active, able to carry on all predisease activities without restriction)  1 - Symptomatic but completely ambulatory (Restricted in physically strenuous activity but  ambulatory and able to carry out work of a light or sedentary nature. For example, light housework, office work)  2 - Symptomatic, <50% in bed during the day (Ambulatory and capable of all self care but unable to carry out any work activities. Up and about more than 50% of waking hours)  3 - Symptomatic, >50% in bed, but not bedbound (Capable of only limited self-care, confined to bed or chair 50% or more of waking hours)  4 - Bedbound (Completely disabled. Cannot carry on any self-care. Totally confined to bed or chair)  5 - Death   Santiago Glad MM, Creech RH, Tormey DC, et al. [phone]). "Toxicity and response criteria of the [facility]". Am. Evlyn Clines. Oncol. 5 (6): 649-55  Alert and oriented x3, no acute distress The surgical area has healed well without any sign of infection or poor wound healing, incision fully closed   LABORATORY DATA:  Lab Results  Component Value Date   WBC 16.0 (H) 04/22/2020   HGB 11.8 (L) 04/22/2020   HCT 36.8 04/22/2020  MCV 93.9 04/22/2020   PLT 291 04/22/2020   Lab Results  Component Value Date   NA 140 04/22/2020   K 4.1 04/22/2020   CL 105 04/22/2020   CO2 26 04/22/2020   Lab Results  Component Value Date   ALT 37 04/22/2020   AST 20 04/22/2020   ALKPHOS 64 04/22/2020   BILITOT 0.6 04/22/2020      RADIOGRAPHY: No results found.      IMPRESSION/PLAN:  Recurrent grade 1 meningioma involving the right frontal region and new meningioma involving the falx. Dr. Mitzi Hansen discusses the patient's prior history of surgery and radiation and discusses the nature of multifocal and or locally progressive meningiomas. The lesion measuring 1.5 cm in the falx is new but overlaps her prior SRS treatment field. The surgical cavity in the right frontal region also has progressive findings. He recommends treatment to both isocenters and given the prior overlap to the falx region and the surgical history and size of the cavity, he recommends 5  fractions of SRS treatment.  We discussed the risks, benefits, short, and long term effects of radiotherapy, as well as the curative intent, and the patient is interested in proceeding. Written consent is obtained and placed in the chart, a copy was provided to the patient. She will be contacted by our special procedures navigator for simulation with IV contrast, and subsequent scheduling of treatment. We will repeat her lab work in lieu of IV contrast. 2.    Concerns with transportation. We will reach out to see if she qualifies for transportation assistance or gas cards given the distance she drives for treatment.     In a visit lasting 45 minutes, greater than 50% of the time was spent face to face discussing the patient's condition, in preparation for the discussion, and coordinating the patient's care.  The above documentation reflects my direct findings during this shared patient visit. Please see the separate note by Dr. Mitzi Hansen on this date for the remainder of the patient's plan of care.     Osker Mason, [facility]     **Disclaimer: This note was dictated with voice recognition software. Similar sounding words can inadvertently be transcribed and this note may contain transcription errors which may not have been corrected upon publication of note.**

## 2023-12-12 ENCOUNTER — Other Ambulatory Visit: Payer: Self-pay | Admitting: Radiation Oncology

## 2023-12-12 ENCOUNTER — Ambulatory Visit
Admission: RE | Admit: 2023-12-12 | Discharge: 2023-12-12 | Disposition: A | Discharge status: Home / Self Care | Payer: Medicare HMO | Source: Ambulatory Visit | Attending: Radiation Oncology | Admitting: Radiation Oncology

## 2023-12-12 ENCOUNTER — Encounter: Payer: Self-pay | Admitting: Radiation Oncology

## 2023-12-12 VITALS — BP 154/79 | HR 86 | Temp 97.3°F | Resp 20 | Ht 63.0 in | Wt 161.2 lb

## 2023-12-12 DIAGNOSIS — D497 Neoplasm of unspecified behavior of endocrine glands and other parts of nervous system: Secondary | ICD-10-CM

## 2023-12-12 DIAGNOSIS — D329 Benign neoplasm of meninges, unspecified: Secondary | ICD-10-CM

## 2023-12-12 DIAGNOSIS — D320 Benign neoplasm of cerebral meninges: Secondary | ICD-10-CM | POA: Insufficient documentation

## 2023-12-12 DIAGNOSIS — E785 Hyperlipidemia, unspecified: Secondary | ICD-10-CM | POA: Insufficient documentation

## 2023-12-12 DIAGNOSIS — R569 Unspecified convulsions: Secondary | ICD-10-CM | POA: Diagnosis not present

## 2023-12-12 DIAGNOSIS — Z8673 Personal history of transient ischemic attack (TIA), and cerebral infarction without residual deficits: Secondary | ICD-10-CM | POA: Diagnosis not present

## 2023-12-12 DIAGNOSIS — Z79899 Other long term (current) drug therapy: Secondary | ICD-10-CM | POA: Insufficient documentation

## 2023-12-12 DIAGNOSIS — Z7982 Long term (current) use of aspirin: Secondary | ICD-10-CM | POA: Insufficient documentation

## 2023-12-12 DIAGNOSIS — I6529 Occlusion and stenosis of unspecified carotid artery: Secondary | ICD-10-CM | POA: Diagnosis not present

## 2023-12-12 DIAGNOSIS — R531 Weakness: Secondary | ICD-10-CM | POA: Diagnosis not present

## 2023-12-12 DIAGNOSIS — Z87891 Personal history of nicotine dependence: Secondary | ICD-10-CM | POA: Diagnosis not present

## 2023-12-12 LAB — CMP (CANCER CENTER ONLY)
ALT: 17 U/L (ref 0–44)
AST: 15 U/L (ref 15–41)
Albumin: 4.4 g/dL (ref 3.5–5.0)
Alkaline Phosphatase: 83 U/L (ref 38–126)
Anion gap: 4 — ABNORMAL LOW (ref 5–15)
BUN: 12 mg/dL (ref 8–23)
CO2: 31 mmol/L (ref 22–32)
Calcium: 10 mg/dL (ref 8.9–10.3)
Chloride: 107 mmol/L (ref 98–111)
Creatinine: 0.69 mg/dL (ref 0.44–1.00)
GFR, Estimated: >60 mL/min (ref >60)
Glucose, Bld: 89 mg/dL (ref 70–99)
Potassium: 4.5 mmol/L (ref 3.5–5.1)
Sodium: 142 mmol/L (ref 135–145)
Total Bilirubin: 0.4 mg/dL (ref 0.0–1.2)
Total Protein: 7.6 g/dL (ref 6.5–8.1)

## 2023-12-13 ENCOUNTER — Other Ambulatory Visit: Payer: Self-pay | Admitting: Radiation Therapy

## 2023-12-13 DIAGNOSIS — D320 Benign neoplasm of cerebral meninges: Secondary | ICD-10-CM

## 2023-12-14 DIAGNOSIS — D329 Benign neoplasm of meninges, unspecified: Secondary | ICD-10-CM | POA: Diagnosis not present

## 2023-12-18 NOTE — Progress Notes (Signed)
 Has armband been applied?  yes  Does patient have an allergy to IV contrast dye?: No   Has patient ever received premedication for IV contrast dye?: n/a   Does patient take metformin?: No  If patient does take metformin when was the last dose: n/a  Date of lab work: 12/12/2023 BUN: 12 CR: 0.69 eGfr: >60  IV site: Rt antecubital   Has IV site been added to flowsheet?  yes  BP (!) 144/74 (BP Location: Left Arm, Patient Position: Sitting, Cuff Size: Normal)   Pulse 95   Temp (!) 97.5 F (36.4 C)   Resp 20   Ht 5' 3 (1.6 m)   Wt 161 lb 12.8 oz (73.4 kg)   SpO2 98%   BMI 28.66 kg/m

## 2023-12-19 ENCOUNTER — Ambulatory Visit
Admission: RE | Admit: 2023-12-19 | Discharge: 2023-12-19 | Disposition: A | Discharge status: Home / Self Care | Payer: Medicare HMO | Source: Ambulatory Visit | Attending: Radiation Oncology | Admitting: Radiation Oncology

## 2023-12-19 DIAGNOSIS — D320 Benign neoplasm of cerebral meninges: Secondary | ICD-10-CM | POA: Diagnosis not present

## 2023-12-19 MED ORDER — GADOPICLENOL 0.5 MMOL/ML IV SOLN
7.5000 mL | Freq: Once | INTRAVENOUS | Status: AC | PRN
  Administered 2023-12-19: 7.5 mL via INTRAVENOUS

## 2023-12-20 ENCOUNTER — Ambulatory Visit
Admission: RE | Admit: 2023-12-20 | Discharge: 2023-12-20 | Disposition: A | Discharge status: Home / Self Care | Payer: Medicare HMO | Source: Ambulatory Visit | Attending: Radiation Oncology | Admitting: Radiation Oncology

## 2023-12-20 VITALS — BP 144/74 | HR 95 | Temp 97.5°F | Resp 20 | Ht 63.0 in | Wt 161.8 lb

## 2023-12-20 DIAGNOSIS — D329 Benign neoplasm of meninges, unspecified: Secondary | ICD-10-CM | POA: Diagnosis not present

## 2023-12-20 DIAGNOSIS — D320 Benign neoplasm of cerebral meninges: Secondary | ICD-10-CM | POA: Diagnosis not present

## 2023-12-27 DIAGNOSIS — D320 Benign neoplasm of cerebral meninges: Secondary | ICD-10-CM | POA: Diagnosis not present

## 2023-12-27 DIAGNOSIS — D329 Benign neoplasm of meninges, unspecified: Secondary | ICD-10-CM | POA: Diagnosis not present

## 2023-12-28 ENCOUNTER — Ambulatory Visit
Admission: RE | Admit: 2023-12-28 | Discharge: 2023-12-28 | Disposition: A | Discharge status: Home / Self Care | Payer: Medicare HMO | Source: Ambulatory Visit | Attending: Radiation Oncology | Admitting: Radiation Oncology

## 2023-12-28 ENCOUNTER — Other Ambulatory Visit: Payer: Self-pay

## 2023-12-28 DIAGNOSIS — D320 Benign neoplasm of cerebral meninges: Secondary | ICD-10-CM | POA: Diagnosis not present

## 2023-12-28 LAB — RAD ONC ARIA SESSION SUMMARY
Course Elapsed Days: 0
Plan Fractions Treated to Date: 1
Plan Prescribed Dose Per Fraction: 6 Gy
Plan Total Fractions Prescribed: 5
Plan Total Prescribed Dose: 30 Gy
Reference Point Dosage Given to Date: 6 Gy
Reference Point Session Dosage Given: 6 Gy
Session Number: 1

## 2023-12-28 NOTE — Op Note (Signed)
  Name: [name] [name]  MRN: [id number]  Date: 12/28/2023   DOB: 05/10/1956  Stereotactic Radiosurgery Operative Note  PRE-OPERATIVE DIAGNOSIS:  Recurrent intracranial meningioma  POST-OPERATIVE DIAGNOSIS:  Same  PROCEDURE:  Stereotactic Radiosurgery  SURGEON:  Jackelyn Hoehn, MD  NARRATIVE: The patient underwent a radiation treatment planning session in the radiation oncology simulation suite under the care of the radiation oncology physician and physicist.  I participated closely in the radiation treatment planning afterwards. The patient underwent planning CT which was fused to 3T high resolution MRI with 1 mm axial slices.  These images were fused on the planning system.  We contoured the gross target volumes and subsequently expanded this to yield the Planning Target Volume. I actively participated in the planning process.  I helped to define and review the target contours and also the contours of the optic pathway, eyes, brainstem and selected nearby organs at risk.  All the dose constraints for critical structures were reviewed and compared to AAPM Task Group 101.  The prescription dose conformity was reviewed.  I approved the plan electronically.    Accordingly, Rhonda Linan was brought to the TrueBeam stereotactic radiation treatment linac and placed in the custom immobilization mask.  The patient was aligned according to the IR fiducial markers with BrainLab Exactrac, then orthogonal x-rays were used in ExacTrac with the 6DOF robotic table and the shifts were made to align the patient  Rhianne Soman received stereotactic radiosurgery uneventfully to an Rx dose of 6Gy as the first of 5 planned fractions.    Lesions treated:  2   Complex lesions treated:  0 (>3.5 cm, <39mm of optic path, or within the brainstem)   The detailed description of the procedure is recorded in the radiation oncology procedure note.  I was present for the duration of the procedure.  DISPOSITION:   Following delivery, the patient was transported to nursing in stable condition and monitored for possible acute effects to be discharged to home in stable condition with follow-up in one month.  Jackelyn Hoehn, MD 12/28/2023 1:49 PM

## 2023-12-28 NOTE — Progress Notes (Signed)
Mrs. [name] [name] rested with [name] for 15 minutes following her SRS treatment.  Patient denies headache, dizziness, nausea, diplopia or ringing in the ears. Denies fatigue. Patient without complaints. Understands to avoid strenuous activity for the next 24 hours and call [phone] with needs.  Patient walked out of the clinic without difficulty.  BP (!) 150/82 (BP Location: Left Arm, Patient Position: Sitting, Cuff Size: Normal)   Pulse 78   Temp 97.6 F (36.4 C)   Resp 18   SpO2 100%

## 2024-01-01 ENCOUNTER — Other Ambulatory Visit: Payer: Self-pay

## 2024-01-01 ENCOUNTER — Ambulatory Visit
Admission: RE | Admit: 2024-01-01 | Discharge: 2024-01-01 | Disposition: A | Discharge status: Home / Self Care | Payer: Medicare HMO | Source: Ambulatory Visit | Attending: Radiation Oncology | Admitting: Radiation Oncology

## 2024-01-01 DIAGNOSIS — D320 Benign neoplasm of cerebral meninges: Secondary | ICD-10-CM | POA: Diagnosis not present

## 2024-01-01 LAB — RAD ONC ARIA SESSION SUMMARY
Course Elapsed Days: 4
Plan Fractions Treated to Date: 2
Plan Prescribed Dose Per Fraction: 6 Gy
Plan Total Fractions Prescribed: 5
Plan Total Prescribed Dose: 30 Gy
Reference Point Dosage Given to Date: 12 Gy
Reference Point Session Dosage Given: 6 Gy
Session Number: 2

## 2024-01-01 NOTE — Progress Notes (Signed)
[name] [name] rested with [name] for 15 minutes following her SRS treatment.  Patient denies headache, dizziness, nausea, diplopia or ringing in the ears. Denies fatigue. Patient without complaints. Understands to avoid strenuous activity for the next 24 hours and call [phone] with needs.  Patient walked out of the clinic without difficulty.  Vitals:  97.7-84-18-144/80 O2 sat 100% RA.

## 2024-01-03 ENCOUNTER — Ambulatory Visit: Payer: Medicare HMO | Admitting: Radiation Oncology

## 2024-01-05 ENCOUNTER — Ambulatory Visit
Admission: RE | Admit: 2024-01-05 | Discharge: 2024-01-05 | Disposition: A | Discharge status: Home / Self Care | Payer: Medicare HMO | Source: Ambulatory Visit | Attending: Radiation Oncology | Admitting: Radiation Oncology

## 2024-01-05 ENCOUNTER — Other Ambulatory Visit: Payer: Self-pay

## 2024-01-05 DIAGNOSIS — I6529 Occlusion and stenosis of unspecified carotid artery: Secondary | ICD-10-CM

## 2024-01-05 DIAGNOSIS — D320 Benign neoplasm of cerebral meninges: Secondary | ICD-10-CM | POA: Diagnosis not present

## 2024-01-05 LAB — RAD ONC ARIA SESSION SUMMARY
Course Elapsed Days: 8
Plan Fractions Treated to Date: 3
Plan Prescribed Dose Per Fraction: 6 Gy
Plan Total Fractions Prescribed: 5
Plan Total Prescribed Dose: 30 Gy
Reference Point Dosage Given to Date: 18 Gy
Reference Point Session Dosage Given: 6 Gy
Session Number: 3

## 2024-01-05 NOTE — Progress Notes (Signed)
[name] [name] to nursing for 15 minutes observation SRS Brain.  Denies headache, dizziness, nausea, diplopia or ringing in the ears. Denies fatigue. Patient without complaints. Understands to avoid strenuous activity for the next 24 hours and call [phone] with needs. Patient walked out of the clinic without difficulty. Vitals:  97.8-81-18-134/79 O2 sat 96% RA.

## 2024-01-08 ENCOUNTER — Other Ambulatory Visit: Payer: Self-pay

## 2024-01-08 ENCOUNTER — Ambulatory Visit
Admission: RE | Admit: 2024-01-08 | Discharge: 2024-01-08 | Disposition: A | Discharge status: Home / Self Care | Payer: Medicare HMO | Source: Ambulatory Visit | Attending: Radiation Oncology | Admitting: Radiation Oncology

## 2024-01-08 DIAGNOSIS — D320 Benign neoplasm of cerebral meninges: Secondary | ICD-10-CM | POA: Diagnosis not present

## 2024-01-08 LAB — RAD ONC ARIA SESSION SUMMARY
Course Elapsed Days: 11
Plan Fractions Treated to Date: 4
Plan Prescribed Dose Per Fraction: 6 Gy
Plan Total Fractions Prescribed: 5
Plan Total Prescribed Dose: 30 Gy
Reference Point Dosage Given to Date: 24 Gy
Reference Point Session Dosage Given: 6 Gy
Session Number: 4

## 2024-01-08 NOTE — Progress Notes (Signed)
 [name] [name] rested with [name] for 15 minutes following her SRS treatment.  Patient denies headache, dizziness, nausea, diplopia or ringing in the ears. Denies fatigue. Patient without complaints. Understands to avoid strenuous activity for the next 24 hours and call [phone] with needs.  Patient walked out of the clinic without difficulty.    BP 136/78 (BP Location: Left Arm)   Pulse 86   Temp 97.7 F (36.5 C)   Resp 18   SpO2 98%

## 2024-01-10 ENCOUNTER — Ambulatory Visit
Admission: RE | Admit: 2024-01-10 | Discharge: 2024-01-10 | Disposition: A | Discharge status: Home / Self Care | Payer: Medicare HMO | Source: Ambulatory Visit | Attending: Radiation Oncology | Admitting: Radiation Oncology

## 2024-01-10 ENCOUNTER — Other Ambulatory Visit: Payer: Self-pay

## 2024-01-10 DIAGNOSIS — D320 Benign neoplasm of cerebral meninges: Secondary | ICD-10-CM | POA: Diagnosis not present

## 2024-01-10 DIAGNOSIS — Z510 Encounter for antineoplastic radiation therapy: Secondary | ICD-10-CM | POA: Diagnosis not present

## 2024-01-10 DIAGNOSIS — D329 Benign neoplasm of meninges, unspecified: Secondary | ICD-10-CM | POA: Diagnosis not present

## 2024-01-10 LAB — RAD ONC ARIA SESSION SUMMARY
Course Elapsed Days: 13
Plan Fractions Treated to Date: 5
Plan Prescribed Dose Per Fraction: 6 Gy
Plan Total Fractions Prescribed: 5
Plan Total Prescribed Dose: 30 Gy
Reference Point Dosage Given to Date: 30 Gy
Reference Point Session Dosage Given: 6 Gy
Session Number: 5

## 2024-01-10 NOTE — Progress Notes (Signed)
 Mrs. Cordoba rested with [name] for 15 minutes following her SRS treatment.  Patient denies headache, dizziness, nausea, diplopia or ringing in the ears. Denies fatigue. Patient without complaints. Understands to avoid strenuous activity for the next 24 hours and call [phone] with needs.  Patient walked out of the clinic without difficulty.    BP (!) 136/90   Pulse 73   Temp 97.7 F (36.5 C)   Resp 18   SpO2 100%

## 2024-01-11 NOTE — Radiation Completion Notes (Addendum)
  Radiation Oncology         [phone] ________________________________  Name: [name] [name] MRN: [id number]  Date of Service: 01/10/2024  DOB: [birth year]  End of Treatment Note    Diagnosis:  Recurrent grade 1 meningioma involving the right frontal region and new meningioma involving the falx  Intent: Palliative     ==========DELIVERED PLANS==========  First Treatment Date: 2023-12-28 Last Treatment Date: 2024-01-10   Plan Name: Brain_SRT Site: Brain PTV_2_FrontalR_66mm  PTV_3_FrontalR_88mm  Technique: SBRT/SRT-IMRT Mode: Photon Dose Per Fraction: 6 Gy Prescribed Dose (Delivered / Prescribed): 30 Gy / 30 Gy Prescribed Fxs (Delivered / Prescribed): 5 / 5     ==========ON TREATMENT VISIT DATES========== 2023-12-28, 2024-01-01, 2024-01-05, 2024-01-08, 2024-01-10    See weekly On Treatment Notes in Epic for details in the Media tab (listed as Progress notes on the On Treatment Visit Dates listed above). The patient tolerated radiation. She developed fatigue and anticipated skin changes in the treatment field.   The patient will receive a call in about one month from the radiation oncology department. She will continue follow up with Dr. Barbaraann Cao as well.      Osker Mason, PAC

## 2024-01-12 ENCOUNTER — Ambulatory Visit (INDEPENDENT_AMBULATORY_CARE_PROVIDER_SITE_OTHER): Payer: Medicare HMO | Admitting: Physician Assistant

## 2024-01-12 ENCOUNTER — Ambulatory Visit (HOSPITAL_COMMUNITY)
Admission: RE | Admit: 2024-01-12 | Discharge: 2024-01-12 | Disposition: A | Discharge status: Home / Self Care | Payer: Medicare HMO | Source: Ambulatory Visit | Attending: Vascular Surgery | Admitting: Vascular Surgery

## 2024-01-12 VITALS — BP 132/83 | HR 85 | Temp 98.6°F | Resp 18 | Ht 63.0 in | Wt 160.4 lb

## 2024-01-12 DIAGNOSIS — I6529 Occlusion and stenosis of unspecified carotid artery: Secondary | ICD-10-CM

## 2024-01-12 NOTE — Progress Notes (Signed)
 History of Present Illness:  Patient is a 68 y.o. year old female who presents for evaluation of carotid stenosis.    She has known fibromuscular dysplasia affecting the carotids bilaterally.  Surgical history is significant for right frontal craniotomy by Dr. Conchita Paris for a resection of a meningioma in October 2014.  She then required a repeat craniotomy to resect multiple tumors in June 2021.  Postoperatively she experienced left facial weakness, and left arm and leg weakness.  She states the left-sided weakness has been stable.  She has not been diagnosed with a TIA or CVA since last office visit.   Of note she has had return of intracranial meningioma and is going through radiation treatments. The patient denies symptoms of TIA, amaurosis, aphasia, new dysarthria, or stroke. She is medically managed on ASA and Statin.  She states when the weather is goo she walks outside for exercise and this time of year she goes shopping to get her walk in indoors.      Past Medical History:  Diagnosis Date   Bruises easily    Carotid artery occlusion    Dizziness    Headache(784.0)    Hyperlipidemia    takes Atorvastatin daily   PONV (postoperative nausea and vomiting)    Stroke (HCC)    TIA (transient ischemic attack)     Past Surgical History:  Procedure Laterality Date   ABDOMINAL HYSTERECTOMY  2002   APPLICATION OF CRANIAL NAVIGATION N/A 04/16/2020   Procedure: APPLICATION OF CRANIAL NAVIGATION;  Surgeon: Lisbeth Renshaw, MD;  [facility]: [facility];  Service: Neurosurgery;  Laterality: N/A;   COLONOSCOPY     CRANIOTOMY Right 09/10/2013   Procedure: Craniotomy for tumor excision;  Surgeon: Lisbeth Renshaw, MD;  Location: [facility];  Service: Neurosurgery;  Laterality: Right;  Craniotomy for meningioma with stealth   CRANIOTOMY N/A 04/16/2020   Procedure: STEREOTACTIC CRANIOTOMY FOR RESECTION OF MENINGIOMA;  Surgeon: Lisbeth Renshaw, MD;  Location: [facility];  Service: Neurosurgery;   Laterality: N/A;   DILATION AND CURETTAGE OF UTERUS  1980   left hand surgery  2007 and 2008   plates and screws     Social History Social History   Tobacco Use   Smoking status: Former    Passive exposure: Current (Husband)   Smokeless tobacco: Never   Tobacco comments:    quit 56yrs ago  Vaping Use   Vaping status: Never Used  Substance Use Topics   Alcohol use: No   Drug use: No    Family History Family History  Problem Relation Age of Onset   Prostate cancer Father    Deep vein thrombosis Father     Allergies  Allergies  Allergen Reactions   Adhesive [Tape]     Rash popped up after using bandaids, telemetry stickers.     Current Outpatient Medications  Medication Sig Dispense Refill   acetaminophen (TYLENOL) 325 MG tablet Take 650 mg by mouth every 6 (six) hours as needed for moderate pain or headache.     aspirin 81 MG chewable tablet Chew 1 tablet (81 mg total) by mouth daily.     atorvastatin (LIPITOR) 40 MG tablet Take 1 tablet (40 mg total) by mouth daily. 30 tablet 0   Cholecalciferol (VITAMIN D3) 50 MCG (2000 UT) TABS Take 1 tablet by mouth daily. 30 tablet 0   levETIRAcetam (KEPPRA) 250 MG tablet Take 0.5 tablets (125 mg total) by mouth 2 (two) times daily. 90 tablet 3   metoprolol  succinate (TOPROL-XL) 25 MG 24 hr tablet Take 0.5 tablets (12.5 mg total) by mouth daily. 30 tablet 0   OVER THE COUNTER MEDICATION Take 1 tablet by mouth daily. Vit B Stress Tablet     pantoprazole (PROTONIX) 40 MG tablet Take 40 mg by mouth daily.     potassium chloride SA (K-DUR,KLOR-CON) 20 MEQ tablet Take 20 mEq by mouth every other day.     No current facility-administered medications for this visit.    ROS:   General:  No weight loss, Fever, chills  HEENT: No recent headaches, no nasal bleeding, no visual changes, no sore throat  Neurologic: No dizziness, blackouts, seizures. No recent symptoms of stroke or mini- stroke. No recent episodes of slurred speech, or  temporary blindness.  Cardiac: No recent episodes of chest pain/pressure, no shortness of breath at rest.  No shortness of breath with exertion.  Denies history of atrial fibrillation or irregular heartbeat  Vascular: No history of rest pain in feet.  No history of claudication.  No history of non-healing ulcer, No history of DVT   Pulmonary: No home oxygen, no productive cough, no hemoptysis,  No asthma or wheezing  Musculoskeletal:  [ ]  Arthritis, [ ]  Low back pain,  [ ]  Joint pain  Hematologic:No history of hypercoagulable state.  No history of easy bleeding.  No history of anemia  Gastrointestinal: No hematochezia or melena,  No gastroesophageal reflux, no trouble swallowing  Urinary: [ ]  chronic Kidney disease, [ ]  on HD - [ ]  MWF or [ ]  TTHS, [ ]  Burning with urination, [ ]  Frequent urination, [ ]  Difficulty urinating;   Skin: No rashes  Psychological: No history of anxiety,  No history of depression   Physical Examination  Vitals:   01/12/24 1014 01/12/24 1015  BP: 131/84 132/83  Pulse: 85   Resp: 18   Temp: 98.6 F (37 C)   TempSrc: Temporal   SpO2: 95%   Weight: 160 lb 6.4 oz (72.8 kg)   Height: 5\' 3"  (1.6 m)     Body mass index is 28.41 kg/m.  General:  Alert and oriented, no acute distress HEENT: Normal Neck: No bruit or JVD Pulmonary: Clear to auscultation bilaterally Cardiac: Regular Rate and Rhythm without murmur Gastrointestinal: Soft, non-tender, non-distended, no mass, no scars Skin: No rash Extremity Pulses:  2+ radial,  femoral, dorsalis pedis, pulses bilaterally Musculoskeletal: No deformity or edema  Neurologic: Upper and lower extremity motor grossly intact Left side body weakness without change  DATA:  Right Carotid Findings:  +----------+--------+--------+--------+------------------+--------+           PSV cm/sEDV cm/sStenosisPlaque DescriptionComments  +----------+--------+--------+--------+------------------+--------+  CCA Prox   90      11              heterogenous                +----------+--------+--------+--------+------------------+--------+  CCA Distal81      22              heterogenous                +----------+--------+--------+--------+------------------+--------+  ICA Prox  53      22              heterogenous                +----------+--------+--------+--------+------------------+--------+  ICA Mid   180     70      60-79%                              +----------+--------+--------+--------+------------------+--------+  ICA Distal72      19                                          +----------+--------+--------+--------+------------------+--------+  ECA      93      14                                          +----------+--------+--------+--------+------------------+--------+   +----------+--------+-------+--------+-------------------+           PSV cm/sEDV cmsDescribeArm Pressure (mmHG)  +----------+--------+-------+--------+-------------------+  [id number]                                         +----------+--------+-------+--------+-------------------+   +---------+--------+--+--------+--+---------+  VertebralPSV cm/s46EDV cm/s12Antegrade  +---------+--------+--+--------+--+---------+      Left Carotid Findings:  +----------+--------+--------+--------+------------------+--------+           PSV cm/sEDV cm/sStenosisPlaque DescriptionComments  +----------+--------+--------+--------+------------------+--------+  CCA Prox  101     27              heterogenous                +----------+--------+--------+--------+------------------+--------+  CCA Distal86      26              heterogenous                +----------+--------+--------+--------+------------------+--------+  ICA Prox  72      33              heterogenous                +----------+--------+--------+--------+------------------+--------+  ICA Mid    161     67      60-79%                              +----------+--------+--------+--------+------------------+--------+  ICA Distal86      29                                          +----------+--------+--------+--------+------------------+--------+  ECA      78      16                                          +----------+--------+--------+--------+------------------+--------+   +----------+--------+--------+--------+-------------------+           PSV cm/sEDV cm/sDescribeArm Pressure (mmHG)  +----------+--------+--------+--------+-------------------+  [id number]                                         +----------+--------+--------+--------+-------------------+   +---------+--------+--+--------+--+---------+  VertebralPSV cm/s76EDV cm/s26Antegrade  +---------+--------+--+--------+--+---------+      Summary:  Right Carotid: Velocities in the right ICA are consistent with a 60-79%                 stenosis.   Left Carotid: Velocities in the left ICA are consistent with a  60-79%  stenosis.   Vertebrals: Bilateral vertebral arteries demonstrate antegrade flow.    ASSESSMENT/PLAN: Carotid stenosis B ICA without symptoms of stroke/TIA The duplex shows increased stenosis B ICA < 79%.  She is medically managed on ASA and Statin and exercising.  We reviewed symptoms of stroke and if these occur she will call 911.   Since she has increased stenosis I will have her return in 6 months for repeat duplex.    If the duplex shows greater than 80 % stenosis we will order a neck CTA to confirm stenosis.  And she will f/u with Dr. Hetty Blend.  She agrees with this plan.        Fabienne Bruns, MD [facility] Office: [phone] Pager: [phone]

## 2024-01-16 DIAGNOSIS — J019 Acute sinusitis, unspecified: Secondary | ICD-10-CM | POA: Diagnosis not present

## 2024-01-21 DIAGNOSIS — I619 Nontraumatic intracerebral hemorrhage, unspecified: Secondary | ICD-10-CM | POA: Diagnosis not present

## 2024-01-21 DIAGNOSIS — G4089 Other seizures: Secondary | ICD-10-CM | POA: Diagnosis not present

## 2024-01-21 DIAGNOSIS — R531 Weakness: Secondary | ICD-10-CM | POA: Diagnosis not present

## 2024-01-21 DIAGNOSIS — T45515A Adverse effect of anticoagulants, initial encounter: Secondary | ICD-10-CM | POA: Diagnosis not present

## 2024-01-21 DIAGNOSIS — I6201 Nontraumatic acute subdural hemorrhage: Secondary | ICD-10-CM | POA: Diagnosis not present

## 2024-01-21 DIAGNOSIS — I639 Cerebral infarction, unspecified: Secondary | ICD-10-CM | POA: Diagnosis not present

## 2024-01-21 DIAGNOSIS — Z79899 Other long term (current) drug therapy: Secondary | ICD-10-CM | POA: Diagnosis not present

## 2024-01-21 DIAGNOSIS — G9389 Other specified disorders of brain: Secondary | ICD-10-CM | POA: Diagnosis not present

## 2024-01-21 DIAGNOSIS — J329 Chronic sinusitis, unspecified: Secondary | ICD-10-CM | POA: Diagnosis not present

## 2024-01-21 DIAGNOSIS — D496 Neoplasm of unspecified behavior of brain: Secondary | ICD-10-CM | POA: Diagnosis not present

## 2024-01-21 DIAGNOSIS — G459 Transient cerebral ischemic attack, unspecified: Secondary | ICD-10-CM | POA: Diagnosis not present

## 2024-01-21 DIAGNOSIS — R1310 Dysphagia, unspecified: Secondary | ICD-10-CM | POA: Diagnosis not present

## 2024-01-21 DIAGNOSIS — R29810 Facial weakness: Secondary | ICD-10-CM | POA: Diagnosis not present

## 2024-01-21 DIAGNOSIS — G40119 Localization-related (focal) (partial) symptomatic epilepsy and epileptic syndromes with simple partial seizures, intractable, without status epilepticus: Secondary | ICD-10-CM | POA: Diagnosis not present

## 2024-01-21 DIAGNOSIS — S065XAA Traumatic subdural hemorrhage with loss of consciousness status unknown, initial encounter: Secondary | ICD-10-CM | POA: Diagnosis not present

## 2024-01-21 DIAGNOSIS — I10 Essential (primary) hypertension: Secondary | ICD-10-CM | POA: Diagnosis not present

## 2024-01-21 DIAGNOSIS — I6200 Nontraumatic subdural hemorrhage, unspecified: Secondary | ICD-10-CM | POA: Diagnosis not present

## 2024-01-21 DIAGNOSIS — R569 Unspecified convulsions: Secondary | ICD-10-CM | POA: Diagnosis not present

## 2024-01-21 DIAGNOSIS — I773 Arterial fibromuscular dysplasia: Secondary | ICD-10-CM | POA: Diagnosis not present

## 2024-01-21 DIAGNOSIS — Z9889 Other specified postprocedural states: Secondary | ICD-10-CM | POA: Diagnosis not present

## 2024-01-21 DIAGNOSIS — K219 Gastro-esophageal reflux disease without esophagitis: Secondary | ICD-10-CM | POA: Diagnosis not present

## 2024-01-21 DIAGNOSIS — D329 Benign neoplasm of meninges, unspecified: Secondary | ICD-10-CM | POA: Diagnosis not present

## 2024-01-21 DIAGNOSIS — D6832 Hemorrhagic disorder due to extrinsic circulating anticoagulants: Secondary | ICD-10-CM | POA: Diagnosis not present

## 2024-01-21 DIAGNOSIS — D320 Benign neoplasm of cerebral meninges: Secondary | ICD-10-CM | POA: Diagnosis not present

## 2024-01-22 DIAGNOSIS — K219 Gastro-esophageal reflux disease without esophagitis: Secondary | ICD-10-CM | POA: Diagnosis not present

## 2024-01-22 DIAGNOSIS — S065XAA Traumatic subdural hemorrhage with loss of consciousness status unknown, initial encounter: Secondary | ICD-10-CM | POA: Diagnosis not present

## 2024-01-22 DIAGNOSIS — D329 Benign neoplasm of meninges, unspecified: Secondary | ICD-10-CM | POA: Diagnosis not present

## 2024-01-22 DIAGNOSIS — R569 Unspecified convulsions: Secondary | ICD-10-CM | POA: Diagnosis not present

## 2024-01-22 DIAGNOSIS — I10 Essential (primary) hypertension: Secondary | ICD-10-CM | POA: Diagnosis not present

## 2024-01-23 DIAGNOSIS — I10 Essential (primary) hypertension: Secondary | ICD-10-CM | POA: Diagnosis not present

## 2024-01-23 DIAGNOSIS — S065XAA Traumatic subdural hemorrhage with loss of consciousness status unknown, initial encounter: Secondary | ICD-10-CM | POA: Diagnosis not present

## 2024-01-23 DIAGNOSIS — R569 Unspecified convulsions: Secondary | ICD-10-CM | POA: Diagnosis not present

## 2024-01-23 DIAGNOSIS — D329 Benign neoplasm of meninges, unspecified: Secondary | ICD-10-CM | POA: Diagnosis not present

## 2024-01-23 DIAGNOSIS — K219 Gastro-esophageal reflux disease without esophagitis: Secondary | ICD-10-CM | POA: Diagnosis not present

## 2024-01-25 ENCOUNTER — Other Ambulatory Visit: Payer: Self-pay | Admitting: *Deleted

## 2024-01-25 DIAGNOSIS — I6529 Occlusion and stenosis of unspecified carotid artery: Secondary | ICD-10-CM

## 2024-01-31 DIAGNOSIS — D329 Benign neoplasm of meninges, unspecified: Secondary | ICD-10-CM | POA: Diagnosis not present

## 2024-02-01 DIAGNOSIS — R5383 Other fatigue: Secondary | ICD-10-CM | POA: Diagnosis not present

## 2024-02-01 DIAGNOSIS — E7849 Other hyperlipidemia: Secondary | ICD-10-CM | POA: Diagnosis not present

## 2024-02-01 DIAGNOSIS — R7301 Impaired fasting glucose: Secondary | ICD-10-CM | POA: Diagnosis not present

## 2024-02-05 ENCOUNTER — Ambulatory Visit
Admission: RE | Admit: 2024-02-05 | Discharge: 2024-02-05 | Disposition: A | Discharge status: Home / Self Care | Payer: Medicare HMO | Source: Ambulatory Visit | Attending: Radiation Oncology | Admitting: Radiation Oncology

## 2024-02-05 NOTE — Progress Notes (Signed)
  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  The patient did  note fatigue during radiation w/ slow improvement. The patient did not note hair loss or skin changes in the field of radiation during therapy. The patient is not taking dexamethasone. The patient does have symptoms of weakness in the LT ARM. The patient does have symptoms of occasional headaches 6/10. The patient does not have symptoms of seizure or uncontrolled movement. The patient does not have symptoms of changes in vision. The patient does not have changes in speech. The patient does not have confusion.   The patient was counseled that she will be contacted by our brain and spine navigator to schedule surveillance imaging. The patient was encouraged to call if she have not received a call to schedule imaging, or if she develops concerns or questions regarding radiation. The patient will also continue to follow up with Dr. Barbaraann Cao on 02/29/24 in medical oncology.   This concludes the interaction.  Ruel Favors, LPN

## 2024-02-06 ENCOUNTER — Other Ambulatory Visit: Payer: Self-pay | Admitting: Radiation Therapy

## 2024-02-06 DIAGNOSIS — D329 Benign neoplasm of meninges, unspecified: Secondary | ICD-10-CM

## 2024-02-08 DIAGNOSIS — R29818 Other symptoms and signs involving the nervous system: Secondary | ICD-10-CM | POA: Diagnosis not present

## 2024-02-08 DIAGNOSIS — D320 Benign neoplasm of cerebral meninges: Secondary | ICD-10-CM | POA: Diagnosis not present

## 2024-02-08 DIAGNOSIS — G8194 Hemiplegia, unspecified affecting left nondominant side: Secondary | ICD-10-CM | POA: Diagnosis not present

## 2024-02-08 DIAGNOSIS — I6529 Occlusion and stenosis of unspecified carotid artery: Secondary | ICD-10-CM | POA: Diagnosis not present

## 2024-02-08 DIAGNOSIS — Z6827 Body mass index (BMI) 27.0-27.9, adult: Secondary | ICD-10-CM | POA: Diagnosis not present

## 2024-02-27 ENCOUNTER — Other Ambulatory Visit: Payer: Self-pay | Admitting: Radiation Therapy

## 2024-02-29 ENCOUNTER — Inpatient Hospital Stay: Payer: Medicare Other | Attending: Radiation Oncology | Admitting: Internal Medicine

## 2024-02-29 VITALS — BP 122/76 | HR 81 | Temp 97.9°F | Resp 18 | Ht 63.0 in | Wt 159.6 lb

## 2024-02-29 DIAGNOSIS — D420 Neoplasm of uncertain behavior of cerebral meninges: Secondary | ICD-10-CM | POA: Insufficient documentation

## 2024-02-29 DIAGNOSIS — R569 Unspecified convulsions: Secondary | ICD-10-CM

## 2024-02-29 DIAGNOSIS — Z79899 Other long term (current) drug therapy: Secondary | ICD-10-CM | POA: Insufficient documentation

## 2024-02-29 DIAGNOSIS — D320 Benign neoplasm of cerebral meninges: Secondary | ICD-10-CM | POA: Diagnosis not present

## 2024-02-29 MED ORDER — LACOSAMIDE 100 MG PO TABS: 100.0000 mg | ORAL_TABLET | Freq: Two times a day (BID) | ORAL | 2 refills | Status: DC

## 2024-02-29 NOTE — Progress Notes (Signed)
 [facility] at [facility] [address]  [place], [place] [address] [phone]   Interval Evaluation  Date of Service: 02/29/24 Patient Name: [name] [name] Patient MRN: [id number] Patient DOB: 1956/09/02 Provider: Henreitta Leber, MD  Identifying Statement:  [name] [name] is a 68 y.o. female with  multifocal  meningioma  Oncologic History: 09/10/13: Craniotomy, resection with Dr. Conchita Paris. 04/21/20: R frontal and falcine recurrence, repeat resection debulkeing with Nundkumar 05/22/20: SRS to residual tumor along falx and sinus with Dr. Kathrynn Running 01/10/24: Salvage SRS to both treated meningiomas for ongoing progression [name] [name])  Interval History:  [name] [name] presents today for follow up, now having completed salvage radiation with Dr [name] [name] in February.  She did have an admission at [facility] in March for breakthrough focal seizures.  Event was characterized as "left hand/arm clenching, shaking".  Keppra had been increased to 500mg  twice per day, but this is leading to more agitation as prior.  Otherwise denies new or progressive deficits today.  Denies headaches.  Medications: Current Outpatient Medications on File Prior to Visit  Medication Sig Dispense Refill   ondansetron (ZOFRAN-ODT) 4 MG disintegrating tablet Take by mouth.     acetaminophen (TYLENOL) 325 MG tablet Take 650 mg by mouth every 6 (six) hours as needed for moderate pain or headache.     aspirin 81 MG chewable tablet Chew 1 tablet (81 mg total) by mouth daily.     atorvastatin (LIPITOR) 40 MG tablet Take 1 tablet (40 mg total) by mouth daily. 30 tablet 0   b complex vitamins capsule Take 1 capsule by mouth daily.     Cholecalciferol (VITAMIN D3) 50 MCG (2000 UT) TABS Take 1 tablet by mouth daily. 30 tablet 0   levETIRAcetam (KEPPRA) 250 MG tablet Take 0.5 tablets (125 mg total) by mouth 2 (two) times daily. 90 tablet 3   metoprolol succinate (TOPROL-XL) 25 MG 24 hr tablet Take 0.5  tablets (12.5 mg total) by mouth daily. 30 tablet 0   OVER THE COUNTER MEDICATION Take 1 tablet by mouth daily. Vit B Stress Tablet     pantoprazole (PROTONIX) 40 MG tablet Take 40 mg by mouth daily.     potassium chloride SA (K-DUR,KLOR-CON) 20 MEQ tablet Take 20 mEq by mouth every other day.     No current facility-administered medications on file prior to visit.    Allergies:  Allergies  Allergen Reactions   Adhesive [Tape]     Rash popped up after using bandaids, telemetry stickers.   Past Medical History:  Past Medical History:  Diagnosis Date   Bruises easily    Carotid artery occlusion    Dizziness    Headache(784.0)    Hyperlipidemia    takes Atorvastatin daily   PONV (postoperative nausea and vomiting)    Stroke (HCC)    TIA (transient ischemic attack)    Past Surgical History:  Past Surgical History:  Procedure Laterality Date   ABDOMINAL HYSTERECTOMY  2002   APPLICATION OF CRANIAL NAVIGATION N/A 04/16/2020   Procedure: APPLICATION OF CRANIAL NAVIGATION;  Surgeon: Lisbeth Renshaw, MD;  Location: [facility] OR;  Service: Neurosurgery;  Laterality: N/A;   COLONOSCOPY     CRANIOTOMY Right 09/10/2013   Procedure: Craniotomy for tumor excision;  Surgeon: Lisbeth Renshaw, MD;  Location: [facility];  Service: Neurosurgery;  Laterality: Right;  Craniotomy for meningioma with stealth   CRANIOTOMY N/A 04/16/2020   Procedure: STEREOTACTIC CRANIOTOMY FOR RESECTION OF MENINGIOMA;  Surgeon: Lisbeth Renshaw, MD;  Location:  [facility] OR;  Service: Neurosurgery;  Laterality: N/A;   DILATION AND CURETTAGE OF UTERUS  1980   left hand surgery  2007 and 2008   plates and screws   Social History:  Social History   Socioeconomic History   Marital status: Married    Spouse name: Not on file   Number of children: Not on file   Years of education: Not on file   Highest education level: Not on file  Occupational History   Not on file  Tobacco Use   Smoking status: Former    Passive  exposure: Current (Husband)   Smokeless tobacco: Never   Tobacco comments:    quit 76yrs ago  Vaping Use   Vaping status: Never Used  Substance and Sexual Activity   Alcohol use: No   Drug use: No   Sexual activity: Not Currently    Birth control/protection: Surgical  Other Topics Concern   Not on file  Social History Narrative   Not on file   Social Drivers of Health   Financial Resource Strain: Not on file  Food Insecurity: No Food Insecurity (01/21/2024)   Received from [facility]   Hunger Vital Sign    Worried About Running Out of Food in the Last Year: Never true    Ran Out of Food in the Last Year: Never true  Transportation Needs: No Transportation Needs (01/21/2024)   Received from [facility] - Transportation    Lack of Transportation (Medical): No    Lack of Transportation (Non-Medical): No  Physical Activity: Not on file  Stress: No Stress Concern Present (01/21/2024)   Received from [facility] - Occupational Stress Questionnaire    Feeling of Stress : Not at all  Social Connections: Unknown (03/28/2022)   Received from [facility]   Social Network    Social Network: Not on file  Intimate Partner Violence: Not At Risk (01/21/2024)   Received from [facility]   HITS    Over the last 12 months how often did your partner physically hurt you?: Never    Over the last 12 months how often did your partner insult you or talk down to you?: Never    Over the last 12 months how often did your partner threaten you with physical harm?: Never    Over the last 12 months how often did your partner scream or curse at you?: Never   Family History:  Family History  Problem Relation Age of Onset   Prostate cancer Father    Deep vein thrombosis Father     Review of Systems: Constitutional: Doesn't report fevers, chills or abnormal weight loss Eyes: Doesn't report blurriness of vision Ears, nose,  mouth, throat, and face: Doesn't report sore throat Respiratory: Doesn't report cough, dyspnea or wheezes Cardiovascular: Doesn't report palpitation, chest discomfort  Gastrointestinal:  Doesn't report nausea, constipation, diarrhea GU: Doesn't report incontinence Skin: Doesn't report skin rashes Neurological: Per HPI Musculoskeletal: Doesn't report joint pain Behavioral/Psych: Doesn't report anxiety  Physical Exam: Vitals:   02/29/24 1008  BP: 122/76  Pulse: 81  Resp: 18  Temp: 97.9 F (36.6 C)  SpO2: 98%   KPS: 70. General: Alert, cooperative, pleasant, in no acute distress Head: Normal EENT: No conjunctival injection or scleral icterus.  Lungs: Resp effort normal Cardiac: Regular rate Abdomen: Non-distended abdomen Skin: No rashes cyanosis or petechiae. Extremities: No clubbing or edema  Neurologic Exam: Mental Status: Awake, alert,  attentive to examiner. Oriented to self and environment. Language is fluent with intact comprehension.  Cranial Nerves: Visual acuity is grossly normal. Visual fields are full. Extra-ocular movements intact. No ptosis. Face is symmetric Motor: Tone and bulk are normal. Power 4/5 in left arm and leg. Reflexes are symmetric, no pathologic reflexes present.  Sensory: Intact to light touch Gait: Hemiparetic  Labs: I have reviewed the data as listed    Component Value Date/Time   NA 142 12/12/2023 1104   K 4.5 12/12/2023 1104   CL 107 12/12/2023 1104   CO2 31 12/12/2023 1104   GLUCOSE 89 12/12/2023 1104   BUN 12 12/12/2023 1104   CREATININE 0.69 12/12/2023 1104   CALCIUM 10.0 12/12/2023 1104   PROT 7.6 12/12/2023 1104   ALBUMIN 4.4 12/12/2023 1104   AST 15 12/12/2023 1104   ALT 17 12/12/2023 1104   ALKPHOS 83 12/12/2023 1104   BILITOT 0.4 12/12/2023 1104   GFRNONAA >60 12/12/2023 1104   GFRAA >60 04/22/2020 0532   Lab Results  Component Value Date   WBC 16.0 (H) 04/22/2020   NEUTROABS 8.7 (H) 04/22/2020   HGB 11.8 (L) 04/22/2020    HCT 36.8 04/22/2020   MCV 93.9 04/22/2020   PLT 291 04/22/2020     Assessment/Plan Recurrent meningioma of the brain [facility])  Seizure (HCC)   [name] [name] is clinically stable today, now having completed SRS for recurrent mengingiomas within falx and right frontal convexity.  Post-RT MRI is currently scheduled for the end of May.  Due to mood effects, we recommended transitioning from Keppra to Vimpat 100mg  BID.  She is agreeable with this, will evaluate response at our next visit in early June.  Motor function affecting the left side persists as prior.  We ask that [name] [name] return to clinic in 2 months following upcoming MRI brain, or sooner as needed.  All questions were answered. The patient knows to call the clinic with any problems, questions or concerns. No barriers to learning were detected.  The total time spent in the encounter was 30 minutes and more than 50% was on counseling and review of test results   Mamie Searles, MD Medical Director of Neuro-Oncology [facility] 02/29/24 10:21 AM

## 2024-03-01 ENCOUNTER — Telehealth: Payer: Self-pay | Admitting: Internal Medicine

## 2024-03-01 NOTE — Telephone Encounter (Signed)
 Patient scheduled appointments. Patient is aware of all appointment details.

## 2024-03-16 DIAGNOSIS — H524 Presbyopia: Secondary | ICD-10-CM | POA: Diagnosis not present

## 2024-03-16 DIAGNOSIS — Z0100 Encounter for examination of eyes and vision without abnormal findings: Secondary | ICD-10-CM | POA: Diagnosis not present

## 2024-03-31 DIAGNOSIS — G40209 Localization-related (focal) (partial) symptomatic epilepsy and epileptic syndromes with complex partial seizures, not intractable, without status epilepticus: Secondary | ICD-10-CM | POA: Diagnosis not present

## 2024-03-31 DIAGNOSIS — Z91048 Other nonmedicinal substance allergy status: Secondary | ICD-10-CM | POA: Diagnosis not present

## 2024-03-31 DIAGNOSIS — Z79899 Other long term (current) drug therapy: Secondary | ICD-10-CM | POA: Diagnosis not present

## 2024-04-01 ENCOUNTER — Telehealth: Payer: Self-pay | Admitting: *Deleted

## 2024-04-01 NOTE — Telephone Encounter (Signed)
 Patient called requesting letter due to recent seizures and restrictions on driving and operating vehicles which limit her ability to participate in a remote health and fitness center.

## 2024-04-01 NOTE — Telephone Encounter (Signed)
Communicated response to patient.

## 2024-04-01 NOTE — Telephone Encounter (Signed)
 Patient called to report a new seizure yesterday presented as myoclonic in nature (heart racing, mouth twitching, arm jerking) lasted approximately 2 minutes.  Was taken to [facility] in [place].  Provider there increased her Vimpat  from 100 BID to 200 am/100 pm.  She is concerned about the double dose.  Wanted to make sure that he was agreeable to the increase.  Wanted his opinion on the dosing.  Routed to Dr Mark Sil.

## 2024-04-03 ENCOUNTER — Encounter: Payer: Self-pay | Admitting: *Deleted

## 2024-04-03 ENCOUNTER — Telehealth: Payer: Self-pay | Admitting: *Deleted

## 2024-04-03 NOTE — Telephone Encounter (Signed)
-----   Message from Mamie Searles sent at 04/03/2024 10:29 AM EDT ----- Regarding: RE: Stop vimpat  and resume keppra  500 bid ----- Message ----- From: Priscille Brought, RN Sent: 04/03/2024  10:15 AM EDT To: Mamie Searles, MD Subject: RE:                                            Any specific instructions as far as dosing, on stopping the Vimpat  or restarting the Keppra ? ----- Message ----- From: Mamie Searles, MD Sent: 04/03/2024   9:47 AM EDT To: Priscille Brought, RN Subject: RE:                                            Yes ----- Message ----- From: Priscille Brought, RN Sent: 04/03/2024   9:38 AM EDT To: Mamie Searles, MD  This patient called, she says she is not tolerating her Vimpat , she C/O feeling like her heart is racing, dizziness, generally feeling "sick."  She is asking if she can go back on Keppra .  Please advise.  Marily Shows

## 2024-04-03 NOTE — Progress Notes (Signed)
 Letter dated 04/01/24 mailed to patient per her request.

## 2024-04-03 NOTE — Telephone Encounter (Signed)
 PC to patient, informed her of Dr Boris Byars recommendation below - she is to stop her Vimpat  & restart her Keppra  500 mg twice a day.  Patient verbalizes understanding.

## 2024-04-04 ENCOUNTER — Encounter: Payer: Self-pay | Admitting: Radiation Oncology

## 2024-04-09 ENCOUNTER — Ambulatory Visit
Admission: RE | Admit: 2024-04-09 | Discharge: 2024-04-09 | Disposition: A | Discharge status: Home / Self Care | Source: Ambulatory Visit | Attending: Radiation Oncology | Admitting: Radiation Oncology

## 2024-04-09 DIAGNOSIS — D320 Benign neoplasm of cerebral meninges: Secondary | ICD-10-CM | POA: Diagnosis not present

## 2024-04-09 DIAGNOSIS — D329 Benign neoplasm of meninges, unspecified: Secondary | ICD-10-CM

## 2024-04-09 MED ORDER — GADOPICLENOL 0.5 MMOL/ML IV SOLN
7.5000 mL | Freq: Once | INTRAVENOUS | Status: AC | PRN
  Administered 2024-04-09: 7.5 mL via INTRAVENOUS

## 2024-04-11 DIAGNOSIS — R002 Palpitations: Secondary | ICD-10-CM | POA: Diagnosis not present

## 2024-04-11 DIAGNOSIS — Z7982 Long term (current) use of aspirin: Secondary | ICD-10-CM | POA: Diagnosis not present

## 2024-04-11 DIAGNOSIS — R079 Chest pain, unspecified: Secondary | ICD-10-CM | POA: Diagnosis not present

## 2024-04-11 DIAGNOSIS — Z79899 Other long term (current) drug therapy: Secondary | ICD-10-CM | POA: Diagnosis not present

## 2024-04-15 ENCOUNTER — Encounter

## 2024-04-18 ENCOUNTER — Other Ambulatory Visit: Payer: Self-pay | Admitting: Radiation Therapy

## 2024-04-18 ENCOUNTER — Other Ambulatory Visit: Payer: Self-pay

## 2024-04-18 ENCOUNTER — Inpatient Hospital Stay: Attending: Radiation Oncology | Admitting: Internal Medicine

## 2024-04-18 VITALS — BP 149/84 | HR 101 | Temp 97.5°F | Resp 18 | Wt 153.4 lb

## 2024-04-18 DIAGNOSIS — R569 Unspecified convulsions: Secondary | ICD-10-CM | POA: Diagnosis not present

## 2024-04-18 DIAGNOSIS — Z79899 Other long term (current) drug therapy: Secondary | ICD-10-CM | POA: Insufficient documentation

## 2024-04-18 DIAGNOSIS — D320 Benign neoplasm of cerebral meninges: Secondary | ICD-10-CM

## 2024-04-18 DIAGNOSIS — D420 Neoplasm of uncertain behavior of cerebral meninges: Secondary | ICD-10-CM | POA: Insufficient documentation

## 2024-04-18 MED ORDER — DEXAMETHASONE 4 MG PO TABS
4.0000 mg | ORAL_TABLET | Freq: Every day | ORAL | 0 refills | Status: DC
Start: 2024-04-18 — End: 2024-04-25

## 2024-04-18 NOTE — Progress Notes (Signed)
 [facility] [address]  [place], [place] [address] [phone]   Interval Evaluation  Date of Service: 04/18/24 Patient Name: [name] [name] Patient MRN: [id number] Patient DOB: 1956-03-27 Provider: Mamie Searles, MD  Identifying Statement:  [name] [name] is a 68 y.o. female with multifocal meningioma  Oncologic History: 09/10/13: Craniotomy, resection with Dr. Nat [name]. 04/21/20: R frontal and falcine recurrence, repeat resection debulkeing with [name] [name] 05/22/20: SRS to residual tumor along falx and sinus with Dr. Lorri [name] 01/10/24: Salvage SRS to both treated meningiomas for ongoing progression [name] [name])  Interval History:  [name] [name] presents today for follow up after recent MRI brain.  She describes weakness and dysfunction with her left hand developing over recent weeks.  She has difficulty using the hand for specific tasks like tying shoes.  No issues with the arm or leg otherwise. Keppra  remains at 500mg  twice per day after poor tolerance of vimpat .  Otherwise no breakthrough seizures.  Denies headaches.  Medications: Current Outpatient Medications on File Prior to Visit  Medication Sig Dispense Refill   acetaminophen  (TYLENOL ) 325 MG tablet Take 650 mg by mouth every 6 (six) hours as needed for moderate pain or headache.     aspirin  81 MG chewable tablet Chew 1 tablet (81 mg total) by mouth daily.     atorvastatin  (LIPITOR) 40 MG tablet Take 1 tablet (40 mg total) by mouth daily. 30 tablet 0   b complex vitamins capsule Take 1 capsule by mouth daily.     Cholecalciferol  (VITAMIN D3) 50 MCG (2000 UT) TABS Take 1 tablet by mouth daily. 30 tablet 0   metoprolol  succinate (TOPROL -XL) 25 MG 24 hr tablet Take 0.5 tablets (12.5 mg total) by mouth daily. 30 tablet 0   ondansetron  (ZOFRAN -ODT) 4 MG disintegrating tablet Take by mouth.     OVER THE COUNTER MEDICATION Take 1 tablet by mouth daily. Vit B Stress Tablet      pantoprazole  (PROTONIX ) 40 MG tablet Take 40 mg by mouth daily.     potassium chloride  SA (K-DUR,KLOR-CON ) 20 MEQ tablet Take 20 mEq by mouth every other day.     Lacosamide  (VIMPAT ) 100 MG TABS Take 1 tablet (100 mg total) by mouth in the morning and at bedtime. 60 tablet 2   No current facility-administered medications on file prior to visit.    Allergies:  Allergies  Allergen Reactions   Adhesive [Tape]     Rash popped up after using bandaids, telemetry stickers.   Past Medical History:  Past Medical History:  Diagnosis Date   Bruises easily    Carotid artery occlusion    Dizziness    Headache(784.0)    Hyperlipidemia    takes Atorvastatin  daily   PONV (postoperative nausea and vomiting)    Stroke (HCC)    TIA (transient ischemic attack)    Past Surgical History:  Past Surgical History:  Procedure Laterality Date   ABDOMINAL HYSTERECTOMY  2002   APPLICATION OF CRANIAL NAVIGATION N/A 04/16/2020   Procedure: APPLICATION OF CRANIAL NAVIGATION;  Surgeon: Augusto Blonder, MD;  Location: [facility] OR;  Service: Neurosurgery;  Laterality: N/A;   COLONOSCOPY     CRANIOTOMY Right 09/10/2013   Procedure: Craniotomy for tumor excision;  Surgeon: Augusto Blonder, MD;  Location: [facility] NEURO ORS;  Service: Neurosurgery;  Laterality: Right;  Craniotomy for meningioma with stealth   CRANIOTOMY N/A 04/16/2020   Procedure: STEREOTACTIC CRANIOTOMY FOR RESECTION OF MENINGIOMA;  Surgeon: Augusto Blonder, MD;  Location: [facility] OR;  Service: Neurosurgery;  Laterality: N/A;   DILATION AND CURETTAGE OF UTERUS  1980   left hand surgery  2007 and 2008   plates and screws   Social History:  Social History   Socioeconomic History   Marital status: Married    Spouse name: Not on file   Number of children: Not on file   Years of education: Not on file   Highest education level: Not on file  Occupational History   Not on file  Tobacco Use   Smoking status: Former    Passive exposure: Current (Husband)    Smokeless tobacco: Never   Tobacco comments:    quit 42yrs ago  Vaping Use   Vaping status: Never Used  Substance and Sexual Activity   Alcohol use: No   Drug use: No   Sexual activity: Not Currently    Birth control/protection: Surgical  Other Topics Concern   Not on file  Social History Narrative   Not on file   Social Drivers of Health   Financial Resource Strain: Not on file  Food Insecurity: No Food Insecurity (01/21/2024)   [facility]   Hunger Vital Sign    Worried About Running Out of Food in the Last Year: Never true    Ran Out of Food in the Last Year: Never true  Transportation Needs: No Transportation Needs (01/21/2024)   Received from [facility] - Transportation    Lack of Transportation (Medical): No    Lack of Transportation (Non-Medical): No  Physical Activity: Not on file  Stress: No Stress Concern Present (01/21/2024)   Received from [facility] - Occupational Stress Questionnaire    Feeling of Stress : Not at all  Social Connections: Unknown (03/28/2022)   Received from [facility]   Social Network    Social Network: Not on file  Intimate Partner Violence: Not At Risk (01/21/2024)   Received from [facility]   HITS    Over the last 12 months how often did your partner physically hurt you?: Never    Over the last 12 months how often did your partner insult you or talk down to you?: Never    Over the last 12 months how often did your partner threaten you with physical harm?: Never    Over the last 12 months how often did your partner scream or curse at you?: Never   Family History:  Family History  Problem Relation Age of Onset   Prostate cancer Father    Deep vein thrombosis Father     Review of Systems: Constitutional: Doesn't report fevers, chills or abnormal weight loss Eyes: Doesn't report blurriness of vision Ears, nose, mouth, throat, and face: Doesn't  report sore throat Respiratory: Doesn't report cough, dyspnea or wheezes Cardiovascular: Doesn't report palpitation, chest discomfort  Gastrointestinal:  Doesn't report nausea, constipation, diarrhea GU: Doesn't report incontinence Skin: Doesn't report skin rashes Neurological: Per HPI Musculoskeletal: Doesn't report joint pain Behavioral/Psych: Doesn't report anxiety  Physical Exam: Vitals:   04/18/24 1110  BP: (!) 149/84  Pulse: (!) 101  Resp: 18  Temp: (!) 97.5 F (36.4 C)  SpO2: 100%   KPS: 70. General: Alert, cooperative, pleasant, in no acute distress Head: Normal EENT: No conjunctival injection or scleral icterus.  Lungs: Resp effort normal Cardiac: Regular rate Abdomen: Non-distended abdomen Skin: No rashes cyanosis or petechiae. Extremities: No clubbing or edema  Neurologic Exam: Mental Status: Awake, alert,  attentive to examiner. Oriented to self and environment. Language is fluent with intact comprehension.  Cranial Nerves: Visual acuity is grossly normal. Visual fields are full. Extra-ocular movements intact. No ptosis. Face is symmetric Motor: Tone and bulk are normal. Power 4+/5 in left arm and leg, noted fine motor impairment in left hand. Reflexes are symmetric, no pathologic reflexes present.  Sensory: Intact to light touch Gait: Hemiparetic  Labs: I have reviewed the data as listed    Component Value Date/Time   NA 142 12/12/2023 1104   K 4.5 12/12/2023 1104   CL 107 12/12/2023 1104   CO2 31 12/12/2023 1104   GLUCOSE 89 12/12/2023 1104   BUN 12 12/12/2023 1104   CREATININE 0.69 12/12/2023 1104   CALCIUM  10.0 12/12/2023 1104   PROT 7.6 12/12/2023 1104   ALBUMIN  4.4 12/12/2023 1104   AST 15 12/12/2023 1104   ALT 17 12/12/2023 1104   ALKPHOS 83 12/12/2023 1104   BILITOT 0.4 12/12/2023 1104   GFRNONAA >60 12/12/2023 1104   GFRAA >60 04/22/2020 0532   Lab Results  Component Value Date   WBC 16.0 (H) 04/22/2020   NEUTROABS 8.7 (H) 04/22/2020    HGB 11.8 (L) 04/22/2020   HCT 36.8 04/22/2020   MCV 93.9 04/22/2020   PLT 291 04/22/2020    Imaging:  [facility] Clinician Interpretation: I have personally reviewed the CNS images as listed.  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 Progression of right frontal convexity, para falcine involvement last year. Status post SRS in February. Restaging. EXAM: MRI HEAD WITHOUT AND WITH CONTRAST TECHNIQUE: Multiplanar, multiecho pulse sequences of the brain and surrounding structures were obtained without and with intravenous contrast. CONTRAST:  7.5 mL Vueway  COMPARISON:  Brain MRI 12/19/2023 and earlier. FINDINGS: Brain: More heterogeneously enhancing, less confluent and solid appearance of the lobular right lateral convexity extra-axial mass since the pre SRS comparison 12/19/2023 (series 13, image 114 now versus series 13, image 102 at that time). New leptomeningeal thickening and enhancement there (series 15, image 31 now versus series 15, image 31 previously). Overall size of the residual tumor nodularity there not significantly changed (about 19 mm cranial caudal on series 15, image 27 versus 18-19 mm previously). And similar findings of the nearby right para falcine extra-axial tumor which is more heterogeneously, less solidly enhancing on series 15, image 25) and also demonstrates new nearby leptomeningeal thickening and enhancement (image 29). The nodular area of tumor enhancement there is slightly larger compared to February, about 2 cm diameter now versus 18 x 13 mm previously. Regional T2 and FLAIR hyperintensity has mildly progressed as seen on series 10, image 49. However, there is no significant increase in regional mass effect. Nearly  contiguous midline vertex tumor involving the superior sagittal sinus appears stable since February, series 15, image 20). And chronic extra-axial thickening and faint heterogeneous enhancement underlying the craniotomy site there is stable (same image). Hemosiderin in the area on SWI does not appear significantly changed. No superimposed restricted diffusion to suggest acute infarction. No midline shift, ventriculomegaly, or acute intracranial hemorrhage. Cervicomedullary junction and pituitary are within normal limits. And no new signal abnormality remote from the treatment area. Vascular: Major intracranial vascular flow voids are stable. Skull and upper cervical spine: Stable craniotomy. Negative for age visible  cervical spine and spinal cord. Sinuses/Orbits: Stable and negative. Other: Mastoids are clear. Visible internal auditory structures appear normal. Stable scalp soft tissues. IMPRESSION: 1. Mixed response following SRS to two right hemisphere recurrent meningiomas: - both lesions are less solidly, more heterogeneously enhancing now. - regional new leptomeningeal thickening and enhancement at both sites. - the para-falcine lesion has mildly enlarged from up to 18 mm up to 21 mm long axis now. - regional T2 and FLAIR hyperintensity are mildly progressed. - regional mass effect is mild and unchanged. 2. Otherwise stable post treatment appearance of the brain, including smaller midline chronic meningioma segmentally occluding the superior sagittal sinus. Electronically Signed   By: Marlise Simpers M.D.   On: 04/09/2024 13:55    Assessment/Plan Recurrent meningioma of the brain [facility])  Seizure (HCC)   Yasuko Lapage presents with noted worsening of motor deficits in the left hand.  This localizes to primary site of disease and recent radiation.  MRI brain demonstrates progression of enhancing volume within falx, and leptomeningeal pattern of signal extension within right frontal site, both re-treated in  February.    Findings, timing, patterns are most consistent with radiation treatment effect.  Do not have strong suspicion for leptomeningeal carcinomatosis given the histology and treatment history.  Recommended continuing imaging surveillance.  For focal symptoms, recommended trial of dexamethasone  4mg  daily.    Will otherwise continue Keppra  500mg  BID.  We ask that Veleta Yamamoto return to clinic in 1 week to assess response to steroids.  Next MRI brain should be in 2 months, pending response to treatment.  She is agreeable with this.  All questions were answered. The patient knows to call the clinic with any problems, questions or concerns. No barriers to learning were detected.  The total time spent in the encounter was 40 minutes and more than 50% was on counseling and review of test results   [name] Searles, MD Medical Director of Neuro-Oncology [facility] 04/18/24 11:36 AM

## 2024-04-22 ENCOUNTER — Telehealth: Payer: Self-pay | Admitting: *Deleted

## 2024-04-22 ENCOUNTER — Inpatient Hospital Stay

## 2024-04-22 NOTE — Telephone Encounter (Signed)
 Returned PC to patient - she called earlier, she has been taking dexamethasone  4 mg for the last three days, states she was supposed to reduce her dose to 1 mg today.  However, she hasn't taken it today, she reports feeling very shaky, her mouth is very dry, heart is racing at times, states "I can't function, I'm not like myself."  On call back to patient, she clarified that she is now down to "one pill a day, not 1 mg a day."  Her decadron  tablets are 4 mg each.  Dr Mark Sil informed, he states she may reduce decadron  dose, he has phone visit scheduled with her on 04/25/24.  Informed patient if she cannot tolerate the 4 mg, she may cut this in half for a 2 mg dose.  If she cannot tolerate this, Dr Mark Sil states she may stop the decadron  and they will discuss steroid management during her phone visit.  She verbalizes understanding.

## 2024-04-25 ENCOUNTER — Inpatient Hospital Stay: Admitting: Internal Medicine

## 2024-04-25 DIAGNOSIS — D320 Benign neoplasm of cerebral meninges: Secondary | ICD-10-CM | POA: Diagnosis not present

## 2024-04-25 MED ORDER — DEXAMETHASONE 1 MG PO TABS: ORAL_TABLET | ORAL | 0 refills | Status: AC

## 2024-04-25 NOTE — Progress Notes (Signed)
 I connected with [name] [name] on 04/25/24 at  2:30 PM EDT by telephone visit and verified that I am speaking with the correct person using two identifiers.  I discussed the limitations, risks, security and privacy concerns of performing an evaluation and management service by telemedicine and the availability of in-person appointments. I also discussed with the patient that there may be a patient responsible charge related to this service. The patient expressed understanding and agreed to proceed.  Other persons participating in the visit and their role in the encounter:  n/a   Patient's location:  Home Provider's location:  Office Chief Complaint:  Recurrent meningioma of the brain [facility]) - Plan: MR BRAIN W WO CONTRAST  History of Present Ilness: [name] [name] reports improvement in left sided weakness with the decadron .  Should could not tolerate the 4mg  dose, so she reduced to 2mg , which may not be helping the weakness as much.  No other new or progressive changes, no further seizures.  Continues on Keppra  500mg  twice per day after poor tolerance of Vimpat .  Observations: Language and cognition at baseline  Assessment and Plan: Recurrent meningioma of the brain [facility]) - Plan: MR BRAIN W WO CONTRAST  Clinically improved with steroids, just need to find proper dose for her.    Recommended decreasing decadron  by 1mg  each week, starting with 3mg  daily tomorrow.  Dose may be modified if focal symptoms recur.  Follow Up Instructions: RTC in ~6 weeks following next MRI brain, or sooner if needed.  I discussed the assessment and treatment plan with the patient.  The patient was provided an opportunity to ask questions and all were answered.  The patient agreed with the plan and demonstrated understanding of the instructions.    The patient was advised to call back or seek an in-person evaluation if the symptoms worsen or if the condition fails to improve as anticipated.    Meagan Spease K Abdoul Encinas,  MD   I provided 20 minutes of non face-to-face telephone visit time during this encounter, and > 50% was spent counseling as documented under my assessment & plan.

## 2024-04-29 ENCOUNTER — Other Ambulatory Visit: Payer: Self-pay | Admitting: Radiation Therapy

## 2024-05-27 ENCOUNTER — Telehealth: Payer: Self-pay | Admitting: *Deleted

## 2024-05-27 ENCOUNTER — Other Ambulatory Visit: Payer: Self-pay | Admitting: Internal Medicine

## 2024-05-27 MED ORDER — LEVETIRACETAM 750 MG PO TABS: 750.0000 mg | ORAL_TABLET | Freq: Two times a day (BID) | ORAL | 2 refills | Status: DC

## 2024-05-27 NOTE — Telephone Encounter (Signed)
-----   Message from Zachary K Vaslow sent at 05/27/2024 12:42 PM EDT ----- Let's increase Keppra  to 750mg  BID, I placed order.  I will see her following her MRI at the end of the month. ----- Message ----- From: Marget Rudell ORN, RN Sent: 05/27/2024  11:03 AM EDT To: Zachary K Vaslow, MD  Ms Gardiner called this morning, she reports having a small seizure last night around 8 pm.  She said the L side of her face started twitching & then she had jerking on her L side.  She says this didn't last very long.  She is asking if any of her meds need to be changed.  She is no longer taking any dex & keppra  dose is the same, 500 mg twice a day.

## 2024-05-27 NOTE — Telephone Encounter (Signed)
 Returned PC to patient, informed of Dr Eward recommendation as below:  she is to start taking Keppra  750 mg twice a day, a new prescription has been sent to her pharmacy.  Dr Buckley will see her after her upcoming MRI as scheduled.  Dr Buckley also said it is ok for her to take ibuprofen for her headaches.  Patient verbalizes understanding.

## 2024-06-06 ENCOUNTER — Encounter: Payer: Self-pay | Admitting: Internal Medicine

## 2024-06-13 ENCOUNTER — Inpatient Hospital Stay
Admission: RE | Admit: 2024-06-13 | Discharge: 2024-06-13 | Discharge status: Home / Self Care | Source: Ambulatory Visit | Attending: Internal Medicine

## 2024-06-13 DIAGNOSIS — D496 Neoplasm of unspecified behavior of brain: Secondary | ICD-10-CM | POA: Diagnosis not present

## 2024-06-13 DIAGNOSIS — D320 Benign neoplasm of cerebral meninges: Secondary | ICD-10-CM

## 2024-06-13 MED ORDER — GADOPICLENOL 0.5 MMOL/ML IV SOLN
7.0000 mL | Freq: Once | INTRAVENOUS | Status: AC | PRN
  Administered 2024-06-13: 7 mL via INTRAVENOUS

## 2024-06-17 ENCOUNTER — Other Ambulatory Visit: Payer: Self-pay | Admitting: Internal Medicine

## 2024-06-17 ENCOUNTER — Inpatient Hospital Stay (HOSPITAL_BASED_OUTPATIENT_CLINIC_OR_DEPARTMENT_OTHER): Admitting: Internal Medicine

## 2024-06-17 ENCOUNTER — Inpatient Hospital Stay: Attending: Radiation Oncology

## 2024-06-17 VITALS — BP 139/91 | HR 93 | Temp 97.3°F | Resp 20 | Wt 158.4 lb

## 2024-06-17 DIAGNOSIS — Z87891 Personal history of nicotine dependence: Secondary | ICD-10-CM | POA: Diagnosis not present

## 2024-06-17 DIAGNOSIS — D320 Benign neoplasm of cerebral meninges: Secondary | ICD-10-CM | POA: Insufficient documentation

## 2024-06-17 DIAGNOSIS — D420 Neoplasm of uncertain behavior of cerebral meninges: Secondary | ICD-10-CM | POA: Diagnosis present

## 2024-06-17 DIAGNOSIS — Z79899 Other long term (current) drug therapy: Secondary | ICD-10-CM | POA: Insufficient documentation

## 2024-06-17 DIAGNOSIS — Z7982 Long term (current) use of aspirin: Secondary | ICD-10-CM | POA: Insufficient documentation

## 2024-06-17 MED ORDER — DEXAMETHASONE 1 MG PO TABS: 1.0000 mg | ORAL_TABLET | Freq: Every day | ORAL | 1 refills | Status: DC

## 2024-06-17 NOTE — Progress Notes (Signed)
 [facility] [address]  [place], [place] [address] [phone]   Interval Evaluation  Date of Service: 06/17/24 Patient Name: [name] [name] Patient MRN: [id number] Patient DOB: 17-Sep-1956 Provider: Arthea MARLA Manns, MD  Identifying Statement:  [name] [name] is a 68 y.o. female with multifocal meningioma  Oncologic History: 09/10/13: Craniotomy, resection with Dr. Lanis. 04/21/20: R frontal and falcine recurrence, repeat resection debulkeing with Nundkumar 05/22/20: SRS to residual tumor along falx and sinus with Dr. Patrcia 01/10/24: Salvage SRS to both treated meningiomas for ongoing progression [name] [name])  Interval History:  [name] [name] presents today for follow up after recent MRI brain.  She continues to describe mild weakness and dysfunction with her left hand, no longer dosing any decadron .  She has difficulty using the hand for specific tasks like tying shoes.  She did feel better overall when on the decadron  1mg  daily . No issues with the arm or leg otherwise. Keppra  remains at 500mg  twice per day after poor tolerance of vimpat .  Otherwise no breakthrough seizures.  Denies headaches.  Medications: Current Outpatient Medications on File Prior to Visit  Medication Sig Dispense Refill   acetaminophen  (TYLENOL ) 325 MG tablet Take 650 mg by mouth every 6 (six) hours as needed for moderate pain or headache.     aspirin  81 MG chewable tablet Chew 1 tablet (81 mg total) by mouth daily.     atorvastatin  (LIPITOR) 40 MG tablet Take 1 tablet (40 mg total) by mouth daily. 30 tablet 0   b complex vitamins capsule Take 1 capsule by mouth daily.     Cholecalciferol  (VITAMIN D3) 50 MCG (2000 UT) TABS Take 1 tablet by mouth daily. 30 tablet 0   levETIRAcetam  (KEPPRA ) 750 MG tablet Take 1 tablet (750 mg total) by mouth 2 (two) times daily. 60 tablet 2   metoprolol  succinate (TOPROL -XL) 25 MG 24 hr tablet Take 0.5 tablets (12.5 mg total) by  mouth daily. 30 tablet 0   ondansetron  (ZOFRAN -ODT) 4 MG disintegrating tablet Take by mouth.     OVER THE COUNTER MEDICATION Take 1 tablet by mouth daily. Vit B Stress Tablet     pantoprazole  (PROTONIX ) 40 MG tablet Take 40 mg by mouth daily.     potassium chloride  SA (K-DUR,KLOR-CON ) 20 MEQ tablet Take 20 mEq by mouth every other day.     No current facility-administered medications on file prior to visit.    Allergies:  Allergies  Allergen Reactions   Adhesive [Tape]     Rash popped up after using bandaids, telemetry stickers.   Past Medical History:  Past Medical History:  Diagnosis Date   Bruises easily    Carotid artery occlusion    Dizziness    Headache(784.0)    Hyperlipidemia    takes Atorvastatin  daily   PONV (postoperative nausea and vomiting)    Stroke (HCC)    TIA (transient ischemic attack)    Past Surgical History:  Past Surgical History:  Procedure Laterality Date   ABDOMINAL HYSTERECTOMY  2002   APPLICATION OF CRANIAL NAVIGATION N/A 04/16/2020   Procedure: APPLICATION OF CRANIAL NAVIGATION;  Surgeon: Lanis Pupa, MD;  Location: [facility] OR;  Service: Neurosurgery;  Laterality: N/A;   COLONOSCOPY     CRANIOTOMY Right 09/10/2013   Procedure: Craniotomy for tumor excision;  Surgeon: Pupa Lanis, MD;  Location: [facility] NEURO ORS;  Service: Neurosurgery;  Laterality: Right;  Craniotomy for meningioma with stealth   CRANIOTOMY N/A 04/16/2020   Procedure: STEREOTACTIC CRANIOTOMY  FOR RESECTION OF MENINGIOMA;  Surgeon: Lanis Pupa, MD;  Location: [facility] OR;  Service: Neurosurgery;  Laterality: N/A;   DILATION AND CURETTAGE OF UTERUS  1980   left hand surgery  2007 and 2008   plates and screws   Social History:  Social History   Socioeconomic History   Marital status: Married    Spouse name: Not on file   Number of children: Not on file   Years of education: Not on file   Highest education level: Not on file  Occupational History   Not on file  Tobacco  Use   Smoking status: Former    Passive exposure: Current (Husband)   Smokeless tobacco: Never   Tobacco comments:    quit 22yrs ago  Vaping Use   Vaping status: Never Used  Substance and Sexual Activity   Alcohol use: No   Drug use: No   Sexual activity: Not Currently    Birth control/protection: Surgical  Other Topics Concern   Not on file  Social History Narrative   Not on file   Social Drivers of Health   Financial Resource Strain: Not on file  Food Insecurity: No Food Insecurity (01/21/2024)   [facility] from [facility]   Hunger Vital Sign    Within the past 12 months, you worried that your food would run out before you got the money to buy more.: Never true    Within the past 12 months, the food you bought just didn't last and you didn't have money to get more.: Never true  Transportation Needs: No Transportation Needs (01/21/2024)   Received from [facility] - Transportation    Lack of Transportation (Medical): No    Lack of Transportation (Non-Medical): No  Physical Activity: Not on file  Stress: No Stress Concern Present (01/21/2024)   Received from [facility] - Occupational Stress Questionnaire    Feeling of Stress : Not at all  Social Connections: Unknown (03/28/2022)   Received from [facility]   Social Network    Social Network: Not on file  Intimate Partner Violence: Not At Risk (01/21/2024)   Received from [facility]   HITS    Over the last 12 months how often did your partner physically hurt you?: Never    Over the last 12 months how often did your partner insult you or talk down to you?: Never    Over the last 12 months how often did your partner threaten you with physical harm?: Never    Over the last 12 months how often did your partner scream or curse at you?: Never   Family History:  Family History  Problem Relation Age of Onset   Prostate cancer Father    Deep vein thrombosis Father      Review of Systems: Constitutional: Doesn't report fevers, chills or abnormal weight loss Eyes: Doesn't report blurriness of vision Ears, nose, mouth, throat, and face: Doesn't report sore throat Respiratory: Doesn't report cough, dyspnea or wheezes Cardiovascular: Doesn't report palpitation, chest discomfort  Gastrointestinal:  Doesn't report nausea, constipation, diarrhea GU: Doesn't report incontinence Skin: Doesn't report skin rashes Neurological: Per HPI Musculoskeletal: Doesn't report joint pain Behavioral/Psych: Doesn't report anxiety  Physical Exam: Vitals:   06/17/24 1023  BP: (!) 139/91  Pulse: 93  Resp: 20  Temp: (!) 97.3 F (36.3 C)  SpO2: 95%   KPS: 70. General: Alert, cooperative, pleasant, in no acute distress Head: Normal EENT: No  conjunctival injection or scleral icterus.  Lungs: Resp effort normal Cardiac: Regular rate Abdomen: Non-distended abdomen Skin: No rashes cyanosis or petechiae. Extremities: No clubbing or edema  Neurologic Exam: Mental Status: Awake, alert, attentive to examiner. Oriented to self and environment. Language is fluent with intact comprehension.  Cranial Nerves: Visual acuity is grossly normal. Visual fields are full. Extra-ocular movements intact. No ptosis. Face is symmetric Motor: Tone and bulk are normal. Power 4+/5 in left arm and leg, noted fine motor impairment in left hand. Reflexes are symmetric, no pathologic reflexes present.  Sensory: Intact to light touch Gait: Hemiparetic  Labs: I have reviewed the data as listed    Component Value Date/Time   NA 142 12/12/2023 1104   K 4.5 12/12/2023 1104   CL 107 12/12/2023 1104   CO2 31 12/12/2023 1104   GLUCOSE 89 12/12/2023 1104   BUN 12 12/12/2023 1104   CREATININE 0.69 12/12/2023 1104   CALCIUM  10.0 12/12/2023 1104   PROT 7.6 12/12/2023 1104   ALBUMIN  4.4 12/12/2023 1104   AST 15 12/12/2023 1104   ALT 17 12/12/2023 1104   ALKPHOS 83 12/12/2023 1104   BILITOT  0.4 12/12/2023 1104   GFRNONAA >60 12/12/2023 1104   GFRAA >60 04/22/2020 0532   Lab Results  Component Value Date   WBC 16.0 (H) 04/22/2020   NEUTROABS 8.7 (H) 04/22/2020   HGB 11.8 (L) 04/22/2020   HCT 36.8 04/22/2020   MCV 93.9 04/22/2020   PLT 291 04/22/2020    Imaging:  [facility] Clinician Interpretation: I have personally reviewed the CNS images as listed.  My interpretation, in the context of the patient's clinical presentation, is stable disease  MR BRAIN W WO CONTRAST Result Date: 06/13/2024 EXAM: MRI BRAIN WITH AND WITHOUT CONTRAST 06/13/2024 01:19:14 PM TECHNIQUE: Multiplanar multisequence MRI of the head/brain was performed with and without the administration of intravenous contrast. COMPARISON: None available. CLINICAL HISTORY: Brain/CNS neoplasm, assess treatment response. SRS protocol; Prior head sx X 2014/2021; Numbness/weakness on left side; 7 ml Vueway ; 0.5 ml wasted FINDINGS: BRAIN AND VENTRICLES: No acute infarct. No midline shift. The ventricles are unremarkable. Basilar cisterns are patent. Posterior fossa is unremarkable. The previously noted region of enhancement over the right lateral convexity is decreased in size and appears more confluent on the current study. The focus of enhancement is less heterogeneous compared to prior. Previously noted region of leptomeningeal enhancement is significantly decreased with small residual focus seen on series 13 image 102. Additional leptomeningeal enhancement superiorly is similar in appearance on series 13 image 113. Additional region of right parafalcine extraaxial tumor is slightly decreased in size. Nodular region of tumor measures 1.9 x 1.5 cm, previously measuring 1.9 x 1.8 cm when measured in a similar manner on series 13 image 130. There is additional dural thickening and enhancement along the falx which is similar. Previously noted leptomeningeal enhancement along the parasagittal right frontal lobe is significantly decreased,  noted on series 15 image 27. Additional contiguous midline vertex enhancing tumor involving the superior sagittal sinus is decreased in size based on series 15 image 19 compared to prior series 15 image 21. Overall similar appearance of regional T2 and FLAIR hyperintensity within the posterior right frontal lobe extending into the parietal lobe near the vertex. No new or enlarging lesions. Similar hemosiderin deposition along the right frontal cortex. ORBITS: No acute abnormality. SINUSES: No acute abnormality. BONES AND SOFT TISSUES: Postsurgical changes of the right frontoparietal calvarium. Similar appearance of heterogeneous enhancement underlying the craniotomy site.  IMPRESSION: 1. Decreased size and more confluent appearance of enhancing tumor over the right lateral convexity with decreased leptomeningeal enhancement. 2. Slightly decreased size of the right parafalcine extra-axial tumor with decreased leptomeningeal enhancement. 3. Stable to slightly decreased size of the midline vertex enhancing tumor involving the superior sagittal sinus. 4. Similar appearance of regional T2/FLAIR hyperintensity and mass effect. 5. No new or enlarging lesions. Electronically signed by: Donnice Mania MD 06/13/2024 05:01 PM EDT RP Workstation: [id number]    Assessment/Plan Recurrent meningioma of the brain [facility])   [name] [name] is clinically stable today, with static left sided deficits.  MRI brain demonstrates improvement in recently progressive enhancing volume within falx, and leptomeningeal pattern of signal extension within right frontal site, both re-treated in February.    This is supportive of radiation treatment effect.  Recommended continuing imaging surveillance.  For focal symptoms, recommended trial of dexamethasone  1mg  daily given prior good response, ongoing inflammatory process.  She may discontinue this in 2-3 weeks, or continue on it.   Will otherwise continue Keppra  500mg  BID.  We ask that  [name] [name] return to clinic in 4 months with MRI brain for review, or sooner if needed.  All questions were answered. The patient knows to call the clinic with any problems, questions or concerns. No barriers to learning were detected.  The total time spent in the encounter was 40 minutes and more than 50% was on counseling and review of test results   Arthea MARLA Manns, MD Medical Director of Neuro-Oncology [facility] 06/17/24 12:30 PM

## 2024-06-18 ENCOUNTER — Other Ambulatory Visit: Payer: Self-pay | Admitting: Radiation Therapy

## 2024-06-19 ENCOUNTER — Telehealth: Payer: Self-pay | Admitting: Internal Medicine

## 2024-06-19 NOTE — Telephone Encounter (Signed)
 Scheduled appointment per 8/4 los. Talked with the patient and she is aware of the made appointment.

## 2024-06-24 ENCOUNTER — Telehealth: Payer: Self-pay | Admitting: *Deleted

## 2024-06-24 NOTE — Telephone Encounter (Signed)
 PC to patient, informed her Dr Buckley states it is ok for her to stop her dexamethasone , she does not have to taper down from 1 mg.  Instructed patient to call us  back with any further questions/concerns, she verbalizes understanding.

## 2024-06-24 NOTE — Telephone Encounter (Signed)
-----   Message from Arthea MARLA Manns sent at 06/24/2024  2:40 PM EDT ----- Yes totally fine to stop steroids ----- Message ----- From: Marget Rudell ORN, RN Sent: 06/24/2024  11:43 AM EDT To: Zachary K Vaslow, MD  Ms Estanislado called & said she is having some side effects from her dexamethasone  - she says the front of her head feels very tight and her L arm is very numb & heavy.  It makes me feel some type of way.  She states she would rather not take this if that's an option.

## 2024-07-10 ENCOUNTER — Other Ambulatory Visit: Payer: Self-pay | Admitting: *Deleted

## 2024-07-10 ENCOUNTER — Telehealth: Payer: Self-pay | Admitting: Internal Medicine

## 2024-07-10 ENCOUNTER — Telehealth: Payer: Self-pay | Admitting: *Deleted

## 2024-07-10 DIAGNOSIS — R569 Unspecified convulsions: Secondary | ICD-10-CM

## 2024-07-10 MED ORDER — LEVETIRACETAM 1000 MG PO TABS: 1000.0000 mg | ORAL_TABLET | Freq: Two times a day (BID) | ORAL | 1 refills | Status: DC

## 2024-07-10 NOTE — Telephone Encounter (Signed)
-----   Message from Zachary K Vaslow sent at 07/10/2024  2:11 PM EDT ----- Regarding: RE: Can increase to 1000mg  BID and then follow up in clinic in next few weeks ----- Message ----- From: Marget Rudell ORN, RN Sent: 07/10/2024   2:09 PM EDT To: Zachary K Vaslow, MD  Ms Duzan called today & is asking if she should increase her dose of Keppra .   She states she had a seizure this past weekend which was milder & only lasted approximately 1 1/2 minutes.  She felt nauseated, her breathing was labored, and her left arm was shaking. She says she has a tingling sensation in her head all the time which makes her feel like she's going to have a seizure. She reports she is taking Keppra  750 mg twice a day, however, your last note says she should be taking 500 mg twice a day.  Please advise.

## 2024-07-10 NOTE — Telephone Encounter (Signed)
 Returned PC to patient, informed her of Dr Eward recommendation - she is to increase her Keppra  to 1000 mg twice a day.  Dr Buckley wants to see her in the next few weeks to evaluate her response. She verbalizes understanding, scheduling message sent.

## 2024-07-10 NOTE — Telephone Encounter (Signed)
 Scheduled appointment per staff message. Talked with the patient and she is aware of the made appointment.

## 2024-07-12 ENCOUNTER — Ambulatory Visit: Payer: Medicare HMO | Attending: Vascular Surgery | Admitting: Physician Assistant

## 2024-07-12 ENCOUNTER — Ambulatory Visit (HOSPITAL_COMMUNITY)
Admission: RE | Admit: 2024-07-12 | Discharge: 2024-07-12 | Disposition: A | Discharge status: Home / Self Care | Payer: Medicare HMO | Source: Ambulatory Visit | Attending: Physician Assistant | Admitting: Physician Assistant

## 2024-07-12 VITALS — BP 120/80 | HR 79 | Temp 97.7°F | Wt 158.0 lb

## 2024-07-12 DIAGNOSIS — I6523 Occlusion and stenosis of bilateral carotid arteries: Secondary | ICD-10-CM | POA: Diagnosis not present

## 2024-07-12 DIAGNOSIS — I6529 Occlusion and stenosis of unspecified carotid artery: Secondary | ICD-10-CM | POA: Diagnosis not present

## 2024-07-18 ENCOUNTER — Other Ambulatory Visit: Payer: Self-pay | Admitting: *Deleted

## 2024-07-18 DIAGNOSIS — I6529 Occlusion and stenosis of unspecified carotid artery: Secondary | ICD-10-CM

## 2024-07-18 NOTE — Progress Notes (Signed)
 Office Note   History of Present Illness   [name] [name] is a 68 y.o. ([birth year]) female who presents for surveillance of carotid artery stenosis. She has a known history of fibromuscular dysplasia causing asymptomatic carotid artery stenosis. She also has a history of craniotomies in 2014 and 2021 for meningioma excision. After her surgeries she had issues with left-sided facial weakness and left arm and leg weakness.   She returns today for follow up. She is doing okay at today's visit. She continues to have some weakness and dysfunction of her left hand after her craniotomies and radiation therapy. Her neurologist tried to place her on Decadron  for her symptoms however she could not tolerate this medication. Other than her hand weakness, she denies any other neurological symptoms such as slurred speech, sudden weakness/numbness of the arms or legs, or sudden vision changes.   She takes a daily aspirin  and statin.   Current Outpatient Medications  Medication Sig Dispense Refill   acetaminophen  (TYLENOL ) 325 MG tablet Take 650 mg by mouth every 6 (six) hours as needed for moderate pain or headache.     aspirin  81 MG chewable tablet Chew 1 tablet (81 mg total) by mouth daily.     atorvastatin  (LIPITOR) 40 MG tablet Take 1 tablet (40 mg total) by mouth daily. 30 tablet 0   b complex vitamins capsule Take 1 capsule by mouth daily.     Cholecalciferol  (VITAMIN D3) 50 MCG (2000 UT) TABS Take 1 tablet by mouth daily. 30 tablet 0   dexamethasone  (DECADRON ) 1 MG tablet Take 1 tablet (1 mg total) by mouth daily with breakfast. 30 tablet 1   levETIRAcetam  (KEPPRA ) 1000 MG tablet Take 1 tablet (1,000 mg total) by mouth 2 (two) times daily. 60 tablet 1   metoprolol  succinate (TOPROL -XL) 25 MG 24 hr tablet Take 0.5 tablets (12.5 mg total) by mouth daily. 30 tablet 0   ondansetron  (ZOFRAN -ODT) 4 MG disintegrating tablet Take by mouth.     OVER THE COUNTER MEDICATION Take 1 tablet by mouth daily. Vit  B Stress Tablet     pantoprazole  (PROTONIX ) 40 MG tablet Take 40 mg by mouth daily.     potassium chloride  SA (K-DUR,KLOR-CON ) 20 MEQ tablet Take 20 mEq by mouth every other day.     No current facility-administered medications for this visit.    REVIEW OF SYSTEMS (negative unless checked):   Cardiac:  []  Chest pain or chest pressure? []  Shortness of breath upon activity? []  Shortness of breath when lying flat? []  Irregular heart rhythm?  Vascular:  []  Pain in calf, thigh, or hip brought on by walking? []  Pain in feet at night that wakes you up from your sleep? []  Blood clot in your veins? []  Leg swelling?  Pulmonary:  []  Oxygen at home? []  Productive cough? []  Wheezing?  Neurologic:  []  Sudden weakness in arms or legs? []  Sudden numbness in arms or legs? []  Sudden onset of difficult speaking or slurred speech? []  Temporary loss of vision in one eye? []  Problems with dizziness?  Gastrointestinal:  []  Blood in stool? []  Vomited blood?  Genitourinary:  []  Burning when urinating? []  Blood in urine?  Psychiatric:  []  Major depression  Hematologic:  []  Bleeding problems? []  Problems with blood clotting?  Dermatologic:  []  Rashes or ulcers?  Constitutional:  []  Fever or chills?  Ear/Nose/Throat:  []  Change in hearing? []  Nose bleeds? []  Sore throat?  Musculoskeletal:  []  Back pain? []  Joint pain? []  Muscle  pain?   Physical Examination   Vitals:   07/12/24 1220 07/12/24 1223  BP: 126/72 120/80  Pulse: 79   Temp: 97.7 F (36.5 C)   TempSrc: Temporal   Weight: 158 lb (71.7 kg)    Body mass index is 27.99 kg/m.  General:  WDWN in NAD; vital signs documented above Gait: Not observed HENT: WNL, normocephalic Pulmonary: normal non-labored breathing , without rales, rhonchi,  wheezing Cardiac: regular Abdomen: soft, NT, no masses Skin: without rashes Vascular Exam/Pulses: palpable radial pulses bilaterally Extremities: without ischemic changes,  without gangrene , without cellulitis; without open wounds;  Musculoskeletal: no muscle wasting or atrophy  Neurologic: A&O X 3;  No focal weakness or paresthesias are detected Psychiatric:  The pt has Normal affect.  Non-Invasive Vascular Imaging   Bilateral Carotid Duplex (07/12/2024):  R ICA stenosis:  40-59% R VA:  patent and antegrade L ICA stenosis:  60-79% L VA:  patent and antegrade   Medical Decision Making   [name] [name] is a 68 y.o. female who presents for surveillance of carotid artery stenosis  Based on the patient's vascular studies, her carotid artery stenosis is stable bilaterally. She has borderline 40-59% and 60-79% stenosis of the right carotid artery. She has 60-79% stenosis of the left carotid artery She continues to have left hand deficits after meningioma resection and SRS treatments. Otherwise she has no stroke like symptoms such as slurred speech, sudden weakness/numbness of the arms or legs, or sudden vision changes On exam she has some mild left hand weakness. She has palpable radial pulses bilaterally She will continue her daily aspirin  and statin and follow up with our office in 6 months with repeat carotid duplex   Ahmed Holster PA-C [facility] Office: [phone]  Clinic MD: [place]

## 2024-08-03 DIAGNOSIS — R200 Anesthesia of skin: Secondary | ICD-10-CM | POA: Diagnosis not present

## 2024-08-03 DIAGNOSIS — R253 Fasciculation: Secondary | ICD-10-CM | POA: Diagnosis not present

## 2024-08-03 DIAGNOSIS — R531 Weakness: Secondary | ICD-10-CM | POA: Diagnosis not present

## 2024-08-03 DIAGNOSIS — Z79899 Other long term (current) drug therapy: Secondary | ICD-10-CM | POA: Diagnosis not present

## 2024-08-03 DIAGNOSIS — R110 Nausea: Secondary | ICD-10-CM | POA: Diagnosis not present

## 2024-08-03 DIAGNOSIS — Z7982 Long term (current) use of aspirin: Secondary | ICD-10-CM | POA: Diagnosis not present

## 2024-08-06 DIAGNOSIS — Z1231 Encounter for screening mammogram for malignant neoplasm of breast: Secondary | ICD-10-CM | POA: Diagnosis not present

## 2024-08-08 ENCOUNTER — Inpatient Hospital Stay: Attending: Radiation Oncology | Admitting: Internal Medicine

## 2024-08-08 VITALS — BP 134/69 | HR 82 | Temp 98.2°F | Resp 16 | Ht 63.0 in | Wt 159.5 lb

## 2024-08-08 DIAGNOSIS — R569 Unspecified convulsions: Secondary | ICD-10-CM

## 2024-08-08 DIAGNOSIS — Z7982 Long term (current) use of aspirin: Secondary | ICD-10-CM | POA: Diagnosis not present

## 2024-08-08 DIAGNOSIS — Z8673 Personal history of transient ischemic attack (TIA), and cerebral infarction without residual deficits: Secondary | ICD-10-CM | POA: Insufficient documentation

## 2024-08-08 DIAGNOSIS — E785 Hyperlipidemia, unspecified: Secondary | ICD-10-CM | POA: Diagnosis not present

## 2024-08-08 DIAGNOSIS — Z90710 Acquired absence of both cervix and uterus: Secondary | ICD-10-CM | POA: Insufficient documentation

## 2024-08-08 DIAGNOSIS — Z7952 Long term (current) use of systemic steroids: Secondary | ICD-10-CM | POA: Insufficient documentation

## 2024-08-08 DIAGNOSIS — G40909 Epilepsy, unspecified, not intractable, without status epilepticus: Secondary | ICD-10-CM | POA: Diagnosis not present

## 2024-08-08 DIAGNOSIS — Z79899 Other long term (current) drug therapy: Secondary | ICD-10-CM | POA: Diagnosis not present

## 2024-08-08 DIAGNOSIS — Z87891 Personal history of nicotine dependence: Secondary | ICD-10-CM | POA: Diagnosis not present

## 2024-08-08 DIAGNOSIS — D320 Benign neoplasm of cerebral meninges: Secondary | ICD-10-CM | POA: Diagnosis not present

## 2024-08-08 MED ORDER — ONDANSETRON HCL 8 MG PO TABS: 8.0000 mg | ORAL_TABLET | Freq: Three times a day (TID) | ORAL | 0 refills | Status: AC | PRN

## 2024-08-08 NOTE — Progress Notes (Signed)
 [facility] at [facility] [address]  [place], [place] [address] [phone]   Interval Evaluation  Date of Service: 08/08/24 Patient Name: [name] [name] Patient MRN: [id number] Patient DOB: 1956/09/06 Provider: Arthea MARLA Manns, MD  Identifying Statement:  [name] [name] is a 68 y.o. female with multifocal meningioma  Oncologic History: 09/10/13: Craniotomy, resection with Dr. Lanis. 04/21/20: R frontal and falcine recurrence, repeat resection debulkeing with Nundkumar 05/22/20: SRS to residual tumor along falx and sinus with Dr. Patrcia 01/10/24: Salvage SRS to both treated meningiomas for ongoing progression Valene)  Interval History: [name] [name] presents today for follow up after recent breakthrough seizure.  She describes episode several weeks ago, characterized by left arm and leg shaking, head turning to the left for 2-3 minutes.  Maintained awareness throughout, felt weak for several hours afterwards.  Keppra  was increased via phone to 1000mg  twice per day.  She has been doing well with that dose level, no further seizure events or side effects.  Continues to have some numbness and weakness on the left side as prior.  Prior(06/17/24) She continues to describe mild weakness and dysfunction with her left hand, no longer dosing any decadron .  She has difficulty using the hand for specific tasks like tying shoes.  She did feel better overall when on the decadron  1mg  daily . No issues with the arm or leg otherwise. Keppra  remains at 500mg  twice per day after poor tolerance of vimpat .  Otherwise no breakthrough seizures.  Denies headaches.  Medications: Current Outpatient Medications on File Prior to Visit  Medication Sig Dispense Refill   acetaminophen  (TYLENOL ) 325 MG tablet Take 650 mg by mouth every 6 (six) hours as needed for moderate pain or headache.     aspirin  81 MG chewable tablet Chew 1 tablet (81 mg total) by mouth daily.      atorvastatin  (LIPITOR) 40 MG tablet Take 1 tablet (40 mg total) by mouth daily. 30 tablet 0   b complex vitamins capsule Take 1 capsule by mouth daily.     Cholecalciferol  (VITAMIN D3) 50 MCG (2000 UT) TABS Take 1 tablet by mouth daily. 30 tablet 0   levETIRAcetam  (KEPPRA ) 1000 MG tablet Take 1 tablet (1,000 mg total) by mouth 2 (two) times daily. 60 tablet 1   metoprolol  succinate (TOPROL -XL) 25 MG 24 hr tablet Take 0.5 tablets (12.5 mg total) by mouth daily. 30 tablet 0   ondansetron  (ZOFRAN -ODT) 4 MG disintegrating tablet Take by mouth.     OVER THE COUNTER MEDICATION Take 1 tablet by mouth daily. Vit B Stress Tablet     pantoprazole  (PROTONIX ) 40 MG tablet Take 40 mg by mouth daily.     potassium chloride  SA (K-DUR,KLOR-CON ) 20 MEQ tablet Take 20 mEq by mouth every other day.     No current facility-administered medications on file prior to visit.    Allergies:  Allergies  Allergen Reactions   Adhesive [Tape]     Rash popped up after using bandaids, telemetry stickers.   Past Medical History:  Past Medical History:  Diagnosis Date   Bruises easily    Carotid artery occlusion    Dizziness    Headache(784.0)    Hyperlipidemia    takes Atorvastatin  daily   PONV (postoperative nausea and vomiting)    Stroke (HCC)    TIA (transient ischemic attack)    Past Surgical History:  Past Surgical History:  Procedure Laterality Date   ABDOMINAL HYSTERECTOMY  2002   APPLICATION OF CRANIAL  NAVIGATION N/A 04/16/2020   Procedure: APPLICATION OF CRANIAL NAVIGATION;  Surgeon: Lanis Pupa, MD;  Location: [facility] OR;  Service: Neurosurgery;  Laterality: N/A;   COLONOSCOPY     CRANIOTOMY Right 09/10/2013   Procedure: Craniotomy for tumor excision;  Surgeon: Pupa Lanis, MD;  Location: [facility] NEURO ORS;  Service: Neurosurgery;  Laterality: Right;  Craniotomy for meningioma with stealth   CRANIOTOMY N/A 04/16/2020   Procedure: STEREOTACTIC CRANIOTOMY FOR RESECTION OF MENINGIOMA;  Surgeon:  Lanis Pupa, MD;  Location: [facility] OR;  Service: Neurosurgery;  Laterality: N/A;   DILATION AND CURETTAGE OF UTERUS  1980   left hand surgery  2007 and 2008   plates and screws   Social History:  Social History   Socioeconomic History   Marital status: Married    Spouse name: Not on file   Number of children: Not on file   Years of education: Not on file   Highest education level: Not on file  Occupational History   Not on file  Tobacco Use   Smoking status: Former    Passive exposure: Current (Husband)   Smokeless tobacco: Never   Tobacco comments:    quit 17yrs ago  Vaping Use   Vaping status: Never Used  Substance and Sexual Activity   Alcohol use: No   Drug use: No   Sexual activity: Not Currently    Birth control/protection: Surgical  Other Topics Concern   Not on file  Social History Narrative   Not on file   Social Drivers of Health   Financial Resource Strain: Not on file  Food Insecurity: No Food Insecurity (01/21/2024)   Received from [facility]   Hunger Vital Sign    Within the past 12 months, you worried that your food would run out before you got the money to buy more.: Never true    Within the past 12 months, the food you bought just didn't last and you didn't have money to get more.: Never true  Transportation Needs: No Transportation Needs (01/21/2024)   Received from [facility] - Transportation    Lack of Transportation (Medical): No    Lack of Transportation (Non-Medical): No  Physical Activity: Not on file  Stress: No Stress Concern Present (01/21/2024)   Received from [facility] - Occupational Stress Questionnaire    Feeling of Stress : Not at all  Social Connections: Unknown (03/28/2022)   Received from [facility]   Social Network    Social Network: Not on file  Intimate Partner Violence: Not At Risk (01/21/2024)   Received from [facility]   HITS    Over the last 12 months how  often did your partner physically hurt you?: Never    Over the last 12 months how often did your partner insult you or talk down to you?: Never    Over the last 12 months how often did your partner threaten you with physical harm?: Never    Over the last 12 months how often did your partner scream or curse at you?: Never   Family History:  Family History  Problem Relation Age of Onset   Prostate cancer Father    Deep vein thrombosis Father     Review of Systems: Constitutional: Doesn't report fevers, chills or abnormal weight loss Eyes: Doesn't report blurriness of vision Ears, nose, mouth, throat, and face: Doesn't report sore throat Respiratory: Doesn't report cough, dyspnea or wheezes Cardiovascular: Doesn't report palpitation, chest  discomfort  Gastrointestinal:  Doesn't report nausea, constipation, diarrhea GU: Doesn't report incontinence Skin: Doesn't report skin rashes Neurological: Per HPI Musculoskeletal: Doesn't report joint pain Behavioral/Psych: Doesn't report anxiety  Physical Exam: Vitals:   08/08/24 1047  BP: 134/69  Pulse: 82  Resp: 16  Temp: 98.2 F (36.8 C)  SpO2: 98%   KPS: 70. General: Alert, cooperative, pleasant, in no acute distress Head: Normal EENT: No conjunctival injection or scleral icterus.  Lungs: Resp effort normal Cardiac: Regular rate Abdomen: Non-distended abdomen Skin: No rashes cyanosis or petechiae. Extremities: No clubbing or edema  Neurologic Exam: Mental Status: Awake, alert, attentive to examiner. Oriented to self and environment. Language is fluent with intact comprehension.  Cranial Nerves: Visual acuity is grossly normal. Visual fields are full. Extra-ocular movements intact. No ptosis. Face is symmetric Motor: Tone and bulk are normal. Power 4+/5 in left arm and leg, noted fine motor impairment in left hand. Reflexes are symmetric, no pathologic reflexes present.  Sensory: Impaired left arm/hand Gait:  Hemiparetic  Labs: I have reviewed the data as listed    Component Value Date/Time   NA 142 12/12/2023 1104   K 4.5 12/12/2023 1104   CL 107 12/12/2023 1104   CO2 31 12/12/2023 1104   GLUCOSE 89 12/12/2023 1104   BUN 12 12/12/2023 1104   CREATININE 0.69 12/12/2023 1104   CALCIUM  10.0 12/12/2023 1104   PROT 7.6 12/12/2023 1104   ALBUMIN  4.4 12/12/2023 1104   AST 15 12/12/2023 1104   ALT 17 12/12/2023 1104   ALKPHOS 83 12/12/2023 1104   BILITOT 0.4 12/12/2023 1104   GFRNONAA >60 12/12/2023 1104   GFRAA >60 04/22/2020 0532   Lab Results  Component Value Date   WBC 16.0 (H) 04/22/2020   NEUTROABS 8.7 (H) 04/22/2020   HGB 11.8 (L) 04/22/2020   HCT 36.8 04/22/2020   MCV 93.9 04/22/2020   PLT 291 04/22/2020    Imaging:  [facility] Clinician Interpretation: I have personally reviewed the CNS images as listed.  My interpretation, in the context of the patient's clinical presentation, is stable disease  VAS US  CAROTID Result Date: 07/13/2024 Carotid Arterial Duplex Study Patient Name:  [name] [name]  Date of Exam:   07/12/2024 Medical Rec #: [id number]        Accession #:    [id number] Date of Birth: [birth year]        Patient Gender: F Patient Age:   67 years Exam Location:  [address] Procedure:      VAS US  CAROTID Referring Phys: [employer] COLLINS --------------------------------------------------------------------------------  Indications:  Carotid artery disease. Risk Factors: Hyperlipidemia, prior CVA. Performing Technologist: Devere Dark RVT  Examination Guidelines: A complete evaluation includes B-mode imaging, spectral Doppler, color Doppler, and power Doppler as needed of all accessible portions of each vessel. Bilateral testing is considered an integral part of a complete examination. Limited examinations for reoccurring indications may be performed as noted.  Right Carotid Findings: +----------+--------+--------+--------+------------------+--------+           PSV cm/sEDV  cm/sStenosisPlaque DescriptionComments +----------+--------+--------+--------+------------------+--------+ CCA Prox  116     12                                         +----------+--------+--------+--------+------------------+--------+ CCA Mid   124     14                                         +----------+--------+--------+--------+------------------+--------+  CCA Distal91      21                                         +----------+--------+--------+--------+------------------+--------+ ICA Prox  199     60      40-59%                    tortuous +----------+--------+--------+--------+------------------+--------+ ICA Mid   138     37                                         +----------+--------+--------+--------+------------------+--------+ ICA Distal116     31                                         +----------+--------+--------+--------+------------------+--------+ ECA       67      9                                          +----------+--------+--------+--------+------------------+--------+ +----------+--------+-------+--------+-------------------+           PSV cm/sEDV cmsDescribeArm Pressure (mmHG) +----------+--------+-------+--------+-------------------+ Subclavian184     0              120                 +----------+--------+-------+--------+-------------------+ +---------+--------+--+--------+--+---------+ VertebralPSV cm/s57EDV cm/s18Antegrade +---------+--------+--+--------+--+---------+  Left Carotid Findings: +----------+--------+--------+--------+------------------+---------------------+           PSV cm/sEDV cm/sStenosisPlaque DescriptionComments              +----------+--------+--------+--------+------------------+---------------------+ CCA Prox  122     19                                                      +----------+--------+--------+--------+------------------+---------------------+ CCA Mid   97      23                                                       +----------+--------+--------+--------+------------------+---------------------+ CCA Distal69      19                                                      +----------+--------+--------+--------+------------------+---------------------+ ICA Prox  79      33                                                      +----------+--------+--------+--------+------------------+---------------------+ ICA Mid   247     70  60-79%                    tortuous , visualized                                                     plaque                +----------+--------+--------+--------+------------------+---------------------+ ICA Distal137     52                                                      +----------+--------+--------+--------+------------------+---------------------+ ECA       72      11                                                      +----------+--------+--------+--------+------------------+---------------------+ +----------+--------+--------+----------------+-------------------+           PSV cm/sEDV cm/sDescribe        Arm Pressure (mmHG) +----------+--------+--------+----------------+-------------------+ [id number]     2       Multiphasic, [id number]                 +----------+--------+--------+----------------+-------------------+ +---------+--------+--+--------+--+---------+ VertebralPSV cm/s96EDV cm/s25Antegrade +---------+--------+--+--------+--+---------+   Summary: Right Carotid: Velocities in the right ICA are consistent with a 40-59%                stenosis. Tortuous proximal to mid ICA noted, may account for                increased velocity. Left Carotid: Velocities in the left ICA are consistent with a 60-79% stenosis. Vertebrals:  Bilateral vertebral arteries demonstrate antegrade flow. Subclavians: Normal flow hemodynamics were seen in bilateral subclavian              arteries.                Further imaging modality may be warranted. *See table(s) above for measurements and observations.  Electronically signed by Debby Robertson on 07/13/2024 at 12:18:57 PM.    Final     Assessment/Plan Recurrent meningioma of the brain [facility])  Seizure (HCC)   [name] [name] is clinically stable today, with unprovoked breakthrough seizure noted in late August.     No recurrence of seizures on increased dose of Keppra  1000mg  BID.  Will recommend she continue this dose for now.  She asked for prn zofran  for nausea today, we were agreeable to fill this.  We ask that Alexandria Shiflett return to clinic in 2-3 months with MRI brain for review, or sooner if needed.  All questions were answered. The patient knows to call the clinic with any problems, questions or concerns. No barriers to learning were detected.  The total time spent in the encounter was 30 minutes and more than 50% was on counseling and review of test results   Arthea MARLA Manns, MD Medical Director of Neuro-Oncology [facility] 08/08/24 10:59 AM

## 2024-08-12 DIAGNOSIS — E876 Hypokalemia: Secondary | ICD-10-CM | POA: Diagnosis not present

## 2024-08-12 DIAGNOSIS — E7849 Other hyperlipidemia: Secondary | ICD-10-CM | POA: Diagnosis not present

## 2024-08-12 DIAGNOSIS — R5383 Other fatigue: Secondary | ICD-10-CM | POA: Diagnosis not present

## 2024-08-12 DIAGNOSIS — E559 Vitamin D deficiency, unspecified: Secondary | ICD-10-CM | POA: Diagnosis not present

## 2024-08-12 DIAGNOSIS — E538 Deficiency of other specified B group vitamins: Secondary | ICD-10-CM | POA: Diagnosis not present

## 2024-08-12 DIAGNOSIS — Z1329 Encounter for screening for other suspected endocrine disorder: Secondary | ICD-10-CM | POA: Diagnosis not present

## 2024-08-12 DIAGNOSIS — Z131 Encounter for screening for diabetes mellitus: Secondary | ICD-10-CM | POA: Diagnosis not present

## 2024-08-20 DIAGNOSIS — Z23 Encounter for immunization: Secondary | ICD-10-CM | POA: Diagnosis not present

## 2024-08-20 DIAGNOSIS — G8194 Hemiplegia, unspecified affecting left nondominant side: Secondary | ICD-10-CM | POA: Diagnosis not present

## 2024-08-20 DIAGNOSIS — Z6828 Body mass index (BMI) 28.0-28.9, adult: Secondary | ICD-10-CM | POA: Diagnosis not present

## 2024-08-20 DIAGNOSIS — Z1331 Encounter for screening for depression: Secondary | ICD-10-CM | POA: Diagnosis not present

## 2024-08-20 DIAGNOSIS — E7849 Other hyperlipidemia: Secondary | ICD-10-CM | POA: Diagnosis not present

## 2024-08-20 DIAGNOSIS — Z0001 Encounter for general adult medical examination with abnormal findings: Secondary | ICD-10-CM | POA: Diagnosis not present

## 2024-08-20 DIAGNOSIS — R4582 Worries: Secondary | ICD-10-CM | POA: Diagnosis not present

## 2024-08-20 DIAGNOSIS — D320 Benign neoplasm of cerebral meninges: Secondary | ICD-10-CM | POA: Diagnosis not present

## 2024-09-01 ENCOUNTER — Other Ambulatory Visit: Payer: Self-pay | Admitting: Internal Medicine

## 2024-09-01 DIAGNOSIS — R569 Unspecified convulsions: Secondary | ICD-10-CM

## 2024-09-29 ENCOUNTER — Other Ambulatory Visit: Payer: Self-pay | Admitting: Internal Medicine

## 2024-09-29 DIAGNOSIS — R569 Unspecified convulsions: Secondary | ICD-10-CM

## 2024-10-12 DIAGNOSIS — Z20828 Contact with and (suspected) exposure to other viral communicable diseases: Secondary | ICD-10-CM | POA: Diagnosis not present

## 2024-10-12 DIAGNOSIS — J019 Acute sinusitis, unspecified: Secondary | ICD-10-CM | POA: Diagnosis not present

## 2024-10-12 DIAGNOSIS — Z6828 Body mass index (BMI) 28.0-28.9, adult: Secondary | ICD-10-CM | POA: Diagnosis not present

## 2024-10-15 ENCOUNTER — Other Ambulatory Visit: Payer: Self-pay | Admitting: Internal Medicine

## 2024-10-18 ENCOUNTER — Ambulatory Visit
Admission: RE | Admit: 2024-10-18 | Discharge: 2024-10-18 | Disposition: A | Discharge status: Home / Self Care | Source: Ambulatory Visit | Attending: Internal Medicine | Admitting: Internal Medicine

## 2024-10-18 DIAGNOSIS — G9389 Other specified disorders of brain: Secondary | ICD-10-CM | POA: Diagnosis not present

## 2024-10-18 DIAGNOSIS — D320 Benign neoplasm of cerebral meninges: Secondary | ICD-10-CM

## 2024-10-18 MED ORDER — GADOPICLENOL 0.5 MMOL/ML IV SOLN
7.0000 mL | Freq: Once | INTRAVENOUS | Status: AC | PRN
  Administered 2024-10-18: 7 mL via INTRAVENOUS

## 2024-10-21 ENCOUNTER — Inpatient Hospital Stay

## 2024-10-24 ENCOUNTER — Inpatient Hospital Stay: Attending: Radiation Oncology | Admitting: Internal Medicine

## 2024-10-24 VITALS — BP 135/70 | HR 95 | Temp 97.7°F | Resp 17 | Ht 63.0 in | Wt 163.0 lb

## 2024-10-24 DIAGNOSIS — Z87891 Personal history of nicotine dependence: Secondary | ICD-10-CM | POA: Insufficient documentation

## 2024-10-24 DIAGNOSIS — E785 Hyperlipidemia, unspecified: Secondary | ICD-10-CM | POA: Insufficient documentation

## 2024-10-24 DIAGNOSIS — Z7952 Long term (current) use of systemic steroids: Secondary | ICD-10-CM | POA: Diagnosis not present

## 2024-10-24 DIAGNOSIS — D320 Benign neoplasm of cerebral meninges: Secondary | ICD-10-CM | POA: Diagnosis present

## 2024-10-24 DIAGNOSIS — Z91048 Other nonmedicinal substance allergy status: Secondary | ICD-10-CM | POA: Insufficient documentation

## 2024-10-24 DIAGNOSIS — Z9889 Other specified postprocedural states: Secondary | ICD-10-CM | POA: Diagnosis not present

## 2024-10-24 DIAGNOSIS — Z7982 Long term (current) use of aspirin: Secondary | ICD-10-CM | POA: Diagnosis not present

## 2024-10-24 DIAGNOSIS — F32A Depression, unspecified: Secondary | ICD-10-CM | POA: Insufficient documentation

## 2024-10-24 DIAGNOSIS — R569 Unspecified convulsions: Secondary | ICD-10-CM | POA: Diagnosis not present

## 2024-10-24 DIAGNOSIS — Z86011 Personal history of benign neoplasm of the brain: Secondary | ICD-10-CM | POA: Insufficient documentation

## 2024-10-24 DIAGNOSIS — Z8673 Personal history of transient ischemic attack (TIA), and cerebral infarction without residual deficits: Secondary | ICD-10-CM | POA: Insufficient documentation

## 2024-10-24 DIAGNOSIS — Z90710 Acquired absence of both cervix and uterus: Secondary | ICD-10-CM | POA: Diagnosis not present

## 2024-10-24 DIAGNOSIS — Z79899 Other long term (current) drug therapy: Secondary | ICD-10-CM | POA: Insufficient documentation

## 2024-10-24 NOTE — Progress Notes (Signed)
 [facility] at [facility] [address]  [place], [place] [address] [phone]   Interval Evaluation  Date of Service: 10/24/2024 Patient Name: [name] [name] Patient MRN: [id number] Patient DOB: September 22, 1956 Provider: Arthea MARLA Manns, MD  Identifying Statement:  [name] [name] is a 68 y.o. female with multifocal meningioma  Oncologic History: 09/10/13: Craniotomy, resection with Dr. Lanis. 04/21/20: R frontal and falcine recurrence, repeat resection debulkeing with Nundkumar 05/22/20: SRS to residual tumor along falx and sinus with Dr. Patrcia 01/10/24: Salvage SRS to both treated meningiomas for ongoing progression Valene)  Interval History: [name] [name] presents today for follow up after recent MRI brain. No further seizures.  No clinical changes described today.  Keppra  has continued 1000mg  twice per day.  Continues to have some numbness and weakness on the left side as prior.  Denies headaches.  Prior(06/17/24) She continues to describe mild weakness and dysfunction with her left hand, no longer dosing any decadron .  She has difficulty using the hand for specific tasks like tying shoes.  She did feel better overall when on the decadron  1mg  daily . No issues with the arm or leg otherwise. Keppra  remains at 500mg  twice per day after poor tolerance of vimpat .  Otherwise no breakthrough seizures.  Denies headaches.  Medications: Current Outpatient Medications on File Prior to Visit  Medication Sig Dispense Refill   acetaminophen  (TYLENOL ) 325 MG tablet Take 650 mg by mouth every 6 (six) hours as needed for moderate pain or headache.     aspirin  81 MG chewable tablet Chew 1 tablet (81 mg total) by mouth daily.     atorvastatin  (LIPITOR) 40 MG tablet Take 1 tablet (40 mg total) by mouth daily. 30 tablet 0   b complex vitamins capsule Take 1 capsule by mouth daily.     Cholecalciferol  (VITAMIN D3) 50 MCG (2000 UT) TABS Take 1 tablet by mouth daily. 30  tablet 0   levETIRAcetam  (KEPPRA ) 1000 MG tablet Take 1 tablet by mouth twice daily 60 tablet 0   metoprolol  succinate (TOPROL -XL) 25 MG 24 hr tablet Take 0.5 tablets (12.5 mg total) by mouth daily. 30 tablet 0   ondansetron  (ZOFRAN ) 8 MG tablet Take 1 tablet (8 mg total) by mouth every 8 (eight) hours as needed for nausea or vomiting. 30 tablet 0   OVER THE COUNTER MEDICATION Take 1 tablet by mouth daily. Vit B Stress Tablet     pantoprazole  (PROTONIX ) 40 MG tablet Take 40 mg by mouth daily.     potassium chloride  SA (K-DUR,KLOR-CON ) 20 MEQ tablet Take 20 mEq by mouth every other day.     traZODone (DESYREL) 50 MG tablet Take 25 mg by mouth at bedtime as needed for sleep.     No current facility-administered medications on file prior to visit.    Allergies:  Allergies  Allergen Reactions   Adhesive [Tape]     Rash popped up after using bandaids, telemetry stickers.   Past Medical History:  Past Medical History:  Diagnosis Date   Bruises easily    Carotid artery occlusion    Dizziness    Headache(784.0)    Hyperlipidemia    takes Atorvastatin  daily   PONV (postoperative nausea and vomiting)    Stroke (HCC)    TIA (transient ischemic attack)    Past Surgical History:  Past Surgical History:  Procedure Laterality Date   ABDOMINAL HYSTERECTOMY  2002   APPLICATION OF CRANIAL NAVIGATION N/A 04/16/2020   Procedure: APPLICATION OF CRANIAL NAVIGATION;  Surgeon: Lanis Pupa, MD;  Location: [facility] OR;  Service: Neurosurgery;  Laterality: N/A;   COLONOSCOPY     CRANIOTOMY Right 09/10/2013   Procedure: Craniotomy for tumor excision;  Surgeon: Pupa Lanis, MD;  Location: [facility] NEURO ORS;  Service: Neurosurgery;  Laterality: Right;  Craniotomy for meningioma with stealth   CRANIOTOMY N/A 04/16/2020   Procedure: STEREOTACTIC CRANIOTOMY FOR RESECTION OF MENINGIOMA;  Surgeon: Lanis Pupa, MD;  Location: [facility] OR;  Service: Neurosurgery;  Laterality: N/A;   DILATION AND CURETTAGE OF  UTERUS  1980   left hand surgery  2007 and 2008   plates and screws   Social History:  Social History   Socioeconomic History   Marital status: Married    Spouse name: Not on file   Number of children: Not on file   Years of education: Not on file   Highest education level: Not on file  Occupational History   Not on file  Tobacco Use   Smoking status: Former    Passive exposure: Current (Husband)   Smokeless tobacco: Never   Tobacco comments:    quit 68yrs ago  Vaping Use   Vaping status: Never Used  Substance and Sexual Activity   Alcohol use: No   Drug use: No   Sexual activity: Not Currently    Birth control/protection: Surgical  Other Topics Concern   Not on file  Social History Narrative   Not on file   Social Drivers of Health   Tobacco Use: Low Risk (08/03/2024)   Received from [facility]   Patient History    Smoking Tobacco Use: Never    Smokeless Tobacco Use: Never    Passive Exposure: Not on file  Recent Concern: Tobacco Use - Medium Risk (07/12/2024)   Patient History    Smoking Tobacco Use: Former    Smokeless Tobacco Use: Never    Passive Exposure: Current  Physicist, Medical Strain: Not on file  Food Insecurity: No Food Insecurity (01/21/2024)   Received from [facility]   Epic    Within the past 12 months, you worried that your food would run out before you got the money to buy more.: Never true    Within the past 12 months, the food you bought just didn't last and you didn't have money to get more.: Never true  Transportation Needs: No Transportation Needs (01/21/2024)   Received from [facility] - Transportation    Lack of Transportation (Medical): No    Lack of Transportation (Non-Medical): No  Physical Activity: Not on file  Stress: No Stress Concern Present (01/21/2024)   Received from [facility] - Occupational Stress Questionnaire    Feeling of Stress : Not at all  Social  Connections: Unknown (03/28/2022)   Received from [facility]   Social Network    Social Network: Not on file  Intimate Partner Violence: Not At Risk (01/21/2024)   Received from [facility]   HITS    Over the last 12 months how often did your partner physically hurt you?: Never    Over the last 12 months how often did your partner insult you or talk down to you?: Never    Over the last 12 months how often did your partner threaten you with physical harm?: Never    Over the last 12 months how often did your partner scream or curse at you?: Never  Depression (PHQ2-9): Low Risk (08/08/2024)   Depression (PHQ2-9)  PHQ-2 Score: 0  Alcohol Screen: Not on file  Housing: Low Risk (01/21/2024)   [facility] from [facility]    In the last 12 months, was there a time when you were not able to pay the mortgage or rent on time?: No    In the past 12 months, how many times have you moved where you were living?: 1    At any time in the past 12 months, were you homeless or living in a shelter (including now)?: No  Utilities: Not At Risk (01/21/2024)   Received from [facility] Utilities    Threatened with loss of utilities: No  Health Literacy: Not on file   Family History:  Family History  Problem Relation Age of Onset   Prostate cancer Father    Deep vein thrombosis Father     Review of Systems: Constitutional: Doesn't report fevers, chills or abnormal weight loss Eyes: Doesn't report blurriness of vision Ears, nose, mouth, throat, and face: Doesn't report sore throat Respiratory: Doesn't report cough, dyspnea or wheezes Cardiovascular: Doesn't report palpitation, chest discomfort  Gastrointestinal:  Doesn't report nausea, constipation, diarrhea GU: Doesn't report incontinence Skin: Doesn't report skin rashes Neurological: Per HPI Musculoskeletal: Doesn't report joint pain Behavioral/Psych: Doesn't report anxiety  Physical Exam: Vitals:   10/24/24 1112 10/24/24 1114   BP: (!) 149/81 135/70  Pulse: 95   Resp: 17   Temp: 97.7 F (36.5 C)   SpO2: 97%    KPS: 70. General: Alert, cooperative, pleasant, in no acute distress Head: Normal EENT: No conjunctival injection or scleral icterus.  Lungs: Resp effort normal Cardiac: Regular rate Abdomen: Non-distended abdomen Skin: No rashes cyanosis or petechiae. Extremities: No clubbing or edema  Neurologic Exam: Mental Status: Awake, alert, attentive to examiner. Oriented to self and environment. Language is fluent with intact comprehension.  Cranial Nerves: Visual acuity is grossly normal. Visual fields are full. Extra-ocular movements intact. No ptosis. Face is symmetric Motor: Tone and bulk are normal. Power 4+/5 in left arm and leg, noted fine motor impairment in left hand. Reflexes are symmetric, no pathologic reflexes present.  Sensory: Impaired left arm/hand Gait: Hemiparetic  Labs: I have reviewed the data as listed    Component Value Date/Time   NA 142 12/12/2023 1104   K 4.5 12/12/2023 1104   CL 107 12/12/2023 1104   CO2 31 12/12/2023 1104   GLUCOSE 89 12/12/2023 1104   BUN 12 12/12/2023 1104   CREATININE 0.69 12/12/2023 1104   CALCIUM  10.0 12/12/2023 1104   PROT 7.6 12/12/2023 1104   ALBUMIN  4.4 12/12/2023 1104   AST 15 12/12/2023 1104   ALT 17 12/12/2023 1104   ALKPHOS 83 12/12/2023 1104   BILITOT 0.4 12/12/2023 1104   GFRNONAA >60 12/12/2023 1104   GFRAA >60 04/22/2020 0532   Lab Results  Component Value Date   WBC 16.0 (H) 04/22/2020   NEUTROABS 8.7 (H) 04/22/2020   HGB 11.8 (L) 04/22/2020   HCT 36.8 04/22/2020   MCV 93.9 04/22/2020   PLT 291 04/22/2020    Imaging:  [facility] Clinician Interpretation: I have personally reviewed the CNS images as listed.  My interpretation, in the context of the patient's clinical presentation, is stable disease  MR BRAIN W WO CONTRAST Result Date: 10/18/2024 EXAM: MRI BRAIN WITH AND WITHOUT CONTRAST 10/18/2024 12:43:12 PM TECHNIQUE:  Multiplanar multisequence MRI of the head/brain was performed with and without the administration of intravenous contrast. COMPARISON: MRI of the head dated 06/13/2024.  CLINICAL HISTORY: Brain/CNS neoplasm, assess treatment response. FINDINGS: BRAIN AND VENTRICLES: No acute infarct. No acute intracranial hemorrhage. No mass effect or midline shift. No hydrocephalus. The sella is unremarkable. Normal flow voids. Mild interval decrease in size of plaque-like dural thickening/enhancement overlying the right posterior frontal lobe. The dural enhancement has decreased in thickness from approximately 8 mm to 7 mm. There is also less associated leptomeningeal disease extending into the frontal sulci. A polypoid peripheral enhancing mass has also decreased in size in the interim from approximately 19 x 15 x 13 mm to approximately 15 x 12 x 11 mm. Enhancing tumor involving the superior sagittal sinus at the skull vertex is unchanged in the interim. There are encephalomalacia changes and gliosis again demonstrated within the right posterior frontal lobe, which appears unchanged in the interim. ORBITS: No acute abnormality. SINUSES: No acute abnormality. BONES AND SOFT TISSUES: Normal bone marrow signal and enhancement. No acute soft tissue abnormality. IMPRESSION: 1. Mild interval decrease in size of plaque-like dural thickening/enhancement overlying the right posterior frontal lobe, with associated improving leptomeningeal disease extending into the frontal sulci. 2. Polypoid peripheral polypoid mass has  also decreased in size in the interim. 3. Enhancing tumor involving the superior sagittal sinus at the skull vertex is unchanged in the interim. 4. Encephalomalacia changes and gliosis within the right posterior frontal lobe, unchanged in the interim. Electronically signed by: Evalene Coho MD 10/18/2024 01:57 PM EST RP Workstation: [id number]    Assessment/Plan Recurrent meningioma of the brain [facility])  Seizure  (HCC)   [name] [name] is clinically stable today, with ongoing sensory impairments on left side, with milder motor symptoms.     No recurrence of seizures on increased dose of Keppra  1000mg  BID.  Will recommend she continue this dose for now.  She asked for prn zofran  for nausea today, we were agreeable to fill this.  We ask that Latoya Maulding return to clinic in 4 months with MRI brain for review, or sooner if needed.  All questions were answered. The patient knows to call the clinic with any problems, questions or concerns. No barriers to learning were detected.  The total time spent in the encounter was 40 minutes and more than 50% was on counseling and review of test results   Arthea MARLA Manns, MD Medical Director of Neuro-Oncology [facility] 10/24/2024 11:25 AM

## 2024-10-25 ENCOUNTER — Telehealth: Payer: Self-pay | Admitting: Internal Medicine

## 2024-10-25 NOTE — Telephone Encounter (Signed)
 Scheduled patient for next appointment. Called and spoke with the patient, she is aware.

## 2024-10-28 ENCOUNTER — Other Ambulatory Visit: Payer: Self-pay | Admitting: Internal Medicine

## 2024-10-28 DIAGNOSIS — R569 Unspecified convulsions: Secondary | ICD-10-CM

## 2024-11-15 ENCOUNTER — Encounter: Payer: Self-pay | Admitting: Radiation Oncology

## 2024-11-20 ENCOUNTER — Other Ambulatory Visit: Payer: Self-pay | Admitting: Internal Medicine

## 2024-11-20 DIAGNOSIS — R569 Unspecified convulsions: Secondary | ICD-10-CM

## 2024-12-06 ENCOUNTER — Telehealth: Payer: Self-pay

## 2024-12-06 NOTE — Telephone Encounter (Signed)
LM for pt with recommendations.

## 2024-12-06 NOTE — Telephone Encounter (Signed)
 T/C from pt stating she had 2 seizures yesterday. She went to the ED and was advised to call the office. She is not sure if she took her morning dose of Keppra  but she did take the evening dose.  She has been under a lot of stress at home with one of her children and putting her mother in a nursing facility. She is feeling better today but wasn't sure if she needed to increase her dose. Please advise

## 2024-12-10 ENCOUNTER — Telehealth: Payer: Self-pay

## 2024-12-10 ENCOUNTER — Telehealth: Payer: Self-pay | Admitting: Internal Medicine

## 2024-12-10 NOTE — Telephone Encounter (Signed)
 T/C from pt stating she was told to call the office today but is not sure why.  She called yesterday and spoke to the on call nurse and was told her Keppra  level was 47.6  She has not had any more seizures but is having dizziness and nausea.  Please advise on pt's next step

## 2024-12-10 NOTE — Telephone Encounter (Signed)
 Scheduled appointment per 1/27 scheduling message. Talked with the patient and she is aware of the made appointments.

## 2024-12-13 ENCOUNTER — Telehealth: Payer: Self-pay | Admitting: *Deleted

## 2024-12-13 NOTE — Telephone Encounter (Signed)
 [name] [name] called office with request to reschedule appt on Monday 12/16/24 due to travel concerns r/t predicted snow. Appt r/s to 2/5 @ 11a - patient in agreement.

## 2024-12-16 ENCOUNTER — Inpatient Hospital Stay: Admitting: Internal Medicine

## 2024-12-19 ENCOUNTER — Inpatient Hospital Stay: Attending: Radiation Oncology | Admitting: Internal Medicine

## 2024-12-19 DIAGNOSIS — R569 Unspecified convulsions: Secondary | ICD-10-CM

## 2024-12-19 DIAGNOSIS — D320 Benign neoplasm of cerebral meninges: Secondary | ICD-10-CM

## 2024-12-19 MED ORDER — DEXAMETHASONE 4 MG PO TABS: 4.0000 mg | ORAL_TABLET | Freq: Every day | ORAL | 0 refills | Status: AC

## 2024-12-19 NOTE — Progress Notes (Signed)
 I connected with [name] [name] on 12/19/24 at 11:00 AM EST by telephone visit and verified that I am speaking with the correct person using two identifiers.   I discussed the limitations, risks, security and privacy concerns of performing an evaluation and management service by telemedicine and the availability of in-person appointments. I also discussed with the patient that there may be a patient responsible charge related to this service. The patient expressed understanding and agreed to proceed.   Other persons participating in the visit and their role in the encounter:  n/a   Patient's location:  Home Provider's location:  Office Chief Complaint:  Recurrent meningioma of the brain [facility])  Seizure (HCC)  History of Present Ilness: [name] [name] reports breakthrough seizure in late January, ED visit in [place]. She attributes this to family stress she was experiencing at the time.  Since the seizures on 12/05/24, she has felt more tired, dizzy, even weak on the left, compared to normal. Previously she had recovered from seizures after 1-2 days, but this has persisted now for two weeks.  Continues on the Keppra  1000mg  twice per day.   Observations: Language and cognition at baseline  Assessment and Plan: Recurrent meningioma of the brain [facility])  Seizure (HCC)  [name] [name] has clinical complaints today which may be secondary to prolonged post-ictal phase, or iatrogenic effects of anti-epileptic, Keppra .  Drug level was slightly above the normal range when checked in the ED last month.  Previously, the 1000mg  dose level had been well tolerated.    It's possible there could be strucutural changes or inflammation within the CNS, treated meningioma.    Recommended trial of dexamethasone , 4mg  daily, for reported symptoms.  Will keep Keppra  at 1000mg  BID for now given recent breakthrough seizures.  Follow Up Instructions: Will call her in 1 week to assess response to short course of steroids.   She is agreeable with this.  I discussed the assessment and treatment plan with the patient.  The patient was provided an opportunity to ask questions and all were answered.  The patient agreed with the plan and demonstrated understanding of the instructions.    The patient was advised to call back or seek an in-person evaluation if the symptoms worsen or if the condition fails to improve as anticipated.    [name], MD   I provided 20 minutes of non face-to-face telephone visit time during this encounter, and > 50% was spent counseling as documented under my assessment & plan.

## 2025-01-17 ENCOUNTER — Encounter (HOSPITAL_COMMUNITY)

## 2025-01-17 ENCOUNTER — Ambulatory Visit

## 2025-02-21 ENCOUNTER — Other Ambulatory Visit

## 2025-02-24 ENCOUNTER — Inpatient Hospital Stay

## 2025-02-27 ENCOUNTER — Inpatient Hospital Stay: Admitting: Internal Medicine
